# Patient Record
Sex: Male | Born: 1955 | Race: White | Hispanic: No | Marital: Married | Smoking: Former smoker
Health system: Southern US, Community
[De-identification: ages and names within clinical notes are randomized; demographics above are authoritative.]

## PROBLEM LIST (undated history)

## (undated) DIAGNOSIS — I7789 Other specified disorders of arteries and arterioles: Secondary | ICD-10-CM

## (undated) DIAGNOSIS — G4730 Sleep apnea, unspecified: Secondary | ICD-10-CM

## (undated) DIAGNOSIS — Z9889 Other specified postprocedural states: Secondary | ICD-10-CM

## (undated) DIAGNOSIS — G8929 Other chronic pain: Secondary | ICD-10-CM

## (undated) DIAGNOSIS — IMO0001 Reserved for inherently not codable concepts without codable children: Secondary | ICD-10-CM

## (undated) DIAGNOSIS — M1990 Unspecified osteoarthritis, unspecified site: Secondary | ICD-10-CM

## (undated) DIAGNOSIS — R000 Tachycardia, unspecified: Secondary | ICD-10-CM

## (undated) DIAGNOSIS — K219 Gastro-esophageal reflux disease without esophagitis: Secondary | ICD-10-CM

## (undated) DIAGNOSIS — I719 Aortic aneurysm of unspecified site, without rupture: Secondary | ICD-10-CM

## (undated) DIAGNOSIS — I10 Essential (primary) hypertension: Secondary | ICD-10-CM

## (undated) DIAGNOSIS — E785 Hyperlipidemia, unspecified: Secondary | ICD-10-CM

## (undated) DIAGNOSIS — E291 Testicular hypofunction: Secondary | ICD-10-CM

## (undated) DIAGNOSIS — M549 Dorsalgia, unspecified: Secondary | ICD-10-CM

## (undated) DIAGNOSIS — T7840XA Allergy, unspecified, initial encounter: Secondary | ICD-10-CM

## (undated) HISTORY — DX: Reserved for inherently not codable concepts without codable children: IMO0001

## (undated) HISTORY — DX: Sleep apnea, unspecified: G47.30

## (undated) HISTORY — PX: CARDIAC CATHETERIZATION: SHX172

## (undated) HISTORY — DX: Other specified postprocedural states: Z98.890

## (undated) HISTORY — DX: Other chronic pain: G89.29

## (undated) HISTORY — DX: Allergy, unspecified, initial encounter: T78.40XA

## (undated) HISTORY — DX: Testicular hypofunction: E29.1

## (undated) HISTORY — PX: POLYPECTOMY: SHX149

## (undated) HISTORY — PX: COLONOSCOPY: SHX174

## (undated) HISTORY — DX: Aortic aneurysm of unspecified site, without rupture: I71.9

## (undated) HISTORY — DX: Unspecified osteoarthritis, unspecified site: M19.90

## (undated) HISTORY — DX: Hyperlipidemia, unspecified: E78.5

## (undated) HISTORY — DX: Gastro-esophageal reflux disease without esophagitis: K21.9

## (undated) HISTORY — DX: Tachycardia, unspecified: R00.0

## (undated) HISTORY — DX: Dorsalgia, unspecified: M54.9

## (undated) HISTORY — DX: Other specified disorders of arteries and arterioles: I77.89

## (undated) HISTORY — DX: Essential (primary) hypertension: I10

## (undated) HISTORY — PX: CYSTECTOMY: SUR359

---

## 2003-03-04 ENCOUNTER — Encounter: Admission: RE | Admit: 2003-03-04 | Discharge: 2003-03-04 | Payer: Self-pay | Admitting: Internal Medicine

## 2003-11-03 ENCOUNTER — Encounter: Admission: RE | Admit: 2003-11-03 | Discharge: 2003-11-03 | Payer: Self-pay | Admitting: Internal Medicine

## 2003-11-04 ENCOUNTER — Encounter: Admission: RE | Admit: 2003-11-04 | Discharge: 2003-11-04 | Payer: Self-pay | Admitting: Internal Medicine

## 2004-06-02 ENCOUNTER — Ambulatory Visit: Payer: Self-pay | Admitting: Internal Medicine

## 2004-06-08 ENCOUNTER — Ambulatory Visit: Payer: Self-pay

## 2005-04-20 ENCOUNTER — Ambulatory Visit: Payer: Self-pay | Admitting: Internal Medicine

## 2005-05-02 ENCOUNTER — Ambulatory Visit: Payer: Self-pay | Admitting: Internal Medicine

## 2005-08-01 ENCOUNTER — Ambulatory Visit: Payer: Self-pay | Admitting: Internal Medicine

## 2006-04-24 ENCOUNTER — Ambulatory Visit: Payer: Self-pay | Admitting: Internal Medicine

## 2006-04-24 LAB — CONVERTED CEMR LAB
BUN: 19 mg/dL (ref 6–23)
Basophils Absolute: 0.1 10*3/uL (ref 0.0–0.1)
Basophils Relative: 1 % (ref 0.0–1.0)
CO2: 32 meq/L (ref 19–32)
Calcium: 8.8 mg/dL (ref 8.4–10.5)
Chloride: 103 meq/L (ref 96–112)
Cholesterol: 151 mg/dL (ref 0–200)
Creatinine, Ser: 0.9 mg/dL (ref 0.4–1.5)
Creatinine,U: 612.1 mg/dL
Eosinophils Absolute: 0.1 10*3/uL (ref 0.0–0.6)
Eosinophils Relative: 1.1 % (ref 0.0–5.0)
GFR calc Af Amer: 116 mL/min
GFR calc non Af Amer: 96 mL/min
Glucose, Bld: 127 mg/dL — ABNORMAL HIGH (ref 70–99)
HCT: 40.8 % (ref 39.0–52.0)
HDL: 41 mg/dL (ref >39.0)
Hemoglobin: 14.5 g/dL (ref 13.0–17.0)
Hgb A1c MFr Bld: 7.1 % — ABNORMAL HIGH (ref 4.6–6.0)
LDL Cholesterol: 90 mg/dL (ref 0–99)
Lymphocytes Relative: 12.9 % (ref 12.0–46.0)
MCHC: 35.5 g/dL (ref 30.0–36.0)
MCV: 91.8 fL (ref 78.0–100.0)
Microalb Creat Ratio: 3.8 mg/g (ref 0.0–30.0)
Microalb, Ur: 2.3 mg/dL — ABNORMAL HIGH (ref 0.0–1.9)
Monocytes Absolute: 1.4 10*3/uL — ABNORMAL HIGH (ref 0.2–0.7)
Monocytes Relative: 18 % — ABNORMAL HIGH (ref 3.0–11.0)
Neutro Abs: 4.9 10*3/uL (ref 1.4–7.7)
Neutrophils Relative %: 67 % (ref 43.0–77.0)
PSA: 0.5 ng/mL (ref 0.10–4.00)
Platelets: 242 10*3/uL (ref 150–400)
Potassium: 3.9 meq/L (ref 3.5–5.1)
RBC: 4.44 M/uL (ref 4.22–5.81)
RDW: 11.6 % (ref 11.5–14.6)
Sodium: 142 meq/L (ref 135–145)
TSH: 0.77 microintl units/mL (ref 0.35–5.50)
Total CHOL/HDL Ratio: 3.7
Triglycerides: 101 mg/dL (ref 0–149)
VLDL: 20 mg/dL (ref 0–40)
WBC: 7.5 10*3/uL (ref 4.5–10.5)

## 2006-04-30 ENCOUNTER — Ambulatory Visit: Payer: Self-pay | Admitting: Internal Medicine

## 2006-05-06 ENCOUNTER — Ambulatory Visit: Payer: Self-pay | Admitting: Internal Medicine

## 2006-05-06 LAB — CONVERTED CEMR LAB
Basophils Relative: 0.7 % (ref 0.0–1.0)
CO2: 30 meq/L (ref 19–32)
Calcium: 9 mg/dL (ref 8.4–10.5)
Eosinophils Absolute: 0.1 10*3/uL (ref 0.0–0.6)
Eosinophils Relative: 1.2 % (ref 0.0–5.0)
GFR calc Af Amer: 116 mL/min
GFR calc non Af Amer: 96 mL/min
Glucose, Bld: 165 mg/dL — ABNORMAL HIGH (ref 70–99)
HCT: 39.3 % (ref 39.0–52.0)
Hemoglobin: 13.6 g/dL (ref 13.0–17.0)
Lymphocytes Relative: 23.8 % (ref 12.0–46.0)
MCV: 90.9 fL (ref 78.0–100.0)
Neutro Abs: 3.8 10*3/uL (ref 1.4–7.7)
Neutrophils Relative %: 65.3 % (ref 43.0–77.0)
Prothrombin Time: 12.3 s (ref 10.0–14.0)
Sodium: 141 meq/L (ref 135–145)
WBC: 5.8 10*3/uL (ref 4.5–10.5)

## 2006-05-10 ENCOUNTER — Inpatient Hospital Stay (HOSPITAL_BASED_OUTPATIENT_CLINIC_OR_DEPARTMENT_OTHER): Admission: RE | Admit: 2006-05-10 | Discharge: 2006-05-10 | Payer: Self-pay | Admitting: Internal Medicine

## 2006-05-10 ENCOUNTER — Ambulatory Visit: Payer: Self-pay | Admitting: Internal Medicine

## 2006-05-15 ENCOUNTER — Ambulatory Visit: Payer: Self-pay

## 2006-05-15 ENCOUNTER — Encounter: Payer: Self-pay | Admitting: Cardiology

## 2006-05-22 ENCOUNTER — Ambulatory Visit: Payer: Self-pay | Admitting: Internal Medicine

## 2006-07-18 DIAGNOSIS — I10 Essential (primary) hypertension: Secondary | ICD-10-CM | POA: Insufficient documentation

## 2006-07-18 DIAGNOSIS — E785 Hyperlipidemia, unspecified: Secondary | ICD-10-CM | POA: Insufficient documentation

## 2006-07-22 ENCOUNTER — Encounter: Payer: Self-pay | Admitting: Internal Medicine

## 2006-07-22 ENCOUNTER — Ambulatory Visit: Payer: Self-pay | Admitting: Internal Medicine

## 2006-07-22 DIAGNOSIS — E119 Type 2 diabetes mellitus without complications: Secondary | ICD-10-CM | POA: Insufficient documentation

## 2006-07-22 DIAGNOSIS — K219 Gastro-esophageal reflux disease without esophagitis: Secondary | ICD-10-CM | POA: Insufficient documentation

## 2006-07-22 LAB — CONVERTED CEMR LAB
Microalb Creat Ratio: 4.5 mg/g (ref 0.0–30.0)
Microalb, Ur: 0.4 mg/dL (ref 0.0–1.9)

## 2006-11-18 ENCOUNTER — Ambulatory Visit: Payer: Self-pay | Admitting: Internal Medicine

## 2007-01-10 ENCOUNTER — Encounter (INDEPENDENT_AMBULATORY_CARE_PROVIDER_SITE_OTHER): Payer: Self-pay | Admitting: *Deleted

## 2007-02-17 ENCOUNTER — Ambulatory Visit: Payer: Self-pay | Admitting: Internal Medicine

## 2007-02-24 LAB — CONVERTED CEMR LAB
ALT: 24 units/L (ref 0–53)
AST: 17 units/L (ref 0–37)
BUN: 17 mg/dL (ref 6–23)
Calcium: 9.3 mg/dL (ref 8.4–10.5)
Chloride: 104 meq/L (ref 96–112)
GFR calc Af Amer: 115 mL/min
GFR calc non Af Amer: 95 mL/min
Glucose, Bld: 115 mg/dL — ABNORMAL HIGH (ref 70–99)

## 2007-06-16 ENCOUNTER — Telehealth (INDEPENDENT_AMBULATORY_CARE_PROVIDER_SITE_OTHER): Payer: Self-pay | Admitting: *Deleted

## 2007-06-23 ENCOUNTER — Ambulatory Visit: Payer: Self-pay | Admitting: Internal Medicine

## 2007-06-23 ENCOUNTER — Encounter (INDEPENDENT_AMBULATORY_CARE_PROVIDER_SITE_OTHER): Payer: Self-pay | Admitting: *Deleted

## 2007-09-08 ENCOUNTER — Encounter: Payer: Self-pay | Admitting: Internal Medicine

## 2007-10-24 ENCOUNTER — Encounter (INDEPENDENT_AMBULATORY_CARE_PROVIDER_SITE_OTHER): Payer: Self-pay | Admitting: Surgery

## 2007-10-24 ENCOUNTER — Ambulatory Visit (HOSPITAL_COMMUNITY): Admission: RE | Admit: 2007-10-24 | Discharge: 2007-10-24 | Payer: Self-pay | Admitting: Surgery

## 2007-10-28 ENCOUNTER — Encounter: Payer: Self-pay | Admitting: Internal Medicine

## 2007-12-24 ENCOUNTER — Ambulatory Visit: Payer: Self-pay | Admitting: Internal Medicine

## 2007-12-24 LAB — HM DIABETES FOOT EXAM

## 2007-12-25 ENCOUNTER — Encounter (INDEPENDENT_AMBULATORY_CARE_PROVIDER_SITE_OTHER): Payer: Self-pay | Admitting: *Deleted

## 2007-12-26 ENCOUNTER — Telehealth (INDEPENDENT_AMBULATORY_CARE_PROVIDER_SITE_OTHER): Payer: Self-pay | Admitting: *Deleted

## 2008-03-01 ENCOUNTER — Telehealth (INDEPENDENT_AMBULATORY_CARE_PROVIDER_SITE_OTHER): Payer: Self-pay | Admitting: *Deleted

## 2008-03-03 ENCOUNTER — Telehealth (INDEPENDENT_AMBULATORY_CARE_PROVIDER_SITE_OTHER): Payer: Self-pay | Admitting: *Deleted

## 2008-04-26 ENCOUNTER — Ambulatory Visit: Payer: Self-pay | Admitting: Internal Medicine

## 2008-04-27 ENCOUNTER — Ambulatory Visit: Payer: Self-pay | Admitting: Internal Medicine

## 2008-05-03 LAB — CONVERTED CEMR LAB
AST: 18 units/L (ref 0–37)
BUN: 24 mg/dL — ABNORMAL HIGH (ref 6–23)
Calcium: 9.2 mg/dL (ref 8.4–10.5)
GFR calc Af Amer: 115 mL/min
Glucose, Bld: 142 mg/dL — ABNORMAL HIGH (ref 70–99)
Sodium: 143 meq/L (ref 135–145)

## 2008-06-28 ENCOUNTER — Telehealth (INDEPENDENT_AMBULATORY_CARE_PROVIDER_SITE_OTHER): Payer: Self-pay | Admitting: *Deleted

## 2008-07-06 ENCOUNTER — Telehealth (INDEPENDENT_AMBULATORY_CARE_PROVIDER_SITE_OTHER): Payer: Self-pay | Admitting: *Deleted

## 2008-07-08 ENCOUNTER — Encounter (INDEPENDENT_AMBULATORY_CARE_PROVIDER_SITE_OTHER): Payer: Self-pay | Admitting: *Deleted

## 2008-07-27 ENCOUNTER — Telehealth: Payer: Self-pay | Admitting: Internal Medicine

## 2008-10-15 ENCOUNTER — Telehealth (INDEPENDENT_AMBULATORY_CARE_PROVIDER_SITE_OTHER): Payer: Self-pay | Admitting: *Deleted

## 2008-10-20 ENCOUNTER — Telehealth (INDEPENDENT_AMBULATORY_CARE_PROVIDER_SITE_OTHER): Payer: Self-pay | Admitting: *Deleted

## 2008-10-29 ENCOUNTER — Ambulatory Visit: Payer: Self-pay | Admitting: Internal Medicine

## 2008-11-03 ENCOUNTER — Encounter (INDEPENDENT_AMBULATORY_CARE_PROVIDER_SITE_OTHER): Payer: Self-pay | Admitting: *Deleted

## 2008-11-05 ENCOUNTER — Ambulatory Visit: Payer: Self-pay | Admitting: Internal Medicine

## 2008-11-10 LAB — CONVERTED CEMR LAB
Basophils Relative: 0.5 % (ref 0.0–3.0)
Eosinophils Relative: 4.1 % (ref 0.0–5.0)
HDL: 36.4 mg/dL — ABNORMAL LOW (ref >39.00)
Hemoglobin: 14.1 g/dL (ref 13.0–17.0)
Hgb A1c MFr Bld: 6.6 % — ABNORMAL HIGH (ref 4.6–6.5)
Lymphocytes Relative: 24.4 % (ref 12.0–46.0)
Monocytes Relative: 10.6 % (ref 3.0–12.0)
Neutro Abs: 3.7 10*3/uL (ref 1.4–7.7)
PSA: 0.65 ng/mL (ref 0.10–4.00)
RBC: 4.34 M/uL (ref 4.22–5.81)
TSH: 0.88 microintl units/mL (ref 0.35–5.50)
Total CHOL/HDL Ratio: 4
VLDL: 19.4 mg/dL (ref 0.0–40.0)

## 2008-12-03 ENCOUNTER — Ambulatory Visit: Payer: Self-pay | Admitting: Gastroenterology

## 2008-12-17 ENCOUNTER — Encounter: Payer: Self-pay | Admitting: Gastroenterology

## 2008-12-17 ENCOUNTER — Ambulatory Visit: Payer: Self-pay | Admitting: Gastroenterology

## 2008-12-20 ENCOUNTER — Encounter: Payer: Self-pay | Admitting: Gastroenterology

## 2008-12-20 ENCOUNTER — Telehealth (INDEPENDENT_AMBULATORY_CARE_PROVIDER_SITE_OTHER): Payer: Self-pay | Admitting: *Deleted

## 2008-12-27 ENCOUNTER — Telehealth: Payer: Self-pay | Admitting: Gastroenterology

## 2009-04-01 ENCOUNTER — Ambulatory Visit: Payer: Self-pay | Admitting: Internal Medicine

## 2009-04-07 LAB — CONVERTED CEMR LAB
CO2: 25 meq/L (ref 19–32)
Calcium: 8.9 mg/dL (ref 8.4–10.5)
Creatinine, Ser: 0.91 mg/dL (ref 0.40–1.50)
Glucose, Bld: 92 mg/dL (ref 70–99)
Sodium: 139 meq/L (ref 135–145)

## 2009-05-27 ENCOUNTER — Telehealth: Payer: Self-pay | Admitting: Internal Medicine

## 2009-06-22 ENCOUNTER — Ambulatory Visit: Payer: Self-pay | Admitting: Family Medicine

## 2009-06-22 ENCOUNTER — Encounter: Payer: Self-pay | Admitting: Internal Medicine

## 2009-06-22 DIAGNOSIS — J309 Allergic rhinitis, unspecified: Secondary | ICD-10-CM | POA: Insufficient documentation

## 2009-08-09 ENCOUNTER — Telehealth: Payer: Self-pay | Admitting: Internal Medicine

## 2009-11-10 ENCOUNTER — Encounter (INDEPENDENT_AMBULATORY_CARE_PROVIDER_SITE_OTHER): Payer: Self-pay | Admitting: *Deleted

## 2009-11-15 ENCOUNTER — Encounter (INDEPENDENT_AMBULATORY_CARE_PROVIDER_SITE_OTHER): Payer: Self-pay | Admitting: *Deleted

## 2009-12-29 ENCOUNTER — Encounter (INDEPENDENT_AMBULATORY_CARE_PROVIDER_SITE_OTHER): Payer: Self-pay | Admitting: *Deleted

## 2009-12-30 ENCOUNTER — Ambulatory Visit: Payer: Self-pay | Admitting: Gastroenterology

## 2010-01-13 ENCOUNTER — Ambulatory Visit: Payer: Self-pay | Admitting: Gastroenterology

## 2010-01-16 ENCOUNTER — Encounter: Payer: Self-pay | Admitting: Gastroenterology

## 2010-03-09 ENCOUNTER — Ambulatory Visit
Admission: RE | Admit: 2010-03-09 | Discharge: 2010-03-09 | Payer: Self-pay | Source: Home / Self Care | Attending: Internal Medicine | Admitting: Internal Medicine

## 2010-03-16 ENCOUNTER — Telehealth: Payer: Self-pay | Admitting: Internal Medicine

## 2010-03-24 ENCOUNTER — Ambulatory Visit
Admission: RE | Admit: 2010-03-24 | Discharge: 2010-03-24 | Payer: Self-pay | Source: Home / Self Care | Attending: Internal Medicine | Admitting: Internal Medicine

## 2010-03-24 ENCOUNTER — Other Ambulatory Visit: Payer: Self-pay | Admitting: Internal Medicine

## 2010-03-24 ENCOUNTER — Encounter: Payer: Self-pay | Admitting: Internal Medicine

## 2010-03-24 DIAGNOSIS — E291 Testicular hypofunction: Secondary | ICD-10-CM | POA: Insufficient documentation

## 2010-03-24 LAB — CBC WITH DIFFERENTIAL/PLATELET
Basophils Absolute: 0 10*3/uL (ref 0.0–0.1)
Basophils Relative: 0.5 % (ref 0.0–3.0)
Eosinophils Absolute: 0.1 10*3/uL (ref 0.0–0.7)
Eosinophils Relative: 1.1 % (ref 0.0–5.0)
HCT: 41.6 % (ref 39.0–52.0)
Hemoglobin: 14.3 g/dL (ref 13.0–17.0)
Lymphocytes Relative: 19.1 % (ref 12.0–46.0)
Lymphs Abs: 1.6 10*3/uL (ref 0.7–4.0)
MCHC: 34.5 g/dL (ref 30.0–36.0)
MCV: 92.4 fl (ref 78.0–100.0)
Monocytes Absolute: 0.7 10*3/uL (ref 0.1–1.0)
Monocytes Relative: 8.1 % (ref 3.0–12.0)
Neutro Abs: 6 10*3/uL (ref 1.4–7.7)
Neutrophils Relative %: 71.2 % (ref 43.0–77.0)
Platelets: 256 10*3/uL (ref 150.0–400.0)
RBC: 4.5 Mil/uL (ref 4.22–5.81)
RDW: 12.7 % (ref 11.5–14.6)
WBC: 8.5 10*3/uL (ref 4.5–10.5)

## 2010-03-24 LAB — LIPID PANEL
Cholesterol: 163 mg/dL (ref 0–200)
HDL: 41.7 mg/dL (ref >39.00)
LDL Cholesterol: 98 mg/dL (ref 0–99)
Total CHOL/HDL Ratio: 4
Triglycerides: 119 mg/dL (ref 0.0–149.0)
VLDL: 23.8 mg/dL (ref 0.0–40.0)

## 2010-03-24 LAB — PSA: PSA: 0.72 ng/mL (ref 0.10–4.00)

## 2010-03-24 LAB — HEPATIC FUNCTION PANEL
ALT: 26 U/L (ref 0–53)
AST: 22 U/L (ref 0–37)
Albumin: 4.3 g/dL (ref 3.5–5.2)
Alkaline Phosphatase: 60 U/L (ref 39–117)
Bilirubin, Direct: 0.1 mg/dL (ref 0.0–0.3)
Total Bilirubin: 0.8 mg/dL (ref 0.3–1.2)
Total Protein: 7.2 g/dL (ref 6.0–8.3)

## 2010-03-24 LAB — TSH: TSH: 0.61 u[IU]/mL (ref 0.35–5.50)

## 2010-03-29 ENCOUNTER — Encounter: Payer: Self-pay | Admitting: Internal Medicine

## 2010-03-29 LAB — CONVERTED CEMR LAB

## 2010-04-03 ENCOUNTER — Encounter: Payer: Self-pay | Admitting: Internal Medicine

## 2010-04-03 LAB — CONVERTED CEMR LAB
Sex Hormone Binding: 23 nmol/L (ref 13–71)
Testosterone Free: 51 pg/mL (ref 47.0–244.0)
Testosterone-% Free: 2.3 % (ref 1.6–2.9)
Testosterone: 219.42 ng/dL — ABNORMAL LOW (ref 250–890)

## 2010-04-09 LAB — CONVERTED CEMR LAB
ALT: 25 units/L (ref 0–53)
BUN: 21 mg/dL (ref 6–23)
Basophils Relative: 0.2 % (ref 0.0–1.0)
Creatinine, Ser: 0.9 mg/dL (ref 0.4–1.5)
Eosinophils Relative: 1.2 % (ref 0.0–5.0)
GFR calc Af Amer: 115 mL/min
Glucose, Bld: 139 mg/dL — ABNORMAL HIGH (ref 70–99)
Glucose, Urine, Semiquant: NEGATIVE
HCT: 42.9 % (ref 39.0–52.0)
Hemoglobin: 14 g/dL (ref 13.0–17.0)
MCV: 94.6 fL (ref 78.0–100.0)
Microalb Creat Ratio: 2.2 mg/g (ref 0.0–30.0)
Monocytes Absolute: 0.7 10*3/uL (ref 0.1–1.0)
Monocytes Relative: 8.3 % (ref 3.0–12.0)
Neutro Abs: 6.7 10*3/uL (ref 1.4–7.7)
Nitrite: NEGATIVE
PSA: 0.52 ng/mL (ref 0.10–4.00)
Platelets: 267 10*3/uL (ref 150–400)
Potassium: 4 meq/L (ref 3.5–5.1)
RBC: 4.53 M/uL (ref 4.22–5.81)
Specific Gravity, Urine: <1.005
TSH: 0.84 microintl units/mL (ref 0.35–5.50)
Total CHOL/HDL Ratio: 3.8
WBC Urine, dipstick: NEGATIVE
WBC: 9 10*3/uL (ref 4.5–10.5)
pH: 8.5

## 2010-04-11 NOTE — Letter (Signed)
Summary: Pre Visit Letter Revised  [facility]  [address] [place], [place] [address]   Phone: [phone]  Fax: [phone]        11/15/2009 MRN: [id number] [facility] [place] [address] [place], [place]  [address]             Procedure Date:  01/13/2010   Welcome to the Gastroenterology Division at [facility].    You are scheduled to see a nurse for your pre-procedure visit on 12/30/2009 at 9:00AM on the 3rd floor at [facility], [address]  We ask that you try to arrive at our office 15 minutes prior to your appointment time to allow for check-in.  Please take a minute to review the attached form.  If you answer "Yes" to one or more of the questions on the first page, we ask that you call the person listed at your earliest opportunity.  If you answer "No" to all of the questions, please complete the rest of the form and bring it to your appointment.    Your nurse visit will consist of discussing your medical and surgical history, your immediate family medical history, and your medications.   If you are unable to list all of your medications on the form, please bring the medication bottles to your appointment and we will list them.  We will need to be aware of both prescribed and over the counter drugs.  We will need to know exact dosage information as well.    Please be prepared to read and sign documents such as consent forms, a financial agreement, and acknowledgement forms.  If necessary, and with your consent, a friend or relative is welcome to sit-in on the nurse visit with you.  Please bring your insurance card so that we may make a copy of it.  If your insurance requires a referral to see a specialist, please bring your referral form from your primary care physician.  No co-pay is required for this nurse visit.     If you cannot keep your appointment, please call [phone] to cancel or reschedule prior to your appointment date.  This  allows [place] the opportunity to schedule an appointment for another patient in need of care.    Thank you for choosing [facility] for your medical needs.  We appreciate the opportunity to care for you.  Please visit [place] at our website  to learn more about our practice.  Sincerely, The Gastroenterology Division

## 2010-04-11 NOTE — Letter (Signed)
Summary: Colonoscopy Letter  [facility]  [address] [place], [place] [address]   Phone: [phone]  Fax: [phone]      November 10, 2009 MRN: [id number]   [facility] [address] [place], [place]  [address]   Dear Mr. Lenker,   According to your medical record, it is time for you to schedule a Colonoscopy. The American Cancer Society recommends this procedure as a method to detect early colon cancer. Patients with a family history of colon cancer, or a personal history of colon polyps or inflammatory bowel disease are at increased risk.  This letter has beeen generated based on the recommendations made at the time of your procedure. If you feel that in your particular situation this may no longer apply, please contact our office.  Please call our office at [phone] to schedule this appointment or to update your records at your earliest convenience.  Thank you for cooperating with [place] to provide you with the very best care possible.   Sincerely,  Judie Petit T. Russella Dar, M.D.  [facility] Gastroenterology Division [phone]

## 2010-04-11 NOTE — Letter (Signed)
Summary: Moviprep Instructions  [facility]  [address]   [place], [place] [address]   Phone: [phone]  Fax: [phone]       Blayke Bies    08/07/1968    MRN: [id number]        Procedure Day /Date: Friday, 01-13-10     Arrival Time: 8:00 a.m.     Procedure Time: 9:00 a.m.     Location of Procedure:                    x   [facility] (4th Floor)                        PREPARATION FOR COLONOSCOPY WITH MOVIPREP   Starting 5 days prior to your procedure 01-08-10 do not eat nuts, seeds, popcorn, corn, beans, peas,  salads, or any raw vegetables.  Do not take any fiber supplements (e.g. Metamucil, Citrucel, and Benefiber).  THE DAY BEFORE YOUR PROCEDURE         DATE: 01-12-10   DAY: Thursday  1.  Drink clear liquids the entire day-NO SOLID FOOD  2.  Do not drink anything colored red or purple.  Avoid juices with pulp.  No orange juice.  3.  Drink at least 64 oz. (8 glasses) of fluid/clear liquids during the day to prevent dehydration and help the prep work efficiently.  CLEAR LIQUIDS INCLUDE: Water Jello Ice Popsicles Tea (sugar ok, no milk/cream) Powdered fruit flavored drinks Coffee (sugar ok, no milk/cream) Gatorade Juice: apple, white grape, white cranberry  Lemonade Clear bullion, consomm, broth Carbonated beverages (any kind) Strained chicken noodle soup Hard Candy                             4.  In the morning, mix first dose of MoviPrep solution:    Empty 1 Pouch A and 1 Pouch B into the disposable container    Add lukewarm drinking water to the top line of the container. Mix to dissolve    Refrigerate (mixed solution should be used within 24 hrs)  5.  Begin drinking the prep at 5:00 p.m. The MoviPrep container is divided by 4 marks.   Every 15 minutes drink the solution down to the next mark (approximately 8 oz) until the full liter is complete.   6.  Follow completed prep with 16 oz of clear liquid of your choice  (Nothing red or purple).  Continue to drink clear liquids until bedtime.  7.  Before going to bed, mix second dose of MoviPrep solution:    Empty 1 Pouch A and 1 Pouch B into the disposable container    Add lukewarm drinking water to the top line of the container. Mix to dissolve    Refrigerate  THE DAY OF YOUR PROCEDURE      DATE: 01-13-10  DAY: Friday  Beginning at 4:00 a.m. (5 hours before procedure):         1. Every 15 minutes, drink the solution down to the next mark (approx 8 oz) until the full liter is complete.  2. Follow completed prep with 16 oz. of clear liquid of your choice.    3. You may drink clear liquids until 7:00  (2 HOURS BEFORE PROCEDURE).   MEDICATION INSTRUCTIONS  Unless otherwise instructed, you should take regular prescription medications with a small sip of water   as early as possible the  morning of your procedure.  Diabetic patients - see separate instructions.   Additional medication instructions:  Hold HCTZ pill morning of procedure         OTHER INSTRUCTIONS  You will need a responsible adult at least 53 years of age to accompany you and drive you home.   This person must remain in the waiting room during your procedure.  Wear loose fitting clothing that is easily removed.  Leave jewelry and other valuables at home.  However, you may wish to bring a book to read or  an iPod/MP3 player to listen to music as you wait for your procedure to start.  Remove all body piercing jewelry and leave at home.  Total time from sign-in until discharge is approximately 2-3 hours.  You should go home directly after your procedure and rest.  You can resume normal activities the  day after your procedure.  The day of your procedure you should not:   Drive   Make legal decisions   Operate machinery   Drink alcohol   Return to work  You will receive specific instructions about eating, activities and medications before you leave.    The  above instructions have been reviewed and explained to me by  Sherren Kerns RN  December 30, 2009 9:18 AM     I fully understand and can verbalize these instructions _____________________________ Date _________

## 2010-04-11 NOTE — Progress Notes (Signed)
Summary: Refill Request  Phone Note Refill Request Call back at [phone] Message from:  Pharmacy on Aug 09, 2009 8:53 AM  Refills Requested: Medication #1:  AMBIEN 10 MG TABS 1 by mouth at bedtime as needed   Dosage confirmed as above?Dosage Confirmed   Brand Name Necessary? No   [facility]ed[facility] [address]  Next Appointment Scheduled: 6.10.11 Initial call taken by: Harold Barban,  Aug 09, 2009 8:53 AM  Follow-up for Phone Call        #30 with 6 refills on 12/21/08.....Marland KitchenMarland KitchenShary Decamp  Aug 09, 2009 1:02 PM  ok 30 and 3 RF Jose E. Paz MD  Aug 09, 2009 2:17 PM     Prescriptions: AMBIEN 10 MG TABS (ZOLPIDEM TARTRATE) 1 by mouth at bedtime as needed  #30 x 3   Entered by:   Shary Decamp   Authorized by:   Nolon Rod. Paz MD   Signed by:   Shary Decamp on 08/09/2009   Method used:   Printed then faxed to ...       [facility] [phone]* (retail)       [address]       [place], [place]  [address]       Ph: [phone]       Fax: [phone]   RxID:   [id number]

## 2010-04-11 NOTE — Procedures (Signed)
Summary: Colonoscopy   Colonoscopy  Procedure date:  12/17/2008  Findings:      Location:  [facility].    Procedures Next Due Date:    Colonoscopy: 12/2009 COLONOSCOPY PROCEDURE REPORT  PATIENT:  Anthony, [name]  MR#:  [id number] BIRTHDATE:   06-13-1955, 53 yrs. old   GENDER:   male  ENDOSCOPIST:   Judie Petit T. Russella Dar, MD, [facility] Referred by: Willow Ora, M.D.  PROCEDURE DATE:  12/17/2008 PROCEDURE:  Colonoscopy with snare polypectomy, and with submucosal injection ASA CLASS:   Class II INDICATIONS: 1) Routine Risk Screening   MEDICATIONS:    Fentanyl 50 mcg IV, Versed 8 mg IV  DESCRIPTION OF PROCEDURE:   After the risks benefits and alternatives of the procedure were thoroughly explained, informed consent was obtained.  Digital rectal exam was performed and revealed no abnormalities.   The LB PCF-H180AL [id number] endoscope was introduced through the anus and advanced to the cecum, which was identified by both the appendix and ileocecal valve, without limitations.  The quality of the prep was good, using MoviPrep.  The instrument was then slowly withdrawn as the colon was fully examined. <<PROCEDUREIMAGES>>              <<OLD IMAGES>>  FINDINGS:  A sessile polyp was found at the hepatic flexure. It was 2 cm in size and oblong along a fold. IPolyp was snared, then cauterized with monopolar cautery. Piecemeal polypectomy. Not sure if all of the polyp was removed/cauterized. Retrieval was successful. Submucosal injection of 4 cc on [place] Ink in 2 locations adjancent to the polyp. A normal appearing cecum, ileocecal valve, and appendiceal orifice were identified. The ascending, transverse, splenic flexure, descending, sigmoid colon, and rectum appeared unremarkable.   Retroflexed views in the rectum revealed no abnormalities.   The time to cecum =  3  minutes. The scope was then withdrawn (time =  17.5  min) from the patient and the procedure completed.  COMPLICATIONS:    None  ENDOSCOPIC IMPRESSION:  1) 2 cm sessile polyp at the hepatic flexure  RECOMMENDATIONS:  1) No aspirin or NSAID's for 2 weeks  2) Await pathology results  3) Repeat Colonoscopy in 1 year to assess if polypectomy was complete.     Venita Lick. Russella Dar, MD, [facility]        REPORT OF SURGICAL PATHOLOGY   Case #: [id number] Patient Name: Anthony [name], Anthony [name]. Office Chart Number:  N/A [id number] MRN: [id number] Pathologist: Beulah Gandy. Luisa Hart, MD DOB/Age  19-Jul-1955 (Age: 55)    Gender: M Date Taken:  12/17/2008 Date Received: 12/17/2008   FINAL DIAGNOSIS   ***MICROSCOPIC EXAMINATION AND DIAGNOSIS***   COLON, HEPATIC FLEXURE POLYP:  FRAGMENTS OF TUBULAR ADENOMA.  NO HIGH GRADE DYSPLASIA OR MALIGNANCY IDENTIFIED.   gdt Date Reported:  12/20/2008     Beulah Gandy. Luisa Hart, MD *** Electronically Signed Out By JDP ***     December 20, 2008 MRN: [id number]    [facility] [address] [place], [place]  [address]    Dear Mr. Hazzard,  I am pleased to inform you that the colon polyp(s) removed during your recent colonoscopy was (were) found to be benign (no cancer detected) upon pathologic examination.  I recommend you have a repeat colonoscopy examination in 1 year to look for recurrent polyps and assess if polypectomy was complete, as having colon polyps increases your risk for having recurrent polyps or even colon cancer in the future.  Should you develop new or worsening symptoms  of abdominal pain, bowel habit changes or bleeding from the rectum or bowels, please schedule an evaluation with either your primary care physician or with me.  Continue treatment plan as outlined the day of your exam.  Please call us if you are having persistent problems or have questions about your condition that have not been fully answered at this time.  Sincerely,  Meryl Dare MD [facility]  This letter has been electronically signed by your physician.    This report was created from the  original endoscopy report, which was reviewed and signed by the above listed endoscopist.

## 2010-04-11 NOTE — Letter (Signed)
Summary: Patient Notice- Polyp Results  [facility]  [address] [place], [place] [address]   Phone: [phone]  Fax: [phone]        January 16, 2010 MRN: [id number]    [facility] [address] [place], [place]  [address]    Dear Mr. Walczyk,  I am pleased to inform you that the colon polyp(s) removed during your recent colonoscopy was (were) found to be benign (no cancer detected) upon pathologic examination.  I recommend you have a repeat colonoscopy examination in 3 years to look for recurrent polyps, as having colon polyps increases your risk for having recurrent polyps or even colon cancer in the future.  Should you develop new or worsening symptoms of abdominal pain, bowel habit changes or bleeding from the rectum or bowels, please schedule an evaluation with either your primary care physician or with me.  Continue treatment plan as outlined the day of your exam.  Please call us if you are having persistent problems or have questions about your condition that have not been fully answered at this time.  Sincerely,  Meryl Dare MD [facility]  This letter has been electronically signed by your physician.  Appended Document: Patient Notice- Polyp Results letter mailed

## 2010-04-11 NOTE — Miscellaneous (Signed)
Summary: previsit prep/rm  Clinical Lists Changes  Medications: Added new medication of MOVIPREP 100 GM  SOLR (PEG-KCL-NACL-NASULF-NA ASC-C) As per prep instructions. - Signed Rx of MOVIPREP 100 GM  SOLR (PEG-KCL-NACL-NASULF-NA ASC-C) As per prep instructions.;  #1 x 0;  Signed;  Entered by: Sherren Kerns RN;  Authorized by: Meryl Dare MD [facility];  Method used: [facility] [phone]*, [address] [place], [place], [place]  [address], Ph: [phone], Fax: [phone] Observations: Added new observation of ALLERGY REV: Done (12/30/2009 8:43)    Prescriptions: MOVIPREP 100 GM  SOLR (PEG-KCL-NACL-NASULF-NA ASC-C) As per prep instructions.  #1 x 0   Entered by:   Sherren Kerns RN   Authorized by:   Meryl Dare MD [facility]   Signed by:   Sherren Kerns RN on 12/30/2009   Method used:   Electronically to        [facility] [phone]* (retail)       [address]       [place], [place]  [address]       Ph: [phone]       Fax: [phone]   RxID:   [id number]

## 2010-04-11 NOTE — Procedures (Signed)
Summary: Colonoscopy  Patient: Quentyn Kolbeck Note: All result statuses are Final unless otherwise noted.  Tests: (1) Colonoscopy (COL)   COL Colonoscopy           DONE     [facility]     [address]     [place], [place]  [address]           COLONOSCOPY PROCEDURE REPORT     PATIENT:  [name], Anthony [name]  MR#:  [id number]     BIRTHDATE:  September 02, 1955, 54 yrs. old  GENDER:  male     ENDOSCOPIST:  Judie Petit T. Russella Dar, MD, [facility]           PROCEDURE DATE:  01/13/2010     PROCEDURE:  Colonoscopy with snare polypectomy     ASA CLASS:  Class II     INDICATIONS:  1) surveillance and high-risk screening  2) history     of adenomatous colon polyps     MEDICATIONS:   Fentanyl 75 mcg IV, Versed 6 mg IV     DESCRIPTION OF PROCEDURE:   After the risks benefits and     alternatives of the procedure were thoroughly explained, informed     consent was obtained.  Digital rectal exam was performed and     revealed no abnormalities.   The LB PCF-H180AL [id number] endoscope     was introduced through the anus and advanced to the cecum, which     was identified by both the appendix and ileocecal valve, without     limitations.  The quality of the prep was good, using MoviPrep.     The instrument was then slowly withdrawn as the colon was fully     examined.     <<PROCEDUREIMAGES>>     FINDINGS:  A sessile polyp was found at the hepatic flexure. It     was 6 mm in size. Tattoo was noted at the polyp site. Polyp was     snared without cautery. Retrieval was successful. A normal     appearing cecum, ileocecal valve, and appendiceal orifice were     identified. The ascending, transverse, splenic flexure,     descending, sigmoid colon, and rectum appeared unremarkable.     Retroflexed views in the rectum revealed internal hemorrhoids,     small.  The time to cecum =  2.75  minutes. The scope was then     withdrawn (time =  11  min) from the patient and the procedure     completed.     COMPLICATIONS:  None             ENDOSCOPIC IMPRESSION:     1) 6 mm sessile polyp with tattoo at the hepatic flexure     2) Internal hemorrhoids     RECOMMENDATIONS:     1) Await pathology results     2) Repeat Colonoscopy in 3 years.           Venita Lick. Russella Dar, MD, [facility]           CC: Willow Ora, MD           n.     Rosalie DoctorVenita Lick. Ketina Mars at 01/13/2010 09:45 AM           Havens, Amada Jupiter, [id number]  Note: An exclamation mark (!) indicates a result that was not dispersed into the flowsheet. Document Creation Date: 01/13/2010 9:45 AM _______________________________________________________________________  (1) Order result status: Final Collection or observation date-time: 01/13/2010  09:30 Requested date-time:  Receipt date-time:  Reported date-time:  Referring Physician:   Ordering Physician: Claudette Head [id number]) Specimen Source:  Source: Launa Grill Order Number: [id number] Lab site:   Appended Document: Colonoscopy     Procedures Next Due Date:    Colonoscopy: 01/2013

## 2010-04-11 NOTE — Assessment & Plan Note (Signed)
Summary: 4 MTH FU/NS/KDC   Vital Signs:  Patient profile:   55 year old male Height:      75 inches Weight:      271.4 pounds Pulse rate:   74 / minute BP sitting:   130 / 90  Vitals Entered By: Shary Decamp (April 01, 2009 2:57 PM) CC: rov - fasting Is Patient Diabetic? Yes Comments  - patient has not been checking blood sugars @ home [facility]  April 01, 2009 3:01 PM    History of Present Illness: AODM -- the patient remains very active, nor recent CBGs HYPERLIPIDEMIA  -- good medication compliance  HYPERTENSION  -- seldom check  ambulatory BPs but usually ok    Current Medications (verified): 1)  Hydrochlorothiazide 25 Mg Tabs (Hydrochlorothiazide) .... Take 1 Tab Qd 2)  Baby Aspirin 81 Mg Chew (Aspirin) .... Take 1 Tablet By Mouth Once A Day 3)  Actos 45 Mg Tabs (Pioglitazone Hcl) .Marland Kitchen.. 1 Tablet By Mouth Once A Day 4)  Vytorin 10-40 Mg  Tabs (Ezetimibe-Simvastatin) .... Take One Half Tab Daily 5)  Cozaar 100 Mg  Tabs (Losartan Potassium) .... 1/2 Tab Qd 6)  Januvia 100 Mg Tabs (Sitagliptin Phosphate) .Marland Kitchen.. 1 By Mouth Qd 7)  Onetouch Ultra Test  Strp (Glucose Blood) .... Use One Strip Twice Daily. 8)  Ambien 10 Mg Tabs (Zolpidem Tartrate) .Marland Kitchen.. 1 By Mouth At Bedtime As Needed  Allergies (verified): No Known Drug Allergies  Past History:  Past Medical History: AODM  HYPERLIPIDEMIA   HYPERTENSION   GERD  DEGENERATIVE JOINT DISEASE per MRI of LS spine 2005 (-) stress test 2003, 2006 Cardiac cath 04-2006 neg per pt--CP improved w/ PPIs BACK PAIN, CHRONIC--much better as of 2009  Past Surgical History: Reviewed history from 10/29/2008 and no changes required. s/p cyst removal from the back  (2009)  Social History: Reviewed history from 10/29/2008 and no changes required. 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 Resp:  Denies cough and shortness of breath.  Physical Exam  General:  alert and well-developed.   Lungs:  normal respiratory effort, no intercostal retractions, no accessory muscle use, and normal breath sounds.   Heart:  normal rate, regular rhythm, no murmur, and no gallop.     Impression & Recommendations:  Problem # 1:  AODM (ICD-250.00) due for labs,cont present care  His updated medication list for this problem includes:    Baby Aspirin 81 Mg Chew (Aspirin) .Marland Kitchen... Take 1 tablet by mouth once a day    Actos 45 Mg Tabs (Pioglitazone hcl) .Marland Kitchen... 1 tablet by mouth once a day    Cozaar 100 Mg Tabs (Losartan potassium) .Marland Kitchen... 1/2 tab qd    Januvia 100 Mg Tabs (Sitagliptin phosphate) .Marland Kitchen... 1 by mouth qd  Labs Reviewed: Creat: 0.9 (04/27/2008)     Last Eye Exam: normal (04/12/2008) Reviewed HgBA1c results: 6.6 (11/05/2008)  7.0 (04/27/2008)  Problem # 2:  HYPERTENSION (ICD-401.9) BP slightlyelevated today, but usually okay.  Seen instructions, labs His updated medication list for this problem includes:    Hydrochlorothiazide 25 Mg Tabs (Hydrochlorothiazide) .Marland Kitchen... Take 1 tab qd    Cozaar 100 Mg Tabs (Losartan potassium) .Marland Kitchen... 1/2 tab qd  Orders: Venipuncture ([address])  BP today: 130/90 Prior BP: 110/70 (10/29/2008)  Labs Reviewed: K+: 4.0 (04/27/2008) Creat: :  0.9 (04/27/2008)   Chol: 135 (11/05/2008)   HDL: 36.40 (11/05/2008)   LDL: 79 (11/05/2008)   TG: 97.0 (11/05/2008)  Complete Medication List: 1)  Hydrochlorothiazide 25 Mg Tabs (Hydrochlorothiazide) .... Take 1 tab qd 2)  Baby Aspirin 81 Mg Chew (Aspirin) .... Take 1 tablet by mouth once a day 3)  Actos 45 Mg Tabs (Pioglitazone hcl) .Marland Kitchen.. 1 tablet by mouth once a day 4)  Vytorin 10-40 Mg Tabs (Ezetimibe-simvastatin) .... Take one half tab daily 5)  Cozaar 100 Mg Tabs (Losartan potassium) .... 1/2 tab qd 6)  Januvia 100 Mg Tabs (Sitagliptin phosphate) .Marland Kitchen.. 1 by mouth  qd 7)  Onetouch Ultra Test Strp (Glucose blood) .... Use one strip twice daily. 8)  Ambien 10 Mg Tabs (Zolpidem tartrate) .Marland Kitchen.. 1 by mouth at bedtime as needed  Patient Instructions: 1)  Please schedule a follow-up appointment in 4 months .  2)  Check your blood pressure 2 or 3 times a week. If it is more than 140/85 consistently,please let us know    Immunization History:  Influenza Immunization History:    Influenza:  historical (12/10/2008)

## 2010-04-11 NOTE — Miscellaneous (Signed)
Summary: neg DM eye exam   Clinical Lists Changes  Observations: Added new observation of DMEYEEXAMNXT: 06/2010 (06/22/2009 15:01) Added new observation of DMEYEEXMRES: normal (06/22/2009 15:01) Added new observation of EYE EXAM BY: digby (06/22/2009 15:01) Added new observation of DIAB EYE EX: normal (06/22/2009 15:01)       Diabetes Management Exam:    Eye Exam:       Eye Exam done elsewhere          Date: 06/22/2009          Results: normal          Done by: digby

## 2010-04-11 NOTE — Letter (Signed)
Summary: Diabetic Instructions  [facility]  [address]   [place], [place] [address]   Phone: [phone]  Fax: [phone]    Liahm Monahan 1955-06-18 MRN: [id number]    x    ORAL DIABETIC MEDICATION INSTRUCTIONS  The day before your procedure:   Take your diabetic pill as you do normally  The day of your procedure:   Do not take your diabetic pill    We will check your blood sugar levels during the admission process and again in Recovery before discharging you home  ________________________________________________________________________

## 2010-04-11 NOTE — Letter (Signed)
Summary: [facility]--no  retinopathy  [facility]   Imported By: Lanelle Bal 06/28/2009 09:20:09  _____________________________________________________________________  External Attachment:    Type:   Image     Comment:   External Document

## 2010-04-11 NOTE — Assessment & Plan Note (Signed)
Summary: FOR EAR INFECTION--PH   Vital Signs:  Patient profile:   55 year old male Weight:      272 pounds Temp:     97.1 degrees F oral BP sitting:   112 / 76  (left arm)  Vitals Entered By: Doristine Devoid (June 22, 2009 2:10 PM) CC: pressure in both ears for few weeks Comments -went to urgent care sometime ago given meds says they helped but not sure what was given    History of Present Illness: 55 yo man here today for ear pressure.  sxs most noticeable over the last 3-4 days.  no drainage.  pain worst in the AM.  had ear infxn a few weeks ago and had unnamed abx and nasal spray.  no fevers, sore throat, nasal congestion.  Current Medications (verified): 1)  Hydrochlorothiazide 25 Mg Tabs (Hydrochlorothiazide) .... Take 1 Tab Qd 2)  Baby Aspirin 81 Mg Chew (Aspirin) .... Take 1 Tablet By Mouth Once A Day 3)  Actos 45 Mg Tabs (Pioglitazone Hcl) .Marland Kitchen.. 1 Tablet By Mouth Once A Day 4)  Vytorin 10-40 Mg  Tabs (Ezetimibe-Simvastatin) .... Take One Half Tab Daily 5)  Cozaar 100 Mg  Tabs (Losartan Potassium) .... 1/2 Tab Qd 6)  Januvia 100 Mg Tabs (Sitagliptin Phosphate) .Marland Kitchen.. 1 By Mouth Qd 7)  Onetouch Ultra Test  Strp (Glucose Blood) .... Use One Strip Twice Daily. 8)  Ambien 10 Mg Tabs (Zolpidem Tartrate) .Marland Kitchen.. 1 By Mouth At Bedtime As Needed 9)  Pantoprazole Sodium 40 Mg Tbec (Pantoprazole Sodium) .... Take 1 Tab Once Daily 10)  Fluticasone Propionate 50 Mcg/act  Susp (Fluticasone Propionate) .... 2 Sprays Each Nostril Once Daily  Allergies (verified): No Known Drug Allergies  Review of Systems      See HPI  Physical Exam  General:  alert and well-developed.   Head:  NCAT, no TTP over sinuses Eyes:  no injxn or inflammation Ears:  TMs retracted bilaterally Nose:  marked turbinate edema Mouth:  +PND Neck:  no LAD   Impression & Recommendations:  Problem # 1:  RHINITIS (ICD-477.9) Assessment New pt's ear sxs are likely eustachian tube dysfxn caused by seasonal allergies.   continue nasal spray, add OTC antihistamine and short term decongestant.  pt to report if sxs don't improve.  no evidence of infxn- no need for abx. His updated medication list for this problem includes:    Fluticasone Propionate 50 Mcg/act Susp (Fluticasone propionate) .Marland Kitchen... 2 sprays each nostril once daily  Complete Medication List: 1)  Hydrochlorothiazide 25 Mg Tabs (Hydrochlorothiazide) .... Take 1 tab qd 2)  Baby Aspirin 81 Mg Chew (Aspirin) .... Take 1 tablet by mouth once a day 3)  Actos 45 Mg Tabs (Pioglitazone hcl) .Marland Kitchen.. 1 tablet by mouth once a day 4)  Vytorin 10-40 Mg Tabs (Ezetimibe-simvastatin) .... Take one half tab daily 5)  Cozaar 100 Mg Tabs (Losartan potassium) .... 1/2 tab qd 6)  Januvia 100 Mg Tabs (Sitagliptin phosphate) .Marland Kitchen.. 1 by mouth qd 7)  Onetouch Ultra Test Strp (Glucose blood) .... Use one strip twice daily. 8)  Ambien 10 Mg Tabs (Zolpidem tartrate) .Marland Kitchen.. 1 by mouth at bedtime as needed 9)  Pantoprazole Sodium 40 Mg Tbec (Pantoprazole sodium) .... Take 1 tab once daily 10)  Fluticasone Propionate 50 Mcg/act Susp (Fluticasone propionate) .... 2 sprays each nostril once daily  Patient Instructions: 1)  There is no evidence of infection in your ears 2)  This is likely all related to seasonal allergies 3)  Continue the nasal spray daily 4)  Start short term Sudafed or Coricidin HBP- no more than 5-7 days 5)  Add a Claritin or Zyrtec daily during allergy season 6)  Hang in there! Prescriptions: FLUTICASONE PROPIONATE 50 MCG/ACT  SUSP (FLUTICASONE PROPIONATE) 2 sprays each nostril once daily  #1 x 3   Entered and Authorized by:   Neena Rhymes MD   Signed by:   Neena Rhymes MD on 06/22/2009   Method used:   [facility]        [phone][facility]retail[facility]       [address]       [place], [place]  [address]       Ph: [phone]       Fax: [phone]   RxID:   [id number]

## 2010-04-11 NOTE — Progress Notes (Signed)
Summary: med concerns  Phone Note From Pharmacy   Caller[facility] Summary of Call: [facility] left VM  requesting a refill on pantoprazole 40mg  1 tab qd  . call back at [phone] opt 2 reference #[phone]  Follow-up for Phone Call        spoke with pt rx originally rx by cardiologist dr. Gala Romney for heart burn. pt states that he contact them first but was told that his PCP needs to rx med.Pls advise ok with filling med for pt.............Marland KitchenFelecia Deloach CMA  May 27, 2009 4:49 PM   Additional Follow-up for Phone Call Additional follow up Details #1::        ok to RF x 6 months  Additional Follow-up by: Denisha Hoel E. Isidora Laham MD,  May 28, 2009 7:42 PM    New/Updated Medications: PANTOPRAZOLE SODIUM 40 MG TBEC (PANTOPRAZOLE SODIUM) Take 1 tab once daily Prescriptions: PANTOPRAZOLE SODIUM 40 MG TBEC (PANTOPRAZOLE SODIUM) Take 1 tab once daily  #90 x 1   Entered by:   Jeremy Johann CMA   Authorized by:   Nolon Rod. Ishmail Mcmanamon MD   Signed by:   Jeremy Johann CMA on 05/30/2009   Method used:   Telephoned to ...       [facility]* (mail-order)             ,          Ph: [phone]       Fax: [phone]   RxID:   [id number]

## 2010-04-13 NOTE — Assessment & Plan Note (Signed)
Summary: FASTING CPX/KB   Vital Signs:  Patient profile:   55 year old male Weight:      269 pounds Pulse rate:   78 / minute Pulse rhythm:   regular BP sitting:   126 / 86  (left arm) Cuff size:   large  Vitals Entered By: Army Fossa CMA (March 24, 2010 11:30 AM) CC: CPX, fasting  Comments feeling pressure in [facility] [address]   History of Present Illness: CPX  last year, he was feeling really tired, went to another practitioner, was diagnosed with hypogonadism. He took hormonal shots for a few months, discontinued shots 5 months ago. Feels slightly tired again. see  review of systems  Preventive Screening-Counseling & Management  Caffeine-Diet-Exercise     Does Patient Exercise: yes  Current Medications (verified): 1)  Hydrochlorothiazide 25 Mg Tabs (Hydrochlorothiazide) .... Take 1 Tab Qd 2)  Baby Aspirin 81 Mg Chew (Aspirin) .... Take 1 Tablet By Mouth Once A Day 3)  Actos 45 Mg Tabs (Pioglitazone Hcl) .Marland Kitchen.. 1 Tablet By Mouth Once A Day 4)  Vytorin 10-40 Mg  Tabs (Ezetimibe-Simvastatin) .... Take One Half Tab Daily 5)  Cozaar 100 Mg  Tabs (Losartan Potassium) .... 1/2 Tab Qd 6)  Januvia 100 Mg Tabs (Sitagliptin Phosphate) .Marland Kitchen.. 1 By Mouth Qd 7)  Onetouch Ultra Test  Strp (Glucose Blood) .... Use One Strip Twice Daily. 8)  Ambien 10 Mg Tabs (Zolpidem Tartrate) .Marland Kitchen.. 1 By Mouth At Bedtime As Needed 9)  Pantoprazole Sodium 40 Mg Tbec (Pantoprazole Sodium) .... Take 1 Tab Once Daily  Allergies (verified): No Known Drug Allergies  Past History:  Past Medical History: AODM  HYPERLIPIDEMIA   HYPERTENSION   GERD  DEGENERATIVE JOINT DISEASE per MRI of LS spine 2005 (-) stress test 2003, 2006 Cardiac cath 04-2006 neg per pt--CP improved w/ PPIs BACK PAIN, CHRONIC--much better as of 2009 dx w/ hypogonadism elsewhere  ~ 2011  Past Surgical History: Reviewed history from 10/29/2008 and no changes required. 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 CV:  Denies chest pain or discomfort and swelling of feet. Resp:  Denies cough and wheezing. GI:  Denies bloody stools, nausea, and vomiting. GU:  Denies urinary frequency and urinary hesitancy. Endo:  occasionally " hot flashes ", no assoc. HA-CP libido--ok no ED .  Physical Exam  General:  alert and well-developed.   Neck:  no masses, no thyromegaly, and normal carotid upstroke.   Lungs:  normal respiratory effort, no intercostal retractions, and no accessory muscle use.  few rhonchi with cough Heart:  normal rate, regular rhythm, no murmur, and no gallop.   Abdomen:  soft, non-tender, no distention, no masses, no guarding, and no rigidity.   Rectal:  No external abnormalities noted. Normal sphincter tone. No rectal masses or tenderness. Prostate:  Prostate gland firm and smooth, no enlargement, nodularity, tenderness, mass, asymmetry or induration. Pulses:  normal pedal pulses bilaterally  Extremities:  no pretibial edema bilaterally  Psych:  Oriented X3, memory intact for  recent and remote, normally interactive, good eye contact, not anxious appearing, and not depressed appearing.     Impression & Recommendations:  Problem # 1:  HEALTH SCREENING (ICD-V70.0) Td  10-2008 recommend a flu shot at his pharmacy  [facility] 11-11, next 01-2013   diet  and exercise discussed Labs  Orders: Venipuncture (54098) TLB-CBC Platelet - w/Differential (85025-CBCD) TLB-Hepatic/Liver Function Pnl  (80076-HEPATIC) TLB-PSA (Prostate Specific Antigen) (84153-PSA) TLB-TSH (Thyroid Stimulating Hormone) (84443-TSH) T- * Misc. Laboratory test [phone])  Problem # 2:  HYPOGONADISM (ICD-257.2) Assessment: New diagnosed with hypogonadism a year ago, took hormones for a few months. Off HRT for 5 months.  Request labs.  Complete Medication List: 1)  Hydrochlorothiazide 25 Mg Tabs (Hydrochlorothiazide) .... Take 1 tab qd 2)  Baby Aspirin 81 Mg Chew (Aspirin) .... Take 1 tablet by mouth once a day 3)  Actos 45 Mg Tabs (Pioglitazone hcl) .Marland Kitchen.. 1 tablet by mouth once a day 4)  Vytorin 10-40 Mg Tabs (Ezetimibe-simvastatin) .... Take one half tab daily 5)  Cozaar 100 Mg Tabs (Losartan potassium) .... 1/2 tab qd 6)  Januvia 100 Mg Tabs (Sitagliptin phosphate) .Marland Kitchen.. 1 by mouth qd 7)  Onetouch Ultra Test Strp (Glucose blood) .... Use one strip twice daily. 8)  Ambien 10 Mg Tabs (Zolpidem tartrate) .Marland Kitchen.. 1 by mouth at bedtime as needed 9)  Pantoprazole Sodium 40 Mg Tbec (Pantoprazole sodium) .... Take 1 tab once daily  Other Orders: TLB-Lipid Panel (80061-LIPID) Specimen Handling (78295)  Patient Instructions: 1)  Please schedule a follow-up appointment in 3 months .    Orders Added: 1)  Venipuncture [36415] 2)  TLB-CBC Platelet - w/Differential [85025-CBCD] 3)  TLB-Hepatic/Liver Function Pnl [80076-HEPATIC] 4)  TLB-Lipid Panel [80061-LIPID] 5)  TLB-PSA (Prostate Specific Antigen) [84153-PSA] 6)  TLB-TSH (Thyroid Stimulating Hormone) [84443-TSH] 7)  Specimen Handling [99000] 8)  T- * Misc. Laboratory test [99999] 9)  Est. Patient age 40-64 [96] 35)  Est. Patient Level II [99212]     Risk Factors:  Alcohol use:  yes Exercise:  yes

## 2010-04-13 NOTE — Progress Notes (Signed)
Summary: Check on pt  ---- Converted from flag ---- ---- 03/15/2010 4:12 PM, Army Fossa CMA wrote: left message for pt.  ---- 03/09/2010 2:48 PM, Jose E. Paz MD wrote: please check on the patient, better? ------------------------------  I spoke with the patient and he is much better, he is sure he will be completely fine by the time he finishes his ABX. [name] CMA  March 16, 2010 8:16 AM

## 2010-04-13 NOTE — Assessment & Plan Note (Signed)
Summary: sinus/bronchitis/dizziness//kn   Vital Signs:  Patient profile:   55 year old male Height:      75 inches Weight:      266.13 pounds BMI:     33.38 Temp:     97.9 degrees F oral Pulse rate:   78 / minute Pulse rhythm:   regular BP sitting:   138 / 88  (left arm) Cuff size:   large  Vitals Entered By: Army Fossa CMA (March 09, 2010 10:02 AM) CC: Pt here c/o HA, nausea, dizzy.  Comments started at 430 am [facility]  fasting    History of Present Illness: last week had cough, sinus - chest congestion, chills He felt better this week. This morning he woke up and he was lightheaded, had mild headache (frontal). + nausea and started vomiting  at the time of this visit overall dizzines  is decreasing and  is feeling slightly better after he vomited   Review of systems  + subjective fever No diplopia, no slurred speech no diarrhea still has chest congestion    Current Medications (verified): 1)  Hydrochlorothiazide 25 Mg Tabs (Hydrochlorothiazide) .... Take 1 Tab Qd 2)  Baby Aspirin 81 Mg Chew (Aspirin) .... Take 1 Tablet By Mouth Once A Day 3)  Actos 45 Mg Tabs (Pioglitazone Hcl) .Marland Kitchen.. 1 Tablet By Mouth Once A Day 4)  Vytorin 10-40 Mg  Tabs (Ezetimibe-Simvastatin) .... Take One Half Tab Daily 5)  Cozaar 100 Mg  Tabs (Losartan Potassium) .... 1/2 Tab Qd 6)  Januvia 100 Mg Tabs (Sitagliptin Phosphate) .Marland Kitchen.. 1 By Mouth Qd 7)  Onetouch Ultra Test  Strp (Glucose Blood) .... Use One Strip Twice Daily. 8)  Ambien 10 Mg Tabs (Zolpidem Tartrate) .Marland Kitchen.. 1 By Mouth At Bedtime As Needed 9)  Pantoprazole Sodium 40 Mg Tbec (Pantoprazole Sodium) .... Take 1 Tab Once Daily  Allergies (verified): No Known Drug Allergies  Past History:  Past Medical History: Reviewed history from 04/01/2009 and no changes required. AODM  HYPERLIPIDEMIA   HYPERTENSION   GERD  DEGENERATIVE JOINT DISEASE per MRI of LS spine 2005 (-) stress test 2003, 2006 Cardiac cath 04-2006 neg per  pt--CP improved w/ PPIs BACK PAIN, CHRONIC--much better as of 2009  Past Surgical History: Reviewed history from 10/29/2008 and no changes required. s/p cyst removal from the back  (2009)  Social History: Reviewed history from 10/29/2008 and no changes required. 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  nontoxic Head:  face symmetric, tender @  both maxillary sinuses Eyes:  EOMI. Pupils equal and reactive Ears:  tympanic membranes bulged R>L , slightly red. No discharge Nose:  congested Mouth:  no redness or discharge Lungs:  normal respiratory effort, no intercostal retractions, and no accessory muscle use.  few rhonchi with cough Heart:  normal rate, regular rhythm, no murmur, and no gallop.   Neurologic:  alert & oriented X3, cranial nerves II-XII intact, strength normal in all extremities, and gait normal.  Romberg absent   Impression & Recommendations:  Problem # 1:  DIZZINESS (ICD-780.4)  dizziness, nausea, vomiting in the setting of acute sinusitis. Neurological exam normal Will treat sinusitis with antibiotics plus symptomatic treatment ----> see instructions  If no better, he needs to call.  His updated medication list for this problem includes:    Promethazine Hcl 12.5 Mg Tabs (Promethazine  hcl) ..... One or 2 every 6 hours as needed for nausea  Problem # 2:  SINUSITIS - ACUTE-NOS (ICD-461.9) see #1     Complete Medication List: 1)  Hydrochlorothiazide 25 Mg Tabs (Hydrochlorothiazide) .... Take 1 tab qd 2)  Baby Aspirin 81 Mg Chew (Aspirin) .... Take 1 tablet by mouth once a day 3)  Actos 45 Mg Tabs (Pioglitazone hcl) .Marland Kitchen.. 1 tablet by mouth once a day 4)  Vytorin 10-40 Mg Tabs (Ezetimibe-simvastatin) .... Take one half tab daily 5)  Cozaar 100 Mg Tabs (Losartan potassium) .... 1/2 tab qd 6)   Januvia 100 Mg Tabs (Sitagliptin phosphate) .Marland Kitchen.. 1 by mouth qd 7)  Onetouch Ultra Test Strp (Glucose blood) .... Use one strip twice daily. 8)  Ambien 10 Mg Tabs (Zolpidem tartrate) .Marland Kitchen.. 1 by mouth at bedtime as needed 9)  Pantoprazole Sodium 40 Mg Tbec (Pantoprazole sodium) .... Take 1 tab once daily 10)  Amoxicillin 500 Mg Tabs (Amoxicillin) .... 2 by mouth twice a day for 10 days 11)  Promethazine Hcl 12.5 Mg Tabs (Promethazine hcl) .... One or 2 every 6 hours as needed for nausea  Patient Instructions: 1)  rest, fluids, Tylenol 2)  Mucinex DM as needed for  cough 3)  Start amoxicillin 4)  Phenergan as needed for nausea 5)  if you feel a lot worse, high fever, increased dizziness or headache---->  go to the ER 6)  call if not improving in a few days Prescriptions: PROMETHAZINE HCL 12.5 MG TABS (PROMETHAZINE HCL) One or 2 every 6 hours as needed for nausea  #20 x 0   Entered and Authorized by:   Nolon Rod. Paz MD   Signed by:   Nolon Rod. Paz MD on 03/09/2010   Method used:   [facility]        [facility] [phone][facility] (retail)       [address]       [place], [place]  [address]       Ph: [phone]       Fax: [phone]   RxID:   [id number] AMOXICILLIN 500 MG TABS (AMOXICILLIN) 2 by mouth twice a day for 10 days  #40 x 0   Entered and Authorized by:   Nolon Rod. Paz MD   Signed by:   Nolon Rod. Paz MD on 03/09/2010   Method used:   Electronically to        [facility] [phone]* (retail)       [address]       [place], [place]  [address]       Ph: [phone]       Fax: [phone]   RxID:   [id number]    Orders Added: 1)  Est. Patient Level III [30160]

## 2010-04-14 ENCOUNTER — Telehealth: Payer: Self-pay | Admitting: Internal Medicine

## 2010-04-19 NOTE — Progress Notes (Addendum)
Summary: Labs request  Phone Note Call from Patient   Summary of Call: Patient left message on triage requesting a copy of his labs.  I spoke with the patient and he will come by up front later and have the front staff print it for him. Initial call taken by: [name] CMA,  April 14, 2010 9:34 AM     Appended Document: Labs request pt came in requesting a copy of most recent labs. labs printed and given to patient .[name]  April 28, 2010 2:40 PM   Thanks. [name] CMA  April 28, 2010 2:41 PM

## 2010-05-23 LAB — GLUCOSE, CAPILLARY: Glucose-Capillary: 123 mg/dL — ABNORMAL HIGH (ref 70–99)

## 2010-05-26 ENCOUNTER — Encounter: Payer: Self-pay | Admitting: Internal Medicine

## 2010-05-26 ENCOUNTER — Other Ambulatory Visit: Payer: Self-pay | Admitting: Internal Medicine

## 2010-05-26 ENCOUNTER — Ambulatory Visit (INDEPENDENT_AMBULATORY_CARE_PROVIDER_SITE_OTHER): Payer: BC Managed Care – PPO | Admitting: Internal Medicine

## 2010-05-26 DIAGNOSIS — I10 Essential (primary) hypertension: Secondary | ICD-10-CM

## 2010-05-26 DIAGNOSIS — E119 Type 2 diabetes mellitus without complications: Secondary | ICD-10-CM

## 2010-05-26 DIAGNOSIS — E291 Testicular hypofunction: Secondary | ICD-10-CM

## 2010-05-26 LAB — BASIC METABOLIC PANEL
BUN: 21 mg/dL (ref 6–23)
Creatinine, Ser: 0.9 mg/dL (ref 0.4–1.5)
GFR: 93.93 mL/min (ref >60.00)
Potassium: 3.9 mEq/L (ref 3.5–5.1)

## 2010-05-26 LAB — HEMOGLOBIN A1C: Hgb A1c MFr Bld: 6.9 % — ABNORMAL HIGH (ref 4.6–6.5)

## 2010-05-30 NOTE — Assessment & Plan Note (Signed)
Summary: 3 month ov/ph   Vital Signs:  Patient profile:   55 year old male Weight:      268.38 pounds Pulse rate:   82 / minute Pulse rhythm:   regular BP sitting:   124 / 86  (left arm) Cuff size:   large  Vitals Entered By: Army Fossa CMA (May 26, 2010 11:04 AM) CC: 3 month f/u- fasting  [facility]    History of Present Illness: doing well all previous labs reviewed w/ patient  ROS ambulatory CBGs in the 130-140, posprandial sometimes 180 occasionally in the 90 range, rarely w/  symptoms , no near syncope  no dizzines no LE edema  diet-- good most of the time, packing his lunch   Current Medications (verified): 1)  Hydrochlorothiazide 25 Mg Tabs (Hydrochlorothiazide) .... Take 1 Tab Qd 2)  Baby Aspirin 81 Mg Chew (Aspirin) .... Take 1 Tablet By Mouth Once A Day 3)  Actos 45 Mg Tabs (Pioglitazone Hcl) .Marland Kitchen.. 1 Tablet By Mouth Once A Day 4)  Vytorin 10-40 Mg  Tabs (Ezetimibe-Simvastatin) .... Take One Half Tab Daily 5)  Cozaar 100 Mg  Tabs (Losartan Potassium) .... 1/2 Tab Qd 6)  Januvia 100 Mg Tabs (Sitagliptin Phosphate) .Marland Kitchen.. 1 By Mouth Qd 7)  Onetouch Ultra Test  Strp (Glucose Blood) .... Use One Strip Twice Daily. 8)  Ambien 10 Mg Tabs (Zolpidem Tartrate) .Marland Kitchen.. 1 By Mouth At Bedtime As Needed 9)  Pantoprazole Sodium 40 Mg Tbec (Pantoprazole Sodium) .... Take 1 Tab Once Daily  Allergies (verified): No Known Drug Allergies  Past History:  Past Medical History: Reviewed history from 03/24/2010 and no changes required. AODM  HYPERLIPIDEMIA   HYPERTENSION   GERD  DEGENERATIVE JOINT DISEASE per MRI of LS spine 2005 (-) stress test 2003, 2006 Cardiac cath 04-2006 neg per pt--CP improved w/ PPIs BACK PAIN, CHRONIC--much better as of 2009 dx w/ hypogonadism elsewhere  ~ 2011  Past Surgical History: Reviewed history from 10/29/2008 and no changes required. s/p cyst removal from the back  (2009)  Physical Exam  General:  alert and well-developed.    Lungs:  normal respiratory effort, no intercostal retractions, and no accessory muscle use.  few rhonchi with cough Heart:  normal rate, regular rhythm, no murmur, and no gallop.     Impression & Recommendations:  Problem # 1:  HYPOGONADISM (ICD-257.2) labs discussed, recheck yearly  Problem # 2:  AODM (ICD-250.00) labs, diet -exercise discussed low sugar symptoms discussed, see instructions   His updated medication list for this problem includes:    Baby Aspirin 81 Mg Chew (Aspirin) .Marland Kitchen... Take 1 tablet by mouth once a day    Actos 45 Mg Tabs (Pioglitazone hcl) .Marland Kitchen... 1 tablet by mouth once a day    Januvia 100 Mg Tabs (Sitagliptin phosphate) .Marland Kitchen... 1 by mouth qd    Cozaar 100 Mg Tabs (Losartan potassium) .Marland Kitchen... 1/2 tab qd  Orders: Venipuncture (95621) TLB-A1C / Hgb A1C (Glycohemoglobin) (83036-A1C) Specimen Handling (30865)  Labs Reviewed: Creat: 0.91 (04/01/2009)     Last Eye Exam: normal (06/22/2009) Reviewed HgBA1c results: 7.1 (04/01/2009)  6.6 (11/05/2008)  Problem # 3:  HYPERTENSION (ICD-401.9) good ambulatory BPs  His updated medication list for this problem includes:    Hydrochlorothiazide 25 Mg Tabs (Hydrochlorothiazide) .Marland Kitchen... Take 1 tab qd    Cozaar 100 Mg Tabs (Losartan potassium) .Marland Kitchen... 1/2 tab qd  Orders: TLB-BMP (Basic Metabolic Panel-BMET) (80048-METABOL) Specimen Handling (78469)  BP today: 124/86 Prior BP: 126/86 (03/24/2010)  Labs Reviewed: K+: 3.7 (04/01/2009) Creat: : 0.91 (04/01/2009)   Chol: 163 (03/24/2010)   HDL: 41.70 (03/24/2010)   LDL: 98 (03/24/2010)   TG: 119.0 (03/24/2010)  Complete Medication List: 1)  Hydrochlorothiazide 25 Mg Tabs (Hydrochlorothiazide) .... Take 1 tab qd 2)  Baby Aspirin 81 Mg Chew (Aspirin) .... Take 1 tablet by mouth once a day 3)  Actos 45 Mg Tabs (Pioglitazone hcl) .Marland Kitchen.. 1 tablet by mouth once a day 4)  Januvia 100 Mg Tabs (Sitagliptin phosphate) .Marland Kitchen.. 1 by mouth qd 5)  Vytorin 10-40 Mg Tabs  (Ezetimibe-simvastatin) .... Take one half tab daily 6)  Cozaar 100 Mg Tabs (Losartan potassium) .... 1/2 tab qd 7)  Onetouch Ultra Test Strp (Glucose blood) .... Use one strip twice daily. 8)  Ambien 10 Mg Tabs (Zolpidem tartrate) .Marland Kitchen.. 1 by mouth at bedtime as needed 9)  Pantoprazole Sodium 40 Mg Tbec (Pantoprazole sodium) .... Take 1 tab once daily  Patient Instructions: 1)  Please schedule a follow-up appointment in 4 months .  2)  if your sugar are frecuently < 90, let me know    Orders Added: 1)  Venipuncture [36415] 2)  TLB-A1C / Hgb A1C (Glycohemoglobin) [83036-A1C] 3)  TLB-BMP (Basic Metabolic Panel-BMET) [80048-METABOL] 4)  Specimen Handling [99000] 5)  Est. Patient Level III [16109]

## 2010-06-05 ENCOUNTER — Telehealth: Payer: Self-pay | Admitting: Internal Medicine

## 2010-06-05 MED ORDER — GLUCOSE BLOOD VI STRP: ORAL_STRIP | Status: AC

## 2010-06-05 NOTE — Telephone Encounter (Signed)
Patient wants new prescription for One Touch Ultra Test Strips called into [facility]

## 2010-06-15 LAB — GLUCOSE, CAPILLARY: Glucose-Capillary: 131 mg/dL — ABNORMAL HIGH (ref 70–99)

## 2010-07-24 ENCOUNTER — Other Ambulatory Visit: Payer: Self-pay | Admitting: Internal Medicine

## 2010-07-25 NOTE — Op Note (Signed)
NAME:  Anthony [name], Anthony [name]                   ACCOUNT NO.:  [id number]   MEDICAL RECORD NO.:  [id number]          PATIENT TYPE:  AMB   LOCATION:  DAY                          FACILITY:  [facility]   PHYSICIAN:  Wilmon Arms. Corliss Skains, M.D. DATE OF BIRTH:  08-20-1955   DATE OF PROCEDURE:  10/24/2007  DATE OF DISCHARGE:                               OPERATIVE REPORT   PREOPERATIVE DIAGNOSIS:  Lipoma (8 cm) mid-back.   POSTOPERATIVE DIAGNOSIS:  Lipoma (7 cm) mid-back.   PROCEDURE PERFORMED:  Excision back lipoma.   SURGEON:  Wilmon Arms. Tsuei, M.D.   ANESTHESIA:  General endotracheal.   INDICATIONS:  The patient is a 55 year old male who presents with a  slowly enlarging mass in the middle of his back.  This is causing some  discomfort.  He is unable lie flat comfortably on his back.  He presents  now for removal of this area.   DESCRIPTION OF PROCEDURE:  The patient brought to the operating room and  placed in supine position on the stretcher.  After an adequate level of  general anesthesia was obtained, the patient was flipped to a prone  position on the operating room table.  Appropriate padding was placed.  His back was prepped with Betadine and draped in sterile fashion.  Time-  out was taken to ensure the proper patient, proper procedure.  Area over  the lipoma was infiltrated with 10 mL of 0.25% Marcaine.  A transverse  incision was made over the mass.  Dissection was carried down through  the subcutaneous tissues to the surface of the lipoma.  We dissected  around the mass in all directions.  We then dissected the lipoma  directly off of the muscles.  The specimen was sent for pathologic  examination.  The wound was then thoroughly irrigated, inspected for  hemostasis.  We placed a 10-French flat Blake drain through a stab  incision into the cavity.  The wound was closed with staples.  Dry  dressing was applied.  The drain was placed to bulb suction.  The  patient was then extubated, brought to  recovery in stable condition.  All sponge, instrument and needle counts were correct.      Wilmon Arms. Tsuei, M.D.  Electronically Signed     MKT/MEDQ  D:  10/24/2007  T:  10/24/2007  Job:  [id number]

## 2010-07-28 NOTE — Cardiovascular Report (Signed)
NAME:  Chirino, Laydon                   ACCOUNT NO.:  [id number]   MEDICAL RECORD NO.:  [id number]          PATIENT TYPE:  OIB   LOCATION:  1961                         FACILITY:  [facility]   PHYSICIAN:  Bevelyn Buckles. Bensimhon, MDDATE OF BIRTH:  [birth year]   DATE OF PROCEDURE:  05/10/2006  DATE OF DISCHARGE:                            CARDIAC CATHETERIZATION   DATE OF PROCEDURE:  May 10, 2006.   PRIMARY CARE PHYSICIAN:  primary care physician is Dr. Willow Ora.   IDENTIFICATION:  Mr. Portell is a very pleasant 55 year old male with  multiple cardiac risk factors including hypertension, hyperlipidemia,  diabetes and a strong family history.  He has had a somewhat atypical  chest pain for several years.  He had a stress Myoview in March of 2006  that showed an EF of 64% .  Given his risk factors and continuing pain,  we decided to pursue cardiac catheterization.  This was performed the  outpatient catheterization lab.   PROCEDURES PERFORMED:  1. Selective coronary angiography.  2. Left heart catheterization.  3. Left ventriculogram.   DESCRIPTION OF PROCEDURE:  The risks and benefits of the catheterization  were explained.  Consent was signed and placed in the chart.  A 4-French  sheath was placed in the right femoral artery using a modified Seldinger  technique.  Standard catheters including JL-4, 3-DRC and angled pigtail  were used for the procedure.  All catheter exchanges were made over  wire.  No apparent complications.   FINDINGS:  Central aortic pressure were 105/62 with a mean of 81.  LV  pressure was 111/4 with an EDP of 13.  No aortic stenosis.   Left main was normal.   LAD was a large vessel coursing to the apex.  It gave off three small  diagonal's.  Initially the mid-to-distal LAD seemed somewhat small,  perhaps a component of spasm.  We gave some intracoronary nitroglycerin  and this improved somewhat.   Left circumflex made up primarily of a large OM-1.  It was ectatic  but  no significant coronary disease.   Right coronary artery is a very large ectatic vessel that gave off a  large PDA and two posterolateral's.  There is TIMI II flow throughout  the vessel, question due to the size and ectasia, versus the recent  administration of intracoronary nitroglycerin.   Left ventriculogram.  This was done the RAO position and showed an EF of  50%.  There appeared to be mild apical ballooning and hypokinesis.  No  significant mitral regurgitation.   ASSESSMENT:  1. No significant epicardial coronary artery disease.  2. Questionable mild vasospasm in the left anterior descending.  3. Low normal ejection fraction with mild apical ballooning, question      of Takotsubo's cardiomyopathy.   PLAN:  We will proceed with medical therapy.  Unfortunately his heart  rate is too low to add a beta blocker, so we will add Imdur 30 mg a day.  I will see him back in clinic with an echocardiogram.      Bevelyn Buckles. Bensimhon, MD  Electronically  Signed     DRB/MEDQ  D:  05/10/2006  T:  05/10/2006  Job:  [id number]

## 2010-07-28 NOTE — Assessment & Plan Note (Signed)
[facility]                            CARDIOLOGY OFFICE NOTE   NAME:LONGLeslee, Haueter                          MRN:          [id number]  DATE:04/30/2006                            DOB:          17-Apr-1955    REASON FOR CONSULTATION:  Chest pain.   HISTORY OF PRESENT ILLNESS:  Mr. Rack is a very pleasant 55 -year-old  male with multiple cardiac risk factors including diabetes,  hypertension, hyperlipidemia, obesity and a strong family history of  coronary artery disease. He has had chest pain for at least five years.  He had a stress test in [place] in 2002, which was negative. More  recently in March of 2006, he also had a stress test here, which showed  an EF of 64% with minimal soft-tissue attenuation and no convincing  ischemia or infarct. Since that time, he continues to have somewhat  atypical chest pain. He says it is mostly when he is using his upper  body. He feels numbness and an uncomfortable feeling going up his left  arm through his chest and sometimes down his right arm. This lasts a  little bit after he does work and then goes away. There is no associated  shortness of breath, nausea, vomiting or diaphoresis. He is able to walk  on the treadmill for 20 minutes at a time at 3.5 miles per hour without  any chest pain. He does get some shortness of breath with his more  vigorous activities. He denies any orthopnea or PND. He had one episode  of chest pain at night about 2 or 3 months ago. He said he had a sharp  pain in the middle of his chest that woke him from sleep. There was no  associated symptoms. He was not sure if it was reflux or other heart  pain. He does have occasional pain in his calf muscles, but this Is not  reproducible. He is also swelling at the end of the day.   REVIEW OF SYSTEMS:  As per HPI and problem list. Also notable for  constipation, heavy snoring and severe daytime fatigue. Also, has  gastroesophageal reflux  disease and allergies.   PAST MEDICAL HISTORY:  1. History of chest pain.      a.     Status post two previous nuclear stress tests in 2002 and       March of 2006, both of which have been negative. Normal ejection       fraction.  2. Obesity.  3. Hypertension.  4. Hyperlipidemia.  5. Diabetes.  6. Gastroesophageal reflux disease.   CURRENT MEDICATIONS:  1. Cozaar 50 mg a day.  2. Hydrochlorothiazide daily.  3. Actos 45 mg half tablet a day.  4. Vytorin 10/40 half tablet a day.  5. Tetrabenazine 250 mg a day.  6. Piroxicam 20 mg a day.  7. Aspirin 81 a day.  8. Ambien 10 mg a day.   ALLERGIES:  No known drug allergies.   SOCIAL HISTORY:  He is married with two kids. Lives in [place]. Works  as  a truck Hospital doctor. Denies tobacco or occasional alcoholic drinks.   FAMILY HISTORY:  Mother is alive at 67. Father died at age 30 due to a  staph infection related to a bypass surgery. Brother is 108 years old and  has a history of coronary disease and had bypass surgery at age 7.   PHYSICAL EXAMINATION:  He is in no acute distress. He ambulates around  the clinic without any respiratory difficulty. Blood pressure is 108/76.  His weight is 271. Heart rate is 70.  HEENT: Sclerae anicteric. EOMI. There is no xanthelasma. Mucus membranes  are moist. Oropharynx is clear.  NECK: is supple. No JVD. Carotids are 2+ bilaterally without bruits.  There is no lymphadenopathy, thyromegaly.  CARDIAC: Is a regular rate and a rhythm with an S4. No murmur.  LUNGS:  Are clear.  ABDOMEN: Obese, nontender, nondistended. No hepatosplenomegaly. No  bruits. No masses appreciated. Good bowel sounds.  EXTREMITIES: Are warm with no cyanosis, clubbing or edema. No rashes.  Good distal pulses. DP pulses are 2+ bilaterally.  NEURO: alert and oriented x3. Cranial nerves II-XII are intact. Moves  all 4 extremities without difficulty. Affect is pleasant.   EKG shows a sinus rhythm at a rate of 70. There is a  first-degree AV  block, 202 milliseconds, otherwise axis and intervals are normal.  No ST-  T-wave changes.   ASSESSMENT/PLAN:  1. Chest pain. This is fairly atypical. However, he does have multiple      cardiac risk factors including a very strong family history. We had      a [place] talk about possible workups including repeating his stress      test or proceeding to cardiac catheterization. Given his anxiety      over his family history, we have made the decision to pursue with      cardiac catheterization to clearly define his coronary anatomy. I      also discussed with him the possibility that this may be related to      cervical disc disease. He said he has had problems with his neck in      the past.  2. Snoring and daytime somnolence. He clearly has obstructive sleep      apnea by history. I have asked him to followup with the      pulmonologist for a possible sleep study.  3. Hypertension, is well-controlled.  4. Hyperlipidemia, is followed by Dr. Drue Novel.  5. Diabetes, this is also followed by Dr. Drue Novel.   DISPOSITION:  Pending the results of his cardiac catheterization.     Bevelyn Buckles. Bensimhon, MD  Electronically Signed    DRB/MedQ  DD: 04/30/2006  DT: 04/30/2006  Job #: [id number]   cc:   Willow Ora, MD

## 2010-07-28 NOTE — Assessment & Plan Note (Signed)
[facility]                            CARDIOLOGY OFFICE NOTE   NAME:LONGGarry, Nicolini                          MRN:          [id number]  DATE:05/22/2006                            DOB:          18-Jan-1956    REFERRING PHYSICIAN:  Willow Ora, MD   INTERVAL HISTORY:  Mr. Winsett is a very pleasant 55 year old male with a  history of diabetes, hypertension, hyperlipidemia, and chest pain.  He  recently underwent cardiac catheterization, which showed normal coronary  arteries, except for some, perhaps, mild LAD vasospasm.  Ventriculography showed an EF of 50% with some apical hypokineses.  He  underwent followup echocardiogram which confirmed an EF of 50%-55% with  mild hypokinesis of the periapical wall.  There was some mild-to-  moderate aortic root dilatation, about 4.5 mm.  After his  catheterization, we started him on some Imdur for a question of spasm as  well as some Protonix.  He was unable to tolerate the Imdur after a week  due to severe, constant headaches.  He says that the Protonix has  essentially relieved his chest pain.  He is otherwise doing well, no  palpitations, no heart failure symptoms.  He is pending his sleep study.   CURRENT MEDICATIONS:  1. Cozaar 50 mg a day.  2. Hydrochlorothiazide 25 a day.  3. Actos 45 mg half tablet a day.  4. Vytorin 10/40.  5. Terbinafine 250 mg a day.  6. Aspirin 81.  7. Ambien 10.  8. Protonix 40.   He has no known drug allergies.   PHYSICAL EXAMINATION:  He is well-appearing, no acute distress.  Ambulates around the clinic without any respiratory difficulty.  Blood pressure is 119/90, heart rate is 69, weight is 273.  HEENT:  Sclerae anicteric, EOMI, there are no xanthelasma.  Mucous  membranes are moist.  Oropharynx clear.  NECK:  Supple, no JVD.  Carotids are 2+ bilaterally without any bruits.  There is no lymphadenopathy or thyromegaly.  CARDIAC:  Regular rate and rhythm without any murmurs, rubs,  or gallops.  LUNGS:  Clear.  ABDOMEN:  Obese, nontender, nondistended.  No hepatosplenomegaly, no  bruits, no masses.  Good bowel sounds.  EXTREMITIES:  Warm with no cyanosis, clubbing, or edema.  Good distal  pulses.  NEURO:  He is alert and oriented x3.  Cranial nerves II-XII are intact.  Moves all 4 extremities without difficulty.  Affect is very pleasant.   ASSESSMENT AND PLAN:  1. A low-normal left ventricular function.  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  We discussed the      possibility of a cardiac MRI to further sort this out, but he is      not sure he wants to proceed with that.  We will just go ahead and      check an echocardiogram in 3 months to further evaluate.  We will      start Toprol XL  25 mg a day and continue him on his Cozaar.  2. Chest pain.  I suspect this was mostly related to gastroesophageal      reflux disease, and this is much improved on the Protonix.  3. Hypertension.  Blood pressure relatively well controlled.      Diastolic is a bit up today, he will follow with Dr. Drue Novel.  4. Diabetes and hyperlipidemia, as per Dr. Drue Novel.     Bevelyn Buckles. Bensimhon, MD  Electronically Signed    DRB/MedQ  DD: 05/22/2006  DT: 05/24/2006  Job #: [id number]   cc:   Willow Ora, MD

## 2010-09-29 ENCOUNTER — Ambulatory Visit: Payer: BC Managed Care – PPO | Admitting: Internal Medicine

## 2010-10-06 ENCOUNTER — Ambulatory Visit: Payer: BC Managed Care – PPO | Admitting: Internal Medicine

## 2010-10-25 ENCOUNTER — Other Ambulatory Visit: Payer: Self-pay | Admitting: Internal Medicine

## 2010-10-25 NOTE — Telephone Encounter (Signed)
Rx Done. 3-mth supply to [facility] w/notation: Need Office Visit

## 2010-11-23 ENCOUNTER — Other Ambulatory Visit: Payer: Self-pay | Admitting: Internal Medicine

## 2010-11-23 MED ORDER — PIOGLITAZONE HCL 45 MG PO TABS: 45.0000 mg | ORAL_TABLET | Freq: Every day | ORAL | Status: DC

## 2010-11-23 NOTE — Telephone Encounter (Signed)
Patient's wife called to check status of refill request from [facility] for Actos---we dont have any record of receiving a prescription---pt is out of meds--please call [facility] and also send a 90 day supply prescription to [facility]

## 2010-11-23 NOTE — Telephone Encounter (Signed)
[facility] but not [facility]. Pt was due for a 4 month follow up July. Advised to schedule appointment before further refills

## 2010-12-08 LAB — DIFFERENTIAL
Eosinophils Relative: 1
Lymphocytes Relative: 24
Lymphs Abs: 1.5
Monocytes Relative: 10

## 2010-12-08 LAB — BASIC METABOLIC PANEL
BUN: 17
GFR calc Af Amer: >60
GFR calc non Af Amer: >60
Potassium: 3.8
Sodium: 137

## 2010-12-08 LAB — GLUCOSE, CAPILLARY
Glucose-Capillary: 135 — ABNORMAL HIGH
Glucose-Capillary: 150 — ABNORMAL HIGH

## 2010-12-08 LAB — CBC
HCT: 40
Platelets: 265
RBC: 4.36
WBC: 6.1

## 2010-12-28 ENCOUNTER — Other Ambulatory Visit: Payer: Self-pay | Admitting: Internal Medicine

## 2010-12-28 MED ORDER — PIOGLITAZONE HCL 45 MG PO TABS: 45.0000 mg | ORAL_TABLET | Freq: Every day | ORAL | Status: DC

## 2010-12-28 NOTE — Telephone Encounter (Signed)
Done for 30-day. OV scheduled 01/24/11.

## 2010-12-28 NOTE — Telephone Encounter (Signed)
Pt requesting refill of pioglitazone 45mg  [facility]

## 2011-01-02 ENCOUNTER — Other Ambulatory Visit: Payer: Self-pay | Admitting: Internal Medicine

## 2011-01-03 ENCOUNTER — Other Ambulatory Visit: Payer: Self-pay

## 2011-01-03 NOTE — Telephone Encounter (Signed)
Okay to call #3o and one refill. Please  Schedule a office visit as well.

## 2011-01-22 ENCOUNTER — Encounter: Payer: Self-pay | Admitting: Internal Medicine

## 2011-01-23 ENCOUNTER — Encounter: Payer: Self-pay | Admitting: Internal Medicine

## 2011-01-23 ENCOUNTER — Other Ambulatory Visit: Payer: Self-pay | Admitting: Internal Medicine

## 2011-01-24 ENCOUNTER — Ambulatory Visit (INDEPENDENT_AMBULATORY_CARE_PROVIDER_SITE_OTHER): Payer: BC Managed Care – PPO | Admitting: Internal Medicine

## 2011-01-24 ENCOUNTER — Encounter: Payer: Self-pay | Admitting: Internal Medicine

## 2011-01-24 VITALS — BP 100/70 | HR 63 | Temp 97.9°F | Ht 75.0 in | Wt 281.0 lb

## 2011-01-24 DIAGNOSIS — E785 Hyperlipidemia, unspecified: Secondary | ICD-10-CM

## 2011-01-24 DIAGNOSIS — F32A Depression, unspecified: Secondary | ICD-10-CM | POA: Insufficient documentation

## 2011-01-24 DIAGNOSIS — E119 Type 2 diabetes mellitus without complications: Secondary | ICD-10-CM

## 2011-01-24 DIAGNOSIS — E291 Testicular hypofunction: Secondary | ICD-10-CM

## 2011-01-24 DIAGNOSIS — F329 Major depressive disorder, single episode, unspecified: Secondary | ICD-10-CM

## 2011-01-24 DIAGNOSIS — Z23 Encounter for immunization: Secondary | ICD-10-CM

## 2011-01-24 DIAGNOSIS — I10 Essential (primary) hypertension: Secondary | ICD-10-CM

## 2011-01-24 LAB — BASIC METABOLIC PANEL
BUN: 15 mg/dL (ref 6–23)
CO2: 30 mEq/L (ref 19–32)
Calcium: 8.9 mg/dL (ref 8.4–10.5)
Chloride: 104 mEq/L (ref 96–112)
Creatinine, Ser: 0.9 mg/dL (ref 0.4–1.5)

## 2011-01-24 NOTE — Assessment & Plan Note (Signed)
Due for labs, diet-exercise encouraged

## 2011-01-24 NOTE — Assessment & Plan Note (Signed)
No change, labs on RTC

## 2011-01-24 NOTE — Assessment & Plan Note (Signed)
New problem, related to job, marriage (trust issues) Counseled today rec to seek a counselor Meds? States is not ready

## 2011-01-24 NOTE — Assessment & Plan Note (Signed)
Over controlled? See instructions  Labs

## 2011-01-24 NOTE — Patient Instructions (Addendum)
Came back in 3 months for a physical exam, fasting  Get your eyes checked yearly  Check the  blood pressure 2 or 3 times a week, be sure it is between 110/60 and 140/85. If it is consistently different , let me know

## 2011-01-24 NOTE — Assessment & Plan Note (Addendum)
Last free testost normal, recheck on RTC Today he reports no  difficulties w/ ED

## 2011-01-24 NOTE — Progress Notes (Signed)
  Subjective:    Patient ID: Anthony [name], male    DOB: 1956/01/10, 55 y.o.   MRN: [id number]  HPI Routine office visit Hypertension--good medication compliance, ambulatory blood pressures usually within normal, BP today slightly low, he admits to feeling tired from time to time and also dizzy when he stands up but that's not a common occurrence. Diabetes--good medication compliance, blood sugars in the morning 135, postprandial in the 180s. Cholesterol-- good medication compliance.   Past Medical History  Diagnosis Date  . Diabetes mellitus   . Hypertension   . Hyperlipidemia   . GERD (gastroesophageal reflux disease)   . DJD (degenerative joint disease)     per MRI of LS spine 2005  . Chronic back pain   . Hypogonadism male     dx w/ hypogonadism elsewhere ~ 2011  . Normal cardiac stress test     (-) stress test 2003, 2006  . S/P cardiac cath      04-2006 neg per pt--CP improved w/ PPIs    Review of Systems No chest pain or shortness or breath No nausea, vomiting, diarrhea Has not gotten  his flu shot yet. As far as diet and exercise, he does "okay" on and off. On further questioning, he has been feeling mildly depressed, no suicidal, mostly related to to his relationship with his wife and job. He has 2 sons and 4 granddaughters, they live in [place], he visits them once a month. States is  not ready for medicines      Objective:   Physical Exam  Constitutional: He is oriented to person, place, and time. He appears well-developed and well-nourished.  HENT:  Head: Normocephalic and atraumatic.  Cardiovascular: Normal rate, regular rhythm and normal heart sounds.   No murmur heard. Pulmonary/Chest: Breath sounds normal. No respiratory distress. He has no wheezes. He has no rales.  Musculoskeletal: He exhibits no edema.  Neurological: He is alert and oriented to person, place, and time.  Skin: Skin is warm and dry.  Psychiatric:       In no distress, cooperative,  coherent ; no anxiety, slightly sad      Assessment & Plan:

## 2011-01-26 ENCOUNTER — Telehealth: Payer: Self-pay

## 2011-01-26 MED ORDER — METFORMIN HCL 850 MG PO TABS: ORAL_TABLET | ORAL | Status: DC

## 2011-01-26 NOTE — Telephone Encounter (Signed)
Pt aware of results and verbalized understanding. Rx sent to pharmacy and labs mailed to pt

## 2011-01-26 NOTE — Telephone Encounter (Signed)
Message copied by Beverely Low on Fri Jan 26, 2011 10:35 AM ------      Message from: Anthony [name]      Created: Thu Jan 25, 2011  5:47 PM       Advise patient:      Needs better diabetes control, add Glucophage 850 one tablet daily for one week, then one tablet twice a day. #60, 6 RF      Call if side effects, come back in 3 months as planned. Needs to work on diet and exercise

## 2011-02-07 ENCOUNTER — Telehealth: Payer: Self-pay | Admitting: Internal Medicine

## 2011-02-07 NOTE — Telephone Encounter (Signed)
Patient has been monitoring bp with digital bp cuff - he had a reading today 105/69 pulse 97 - wanted to if bp too low

## 2011-02-07 NOTE — Telephone Encounter (Signed)
On last OV 01-24-11  BP 100/70 pulse 63. Pt advise BP is a low normal but will refer to Dr Drue Novel for further evaluation. Pt denies any dizziness, headache, blurred vision, chest pain, numbness or tingling. Pt advise if any of these symptoms occur Pt will need to be evaluated in ED, Pt ok and verbalized understanding.

## 2011-02-08 NOTE — Telephone Encounter (Signed)
Advise patient: decrease HCTZ to 1/2 tab a day, continue monitoring BP, needs to be  between 110/60 and 140/85. If it is consistently different , let me know

## 2011-02-08 NOTE — Telephone Encounter (Signed)
Left message to call office

## 2011-02-12 NOTE — Telephone Encounter (Signed)
Left message to call office

## 2011-02-14 NOTE — Telephone Encounter (Signed)
Discuss with patient  

## 2011-02-14 NOTE — Telephone Encounter (Signed)
Left message to call office

## 2011-02-23 ENCOUNTER — Other Ambulatory Visit: Payer: Self-pay | Admitting: Internal Medicine

## 2011-04-23 ENCOUNTER — Telehealth: Payer: Self-pay | Admitting: Internal Medicine

## 2011-04-23 ENCOUNTER — Other Ambulatory Visit: Payer: Self-pay | Admitting: Internal Medicine

## 2011-04-23 MED ORDER — PIOGLITAZONE HCL 45 MG PO TABS: 45.0000 mg | ORAL_TABLET | Freq: Every day | ORAL | Status: DC

## 2011-04-23 MED ORDER — METFORMIN HCL 850 MG PO TABS: ORAL_TABLET | ORAL | Status: DC

## 2011-04-23 NOTE — Telephone Encounter (Signed)
Please send refill of metformin and pioglitazone to [facility].

## 2011-04-23 NOTE — Telephone Encounter (Signed)
Refill done.  

## 2011-04-23 NOTE — Telephone Encounter (Signed)
Rx sent 

## 2011-04-26 ENCOUNTER — Ambulatory Visit (INDEPENDENT_AMBULATORY_CARE_PROVIDER_SITE_OTHER): Payer: BC Managed Care – PPO | Admitting: Internal Medicine

## 2011-04-26 ENCOUNTER — Encounter: Payer: Self-pay | Admitting: Internal Medicine

## 2011-04-26 DIAGNOSIS — E291 Testicular hypofunction: Secondary | ICD-10-CM

## 2011-04-26 DIAGNOSIS — F329 Major depressive disorder, single episode, unspecified: Secondary | ICD-10-CM

## 2011-04-26 DIAGNOSIS — Z0000 Encounter for general adult medical examination without abnormal findings: Secondary | ICD-10-CM

## 2011-04-26 DIAGNOSIS — F32A Depression, unspecified: Secondary | ICD-10-CM

## 2011-04-26 DIAGNOSIS — I10 Essential (primary) hypertension: Secondary | ICD-10-CM

## 2011-04-26 DIAGNOSIS — E119 Type 2 diabetes mellitus without complications: Secondary | ICD-10-CM

## 2011-04-26 DIAGNOSIS — E785 Hyperlipidemia, unspecified: Secondary | ICD-10-CM

## 2011-04-26 LAB — PSA: PSA: 0.78 ng/mL (ref 0.10–4.00)

## 2011-04-26 LAB — COMPREHENSIVE METABOLIC PANEL
Alkaline Phosphatase: 65 U/L (ref 39–117)
BUN: 19 mg/dL (ref 6–23)
CO2: 28 mEq/L (ref 19–32)
Creatinine, Ser: 0.9 mg/dL (ref 0.4–1.5)
GFR: 99.98 mL/min (ref >60.00)
Glucose, Bld: 115 mg/dL — ABNORMAL HIGH (ref 70–99)
Total Bilirubin: 1 mg/dL (ref 0.3–1.2)
Total Protein: 6.8 g/dL (ref 6.0–8.3)

## 2011-04-26 LAB — LIPID PANEL
Cholesterol: 132 mg/dL (ref 0–200)
HDL: 39 mg/dL — ABNORMAL LOW (ref >39.00)
Triglycerides: 97 mg/dL (ref 0.0–149.0)
VLDL: 19.4 mg/dL (ref 0.0–40.0)

## 2011-04-26 LAB — CBC WITH DIFFERENTIAL/PLATELET
Basophils Relative: 0.5 % (ref 0.0–3.0)
Eosinophils Relative: 1.3 % (ref 0.0–5.0)
HCT: 41.2 % (ref 39.0–52.0)
Lymphs Abs: 1.4 10*3/uL (ref 0.7–4.0)
Monocytes Relative: 10.2 % (ref 3.0–12.0)
Platelets: 249 10*3/uL (ref 150.0–400.0)
RBC: 4.36 Mil/uL (ref 4.22–5.81)
WBC: 5.4 10*3/uL (ref 4.5–10.5)

## 2011-04-26 LAB — HEMOGLOBIN A1C: Hgb A1c MFr Bld: 7.2 % — ABNORMAL HIGH (ref 4.6–6.5)

## 2011-04-26 NOTE — Assessment & Plan Note (Signed)
On no HRT x a while, labs

## 2011-04-26 NOTE — Progress Notes (Signed)
  Subjective:    Patient ID: Anthony [name], male    DOB: 05-22-1955, 56 y.o.   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 Tolerating Glucophage without problems Hypertension, diuretics were reduced to half, ambulatory blood pressure 120/78 Depression, see previous visit note, all issues resolved, doing very well. No chest pain, shortness or breath No nausea, vomiting, diarrhea No difficulty urinating.    Objective:   Physical Exam  Constitutional: He is oriented to person, place, and time. He appears well-developed. No distress.  HENT:  Head: Normocephalic and atraumatic.  Cardiovascular: Normal rate, regular rhythm and normal heart sounds.   No murmur heard. Pulmonary/Chest: Effort normal and breath sounds normal. No respiratory distress. He has no wheezes. He has no rales.  Abdominal: Soft. Bowel sounds are normal. He exhibits no distension. There is no tenderness. There is no rebound and no guarding.  Genitourinary: Rectum normal and prostate normal.  Musculoskeletal:       DIABETIC FEET EXAM: No lower extremity edema Normal pedal pulses bilaterally Skin and nails are normal without calluses Pinprick examination of the feet normal.   Neurological: He is alert and oriented to  person, place, and time.  Skin: He is not diaphoretic.  Psychiatric: He has a normal mood and affect. His behavior is normal. Judgment and thought content normal.      Assessment & Plan:

## 2011-04-26 NOTE — Assessment & Plan Note (Signed)
Started metformin, tolerates well, labs

## 2011-04-26 NOTE — Assessment & Plan Note (Signed)
Due for labs

## 2011-04-26 NOTE — Assessment & Plan Note (Addendum)
Td  10-2008  Pt reports 2 Cscope , last  11-11, next 01-2013 diet  and exercise discussed Labs

## 2011-04-26 NOTE — Assessment & Plan Note (Signed)
Well controlled, labs  

## 2011-04-26 NOTE — Assessment & Plan Note (Signed)
Resolved, see HPI. 

## 2011-04-27 LAB — TESTOSTERONE, FREE, TOTAL, SHBG
Testosterone-% Free: 2.3 % (ref 1.6–2.9)
Testosterone: 203.04 ng/dL — ABNORMAL LOW (ref 250–890)

## 2011-05-02 ENCOUNTER — Encounter: Payer: Self-pay | Admitting: *Deleted

## 2011-05-18 ENCOUNTER — Other Ambulatory Visit: Payer: Self-pay | Admitting: Internal Medicine

## 2011-05-18 NOTE — Telephone Encounter (Signed)
Prescriptions sent to pharmacy

## 2011-05-30 ENCOUNTER — Ambulatory Visit (INDEPENDENT_AMBULATORY_CARE_PROVIDER_SITE_OTHER): Payer: BC Managed Care – PPO | Admitting: Internal Medicine

## 2011-05-30 VITALS — BP 134/80 | HR 71 | Temp 98.1°F | Wt 271.0 lb

## 2011-05-30 DIAGNOSIS — J069 Acute upper respiratory infection, unspecified: Secondary | ICD-10-CM

## 2011-05-30 MED ORDER — AMOXICILLIN 500 MG PO CAPS: 1000.0000 mg | ORAL_CAPSULE | Freq: Two times a day (BID) | ORAL | Status: AC

## 2011-05-30 MED ORDER — AZELASTINE HCL 0.1 % NA SOLN: 2.0000 | Freq: Two times a day (BID) | NASAL | Status: DC

## 2011-05-30 NOTE — Progress Notes (Signed)
  Subjective:    Patient ID: Anthony [name], male    DOB: 1955-08-23, 56 y.o.   MRN: [id number]  HPI Acute visit Symptoms started 2 days ago with ear ache bilaterally, sore throat, frontal headaches. He is also having cough productive of clear to green sputum.   Past Medical History:  AODM  HYPERLIPIDEMIA  HYPERTENSION  GERD  DJD, per MRI of LS spine 2005  (-) stress test 2003, 2006  Cardiac cath 04-2006 neg per pt--CP improved w/ PPIs  dx w/ hypogonadism elsewhere ~ 2011  Past Surgical History:  s/p cyst removal from the back (2009)   Social History:  Married, two children  truck Hospital doctor, delivers fuel  Never Smoked  Alcohol use-yes (3/wk)  Drug use-no    Review of Systems No fevers, chills yesterday. No nausea, vomiting, diarrhea. Mild nasal discharge, clear to green. No shortness of breath or wheezing    Objective:   Physical Exam  General -- alert, well-developed, and well-nourished. NAD  Neck --no LADs HEENT -- TMs slt bulge, no red , throat w/o redness, face symmetric and not tender to palpation, nose slt congested  Lungs -- normal respiratory effort, no intercostal retractions, no accessory muscle use, and normal breath sounds.   Heart-- normal rate, regular rhythm, no murmur, and no gallop.        Assessment & Plan:  URI See instructions

## 2011-05-30 NOTE — Patient Instructions (Addendum)
Rest, fluids , tylenol For cough, take Mucinex DM twice a day as needed  For congestion use astelin nasal spray until better  Take the antibiotic as prescribed only if no better in 2-3 days  (Amoxicillin) Call if no better in few days Call anytime if the symptoms are severe, you have high fever

## 2011-05-31 ENCOUNTER — Encounter: Payer: Self-pay | Admitting: Internal Medicine

## 2011-06-07 ENCOUNTER — Ambulatory Visit: Payer: BC Managed Care – PPO | Admitting: Internal Medicine

## 2011-07-20 LAB — HM DIABETES EYE EXAM

## 2011-07-23 ENCOUNTER — Other Ambulatory Visit: Payer: Self-pay | Admitting: Internal Medicine

## 2011-07-23 NOTE — Telephone Encounter (Signed)
Refill done.  

## 2011-07-26 ENCOUNTER — Encounter: Payer: Self-pay | Admitting: *Deleted

## 2011-07-31 ENCOUNTER — Encounter: Payer: Self-pay | Admitting: Internal Medicine

## 2011-07-31 ENCOUNTER — Ambulatory Visit (INDEPENDENT_AMBULATORY_CARE_PROVIDER_SITE_OTHER): Payer: BC Managed Care – PPO | Admitting: Internal Medicine

## 2011-07-31 VITALS — BP 128/82 | HR 72 | Temp 97.9°F | Wt 267.0 lb

## 2011-07-31 DIAGNOSIS — R5381 Other malaise: Secondary | ICD-10-CM

## 2011-07-31 DIAGNOSIS — I10 Essential (primary) hypertension: Secondary | ICD-10-CM

## 2011-07-31 DIAGNOSIS — R7989 Other specified abnormal findings of blood chemistry: Secondary | ICD-10-CM

## 2011-07-31 DIAGNOSIS — R5383 Other fatigue: Secondary | ICD-10-CM | POA: Insufficient documentation

## 2011-07-31 DIAGNOSIS — E119 Type 2 diabetes mellitus without complications: Secondary | ICD-10-CM

## 2011-07-31 DIAGNOSIS — E291 Testicular hypofunction: Secondary | ICD-10-CM

## 2011-07-31 LAB — HEMOGLOBIN A1C: Hgb A1c MFr Bld: 6.9 % — ABNORMAL HIGH (ref 4.6–6.5)

## 2011-07-31 LAB — AST: AST: 18 U/L (ref 0–37)

## 2011-07-31 LAB — FOLLICLE STIMULATING HORMONE: FSH: 5.5 m[IU]/mL (ref 1.4–18.1)

## 2011-07-31 LAB — LUTEINIZING HORMONE: LH: 2.61 m[IU]/mL (ref 1.50–9.30)

## 2011-07-31 NOTE — Assessment & Plan Note (Signed)
Mild decrease on testosterone, will recheck a testosterone along with the FSH, LH and prolactin level. This problem may explain fatigue. See below

## 2011-07-31 NOTE — Assessment & Plan Note (Signed)
Well controlled 

## 2011-07-31 NOTE — Assessment & Plan Note (Addendum)
Metformin dose was increased based on the last A1c, tolerates well. Will check a A1c, AST and ALT Feet exam normal today

## 2011-07-31 NOTE — Progress Notes (Signed)
  Subjective:    Patient ID: Anthony [name], male    DOB: [birth year], 56 y.o.   MRN: [id number]  HPI Routine office visit Diabetes, metformin dose was increased, good compliance, blood sugar on average 130. Hypertension, good medication compliance, amb  BPs are ~ 120/70. Sleep apnea? Patient wonders about his diagnosis, he does snore, half of the days he feels unrested in the morning, occasionally feels fatigued, frequently feels sleepy.  Past Medical History:   AODM   HYPERLIPIDEMIA   HYPERTENSION   GERD   DJD, per MRI of LS spine 2005   (-) stress test 2003, 2006   Cardiac cath 04-2006 neg per pt--CP improved w/ PPIs   dx w/ hypogonadism elsewhere ~ 2011    Past Surgical History:   s/p cyst removal from the back (2009)   Social History:   Married, two children   truck Hospital doctor, delivers fuel   Never Smoked   Alcohol use-yes (3/wk)   Drug use-no    Review of Systems With increase on metformin, he has not experienced nausea, vomiting, diarrhea. Does not need Ambien anymore. Testosterone was slightly low, very seldom he has decreased libido or problems with ED. Request vitamin B12 & D check.     Objective:   Physical Exam General -- alert, well-developed, and overweight appearing. No apparent distress.  Lungs -- normal respiratory effort, no intercostal retractions, no accessory muscle use, and normal breath sounds.   Heart-- normal rate, regular rhythm, no murmur, and no gallop.   DIABETIC FEET EXAM: No lower extremity edema Normal pedal pulses bilaterally Skin and nails are normal without calluses Pinprick examination of the feet normal. Psych-- Cognition and judgment appear intact. Alert and cooperative with normal attention span and concentration.  not anxious appearing and not depressed appearing.      Assessment & Plan:

## 2011-07-31 NOTE — Assessment & Plan Note (Signed)
Some malaise and fatigue for a while. TSH has been consistently normal in the last few years DDX includes hypogonadism, sleep apnea. Plan: We'll check a testosterone level, see above Sleep study Per patient request will get a B12 vitamin D levels

## 2011-08-01 LAB — TESTOSTERONE, FREE, TOTAL, SHBG: Testosterone: 195.86 ng/dL — ABNORMAL LOW (ref 300–890)

## 2011-08-01 LAB — VITAMIN D 25 HYDROXY (VIT D DEFICIENCY, FRACTURES): Vit D, 25-Hydroxy: 44 ng/mL (ref 30–89)

## 2011-08-07 ENCOUNTER — Encounter: Payer: Self-pay | Admitting: Internal Medicine

## 2011-08-08 NOTE — Progress Notes (Signed)
Addended by: Edwena Felty T on: 08/08/2011 09:52 AM   Modules accepted: Orders

## 2011-08-16 ENCOUNTER — Ambulatory Visit (INDEPENDENT_AMBULATORY_CARE_PROVIDER_SITE_OTHER): Payer: BC Managed Care – PPO | Admitting: Pulmonary Disease

## 2011-08-16 DIAGNOSIS — R5383 Other fatigue: Secondary | ICD-10-CM

## 2011-08-16 DIAGNOSIS — G4733 Obstructive sleep apnea (adult) (pediatric): Secondary | ICD-10-CM

## 2011-08-21 ENCOUNTER — Other Ambulatory Visit: Payer: Self-pay | Admitting: Internal Medicine

## 2011-08-21 ENCOUNTER — Telehealth: Payer: Self-pay

## 2011-08-21 DIAGNOSIS — G4733 Obstructive sleep apnea (adult) (pediatric): Secondary | ICD-10-CM

## 2011-08-21 NOTE — Telephone Encounter (Signed)
Issue discussed with the patient, referred to pulmonary

## 2011-08-21 NOTE — Telephone Encounter (Signed)
Call from [facility] and he stated he ws returning a call to Dr.Paz. He said the message was about his sleep study and getting treatment for sleep apnea. Please gve the patient a call back.      KP

## 2011-08-21 NOTE — Telephone Encounter (Signed)
Please advise 

## 2011-08-24 ENCOUNTER — Other Ambulatory Visit: Payer: Self-pay | Admitting: Internal Medicine

## 2011-08-24 NOTE — Telephone Encounter (Signed)
Refill done.  

## 2011-08-27 ENCOUNTER — Telehealth: Payer: Self-pay | Admitting: Internal Medicine

## 2011-08-27 NOTE — Telephone Encounter (Signed)
In reference to Endocrinology referral from 07/2011, patient was contacted by [facility] in [facility] to schedule his appointment, and patient would not schedule with them.  Patient informed them he wants the results of sleep study before deciding to schedule.  Are sleep and endo even related?  Shouldn't he proceed to schedule as you advised?

## 2011-08-27 NOTE — Telephone Encounter (Signed)
1. He has low testosterone, that is the reason why he needs to see endocrinology 2. He has another separate issue which is a sleep apnea and needs to see pulmonary These are 2 separate referrals, if he prefers to postpone the endocrinology referral, will arrange when he come back for his regular checkup

## 2011-09-05 ENCOUNTER — Encounter: Payer: Self-pay | Admitting: Pulmonary Disease

## 2011-09-05 ENCOUNTER — Ambulatory Visit (INDEPENDENT_AMBULATORY_CARE_PROVIDER_SITE_OTHER): Payer: BC Managed Care – PPO | Admitting: Pulmonary Disease

## 2011-09-05 VITALS — BP 140/78 | HR 97 | Temp 98.2°F | Ht 75.0 in | Wt 267.0 lb

## 2011-09-05 DIAGNOSIS — G4733 Obstructive sleep apnea (adult) (pediatric): Secondary | ICD-10-CM | POA: Insufficient documentation

## 2011-09-05 NOTE — Assessment & Plan Note (Signed)
The patient has at least moderate obstructive sleep apnea by his recent sleep study, and is definitely symptomatic on some days more than others.  Because of his high risk occupation, and its impact to his quality of life, I have recommended that he treat this aggressively.  The patient is agreeable to trying CPAP while he works aggressively on weight loss. I will set the patient up on cpap at a moderate pressure level to allow for desensitization, and will troubleshoot the device over the next 4-6weeks if needed.  The pt is to call me if having issues with tolerance.  Will then optimize the pressure once patient is able to wear cpap on a consistent basis.

## 2011-09-05 NOTE — Progress Notes (Signed)
  Subjective:    Patient ID: Anthony [name], male    DOB: May 26, 1955, 56 y.o.   MRN: [id number]  HPI The patient is a 56 year old male who I've been asked to see for management of a structured sleep apnea.  The patient recently underwent sleep testing, where he was found to have an AHI of 30 events per hour.  However, his sleep apnea is probably more severe than this, since he admits he did not sleep the full 7 hours.  The patient has been told that he has loud snoring by his wife, but she has never mentioned an abnormal breathing pattern during sleep.  The patient states that he has frequent awakenings at night, and his only rest at about 60% of the mornings.  He has definite daytime sleepiness with periods of inactivity, but does not feel that it is a major issue at this time.  He has sleepiness with driving on rare occasions, and will typically pull over and take a nap.  He also complains of daytime fatigue.  The patient states that his weight is decreased over the last 2 years, and his Epworth score today is 8.  Sleep Questionnaire: What time do you typically go to bed?( Between what hours) 9pm - 9:30 pm How Brockman does it take you to fall asleep? 10-15 minutes How many times during the night do you wake up? 3 What time do you get out of bed to start your day? 0400 Do you drive or operate heavy machinery in your occupation? Yes How much has your weight changed (up or down) over the past two years? (In pounds) 30 lb (13.608 kg) Have you ever had a sleep study before? No If yes, location of study? If yes, date of study? Do you currently use CPAP? No Do you wear oxygen at any time? No     Review of Systems  Constitutional: Negative for fever and unexpected weight change.  HENT: Negative for ear pain, nosebleeds, congestion, sore throat, rhinorrhea, sneezing, trouble swallowing, dental problem, postnasal drip and sinus pressure.   Eyes: Negative for redness and itching.  Respiratory: Positive for shortness of  breath. Negative for cough, chest tightness and wheezing.   Cardiovascular: Positive for leg swelling. Negative for palpitations.  Gastrointestinal: Negative for nausea and vomiting.  Genitourinary: Negative for dysuria.  Musculoskeletal: Negative for joint swelling.  Skin: Negative for rash.  Neurological: Negative for headaches.  Hematological: Does not bruise/bleed easily.  Psychiatric/Behavioral: Negative for dysphoric mood. The patient is not nervous/anxious.        Objective:   Physical Exam Constitutional:  Overweight male, no acute distress  HENT:  Nares patent without discharge, large turbs noted.   Oropharynx without exudate, palate and uvula are thick and elongated.   Eyes:  Perrla, eomi, no scleral icterus  Neck:  No JVD, no TMG  Cardiovascular:  Normal rate, regular rhythm, no rubs or gallops.  No murmurs        Intact distal pulses  Pulmonary :  Normal breath sounds, no stridor or respiratory distress   No rales, rhonchi, or wheezing  Abdominal:  Soft, nondistended, bowel sounds present.  No tenderness noted.   Musculoskeletal:  No lower extremity edema noted.  Lymph Nodes:  No cervical lymphadenopathy noted  Skin:  No cyanosis noted  Neurologic:  Alert, appropriate, moves all 4 extremities without obvious deficit.         Assessment & Plan:

## 2011-09-05 NOTE — Patient Instructions (Addendum)
Will start on cpap at moderate pressure level.  Please call if having tolerance issues. Work on weight loss followup with me in 5 weeks.  

## 2011-09-07 NOTE — Telephone Encounter (Signed)
I spoke with patient, he has completed his sleep study, followed up with Dr. Shelle Iron, but still wishes to decline the Endocrinology referral at this time.  I informed patient of Dr. Drue Novel comments, however, patient states he has placed himself on a strict diet, and will be starting a cpap machine soon, and just wants to hold off at this time.

## 2011-09-14 ENCOUNTER — Institutional Professional Consult (permissible substitution): Payer: BC Managed Care – PPO | Admitting: Pulmonary Disease

## 2011-09-28 ENCOUNTER — Encounter: Payer: BC Managed Care – PPO | Admitting: Internal Medicine

## 2011-10-16 ENCOUNTER — Encounter: Payer: Self-pay | Admitting: Pulmonary Disease

## 2011-10-16 ENCOUNTER — Ambulatory Visit (INDEPENDENT_AMBULATORY_CARE_PROVIDER_SITE_OTHER): Payer: BC Managed Care – PPO | Admitting: Pulmonary Disease

## 2011-10-16 VITALS — BP 110/76 | HR 69 | Temp 97.8°F | Ht 75.0 in | Wt 264.0 lb

## 2011-10-16 DIAGNOSIS — G4733 Obstructive sleep apnea (adult) (pediatric): Secondary | ICD-10-CM

## 2011-10-16 NOTE — Assessment & Plan Note (Signed)
The patient has had a positive response to CPAP, but we need to work on getting a better mask fit for him.  We also need to optimize his pressure on the automatic setting.  I've encouraged the patient to work aggressively on weight loss, and he will see me back in 6 months if doing well. Care Plan:  At this point, will arrange for the patient's machine to be changed over to auto mode for 2 weeks to optimize their pressure.  I will review the downloaded data once sent by dme, and also evaluate for compliance, leaks, and residual osa.  I will call the patient and dme to discuss the results, and have the patient's machine set appropriately.  This will serve as the pt's cpap pressure titration.

## 2011-10-16 NOTE — Progress Notes (Signed)
  Subjective:    Patient ID: Anthony [name], male    DOB: 02/21/1956, 56 y.o.   MRN: [id number]  HPI The patient comes in today for followup of his known obstructive sleep apnea.  He was started on CPAP at the last visit had a moderate pressure, and feels that he has done fairly well with the device overall.  He is having some mask leak issues, but no problems with the pressure.  He feels that he is sleeping better, and his daytime alertness has improved.   Review of Systems  Constitutional: Negative for fever and unexpected weight change.  HENT: Positive for congestion. Negative for ear pain, nosebleeds, sore throat, rhinorrhea, sneezing, trouble swallowing, dental problem, postnasal drip and sinus pressure.   Eyes: Negative for redness and itching.  Respiratory: Positive for cough. Negative for chest tightness, shortness of breath and wheezing.   Cardiovascular: Negative for palpitations and leg swelling.  Gastrointestinal: Negative for nausea and vomiting.  Genitourinary: Negative for dysuria.  Musculoskeletal: Negative for joint swelling.  Skin: Negative for rash.  Neurological: Negative for headaches.  Hematological: Does not bruise/bleed easily.  Psychiatric/Behavioral: Negative for dysphoric mood. The patient is not nervous/anxious.   All other systems reviewed and are negative.       Objective:   Physical Exam Overweight male in no acute distress Nose without purulence or discharge noted No skin breakdown or pressure necrosis from the CPAP mask Lower extremities without edema, no cyanosis Alert and oriented, moves all 4 extremities.       Assessment & Plan:

## 2011-10-16 NOTE — Patient Instructions (Addendum)
Will have your dme work with you on mask fit. Will set your machine on auto for the next 2 weeks, and will call you with your optimal pressure once the results are available.  Work on weight loss followup with me in 6mos, but call if having issues.

## 2011-11-04 ENCOUNTER — Other Ambulatory Visit: Payer: Self-pay | Admitting: Internal Medicine

## 2011-11-05 NOTE — Telephone Encounter (Signed)
Refill done.  

## 2011-11-10 ENCOUNTER — Other Ambulatory Visit: Payer: Self-pay | Admitting: Pulmonary Disease

## 2011-11-10 DIAGNOSIS — G4733 Obstructive sleep apnea (adult) (pediatric): Secondary | ICD-10-CM

## 2011-11-29 ENCOUNTER — Other Ambulatory Visit: Payer: Self-pay | Admitting: Internal Medicine

## 2011-11-30 ENCOUNTER — Ambulatory Visit: Payer: BC Managed Care – PPO | Admitting: Internal Medicine

## 2011-11-30 ENCOUNTER — Ambulatory Visit (INDEPENDENT_AMBULATORY_CARE_PROVIDER_SITE_OTHER): Payer: BC Managed Care – PPO | Admitting: Internal Medicine

## 2011-11-30 ENCOUNTER — Encounter: Payer: Self-pay | Admitting: Internal Medicine

## 2011-11-30 VITALS — BP 114/72 | HR 83 | Temp 98.7°F | Ht 73.5 in | Wt 266.0 lb

## 2011-11-30 DIAGNOSIS — E291 Testicular hypofunction: Secondary | ICD-10-CM

## 2011-11-30 DIAGNOSIS — E119 Type 2 diabetes mellitus without complications: Secondary | ICD-10-CM

## 2011-11-30 DIAGNOSIS — Z0000 Encounter for general adult medical examination without abnormal findings: Secondary | ICD-10-CM

## 2011-11-30 DIAGNOSIS — Z23 Encounter for immunization: Secondary | ICD-10-CM

## 2011-11-30 DIAGNOSIS — R5383 Other fatigue: Secondary | ICD-10-CM

## 2011-11-30 LAB — BASIC METABOLIC PANEL
Calcium: 9.3 mg/dL (ref 8.4–10.5)
Creatinine, Ser: 0.9 mg/dL (ref 0.4–1.5)

## 2011-11-30 LAB — AST: AST: 18 U/L (ref 0–37)

## 2011-11-30 LAB — LIPID PANEL
Cholesterol: 150 mg/dL (ref 0–200)
Triglycerides: 90 mg/dL (ref 0.0–149.0)

## 2011-11-30 LAB — ALT: ALT: 18 U/L (ref 0–53)

## 2011-11-30 NOTE — Progress Notes (Signed)
  Subjective:    Patient ID: Anthony [name], male    DOB: 1955-04-16, 56 y.o.   MRN: [id number]  HPI CPX  Past Medical History:   OSA dx 2013, on CPAP AODM   HYPERLIPIDEMIA   HYPERTENSION   GERD   DJD, per MRI of LS spine 2005   (-) stress test 2003, 2006   Cardiac cath 04-2006 neg per pt--CP improved w/ PPIs   dx w/ hypogonadism elsewhere ~ 2011    Past Surgical History:   s/p cyst removal from the back (2009)   Family History: prostate ca--no colon cancer--no colon polyps-- brother CABG-- father onset 57 and brother  DM--brother M - died 2011   F - deceased (staph infection)  Social History:   Married, two children   truck Hospital doctor, delivers fuel   Never Smoked   Alcohol use-yes (3/wk)   Drug use-no    Review of Systems Using a CPAP, much less fatigue than before. Diet and exercise has definitely improved, CBGs in the 120s fasting; 107, 120 during the daytime. No chest pain or shortness of breath No nausea, vomiting, diarrhea or blood in the stools No dysuria or gross hematuria Ambulatory BPs within normal when checked.     Objective:   Physical Exam General -- alert, well-developed. NAD.   Neck --no thyromegaly Lungs -- normal respiratory effort, no intercostal retractions, no accessory muscle use, and normal breath sounds.   Heart-- normal rate, regular rhythm, no murmur, and no gallop.   Abdomen--soft, non-tender, no distention, no masses, no HSM, no guarding, and no rigidity.   Extremities-- no pretibial edema bilaterally Rectal-- No external abnormalities noted. Normal sphincter tone. Brown stool Prostate:  Prostate gland firm and smooth, no enlargement, nodularity, tenderness, mass, asymmetry or induration. Neurologic-- alert & oriented X3 and strength normal in all extremities. Psych-- Cognition and judgment appear intact. Alert and cooperative with normal attention span and concentration.  not anxious appearing and not depressed appearing.         Assessment & Plan:

## 2011-11-30 NOTE — Telephone Encounter (Signed)
Refill done.  

## 2011-11-30 NOTE — Assessment & Plan Note (Signed)
Improving after CPAP

## 2011-11-30 NOTE — Assessment & Plan Note (Signed)
Last testosterone was slightly low with normal FSH, LH and prolactin. Declined to see the endocrinologist, he is feeling better now that he is on a CPAP and loosing some weight. Denies ED or decreased libido. Plan: Reassess in one year

## 2011-11-30 NOTE — Assessment & Plan Note (Addendum)
Td  10-2008 Flu shot today Pt reports 2 Cscope , last  11-11, next 01-2013 diet  and exercise --improving, praised  Labs

## 2011-11-30 NOTE — Patient Instructions (Addendum)
Continue with your healthier diet, try to exercise about 3 hours a week. Next visit in 5-6 months.

## 2011-11-30 NOTE — Assessment & Plan Note (Addendum)
Good compliance with meds, labs 

## 2012-02-12 ENCOUNTER — Telehealth: Payer: Self-pay | Admitting: Internal Medicine

## 2012-02-12 NOTE — Telephone Encounter (Signed)
lmovm for pt to return call.  

## 2012-02-12 NOTE — Telephone Encounter (Signed)
refills--Januvia--HCTZ & 1-touch ultra strips --- last ov wt/labs 9.20.13 V70  1-Januvia Tab 100MG  -- last wrt 6.14.13 #90 3-refills   2-HCTZ 25MG  tab last wrt 3.8.13 #90 wt/3-refills  3-one touch ultra test strips --last wrt #100 8.25.13 wt/11-refills  NOTE NEW PHARMACY ([facility] home delivery) IS REQUESTING

## 2012-02-13 NOTE — Telephone Encounter (Signed)
Please add 2-more refills to this request   Pioflitazone TAB 45MG  last wrt 5.13.13 #90 x 3-refills  &   Vytorin TAB 10-40MG  last wrt 9.20.13 #45 wt-6-refills

## 2012-02-14 MED ORDER — HYDROCHLOROTHIAZIDE 25 MG PO TABS: 25.0000 mg | ORAL_TABLET | Freq: Every day | ORAL | Status: DC

## 2012-02-14 MED ORDER — SITAGLIPTIN PHOSPHATE 100 MG PO TABS: 100.0000 mg | ORAL_TABLET | Freq: Every day | ORAL | Status: DC

## 2012-02-14 MED ORDER — GLUCOSE BLOOD VI STRP: ORAL_STRIP | Status: DC

## 2012-02-14 MED ORDER — LOSARTAN POTASSIUM 100 MG PO TABS: 50.0000 mg | ORAL_TABLET | Freq: Every day | ORAL | Status: DC

## 2012-02-14 MED ORDER — PIOGLITAZONE HCL 45 MG PO TABS: 45.0000 mg | ORAL_TABLET | Freq: Every day | ORAL | Status: DC

## 2012-02-14 MED ORDER — EZETIMIBE-SIMVASTATIN 10-40 MG PO TABS: ORAL_TABLET | ORAL | Status: DC

## 2012-02-14 NOTE — Telephone Encounter (Signed)
Refill: Losartan tab 100 mg.  Request from [facility]

## 2012-02-14 NOTE — Telephone Encounter (Signed)
Discussed with pt. He verified change in pharmacy. Refill sent in to [place].

## 2012-05-12 ENCOUNTER — Ambulatory Visit (INDEPENDENT_AMBULATORY_CARE_PROVIDER_SITE_OTHER): Payer: BC Managed Care – PPO | Admitting: Internal Medicine

## 2012-05-12 VITALS — BP 136/82 | HR 77 | Wt 279.0 lb

## 2012-05-12 DIAGNOSIS — G4733 Obstructive sleep apnea (adult) (pediatric): Secondary | ICD-10-CM

## 2012-05-12 DIAGNOSIS — E785 Hyperlipidemia, unspecified: Secondary | ICD-10-CM

## 2012-05-12 DIAGNOSIS — I10 Essential (primary) hypertension: Secondary | ICD-10-CM

## 2012-05-12 DIAGNOSIS — E119 Type 2 diabetes mellitus without complications: Secondary | ICD-10-CM

## 2012-05-12 LAB — CBC WITH DIFFERENTIAL/PLATELET
Basophils Absolute: 0 10*3/uL (ref 0.0–0.1)
Eosinophils Absolute: 0.1 10*3/uL (ref 0.0–0.7)
Hemoglobin: 13.2 g/dL (ref 13.0–17.0)
Lymphocytes Relative: 21.1 % (ref 12.0–46.0)
MCHC: 33.8 g/dL (ref 30.0–36.0)
MCV: 90.4 fl (ref 78.0–100.0)
Monocytes Absolute: 0.5 10*3/uL (ref 0.1–1.0)
Neutro Abs: 3.7 10*3/uL (ref 1.4–7.7)
Neutrophils Relative %: 68.3 % (ref 43.0–77.0)
RDW: 12.6 % (ref 11.5–14.6)

## 2012-05-12 LAB — BASIC METABOLIC PANEL
BUN: 22 mg/dL (ref 6–23)
CO2: 28 mEq/L (ref 19–32)
Calcium: 9 mg/dL (ref 8.4–10.5)
GFR: 90.9 mL/min (ref >60.00)
Glucose, Bld: 142 mg/dL — ABNORMAL HIGH (ref 70–99)

## 2012-05-12 MED ORDER — PANTOPRAZOLE SODIUM 40 MG PO TBEC: 40.0000 mg | DELAYED_RELEASE_TABLET | Freq: Every day | ORAL | Status: DC | PRN

## 2012-05-12 MED ORDER — EZETIMIBE-SIMVASTATIN 10-40 MG PO TABS: ORAL_TABLET | ORAL | Status: DC

## 2012-05-12 MED ORDER — LOSARTAN POTASSIUM 100 MG PO TABS: 50.0000 mg | ORAL_TABLET | Freq: Every day | ORAL | Status: DC

## 2012-05-12 MED ORDER — HYDROCHLOROTHIAZIDE 25 MG PO TABS: 25.0000 mg | ORAL_TABLET | Freq: Every day | ORAL | Status: DC

## 2012-05-12 MED ORDER — PIOGLITAZONE HCL 45 MG PO TABS: 45.0000 mg | ORAL_TABLET | Freq: Every day | ORAL | Status: DC

## 2012-05-12 MED ORDER — SITAGLIPTIN PHOSPHATE 100 MG PO TABS: 100.0000 mg | ORAL_TABLET | Freq: Every day | ORAL | Status: DC

## 2012-05-12 NOTE — Assessment & Plan Note (Addendum)
Good compliance with medication Counseled about feet care , diet and exercise. Labs, refills provided

## 2012-05-12 NOTE — Assessment & Plan Note (Signed)
Good compliance with Vytorin, no apparent side effects, refill provided

## 2012-05-12 NOTE — Assessment & Plan Note (Signed)
Good compliance with CPAP, still sees the benefit of using a CPAP

## 2012-05-12 NOTE — Patient Instructions (Addendum)
you are doing well  Focus on your diet and keep exercising with your wife !   Diabetes and Foot Care Diabetes may cause you to have a poor blood supply (circulation) to your legs and feet. Because of this, the skin may be thinner, break easier, and heal more slowly. You also may have nerve damage in your legs and feet causing decreased feeling. You may not notice minor injuries to your feet that could lead to serious problems or infections. Taking care of your feet is one of the most important things you can do for yourself.  HOME CARE INSTRUCTIONS  Do not go barefoot. Bare feet are easily injured.  Check your feet daily for blisters, cuts, and redness.  Wash your feet with warm water (not hot) and mild soap. Pat your feet and between your toes until completely dry.  Apply a moisturizing lotion that does not contain alcohol or petroleum jelly to the dry skin on your feet and to dry brittle toenails. Do not put it between your toes.  Trim your toenails straight across. Do not dig under them or around the cuticle.  Do not cut corns or calluses, or try to remove them with medicine.  Wear clean cotton socks or stockings every day. Make sure they are not too tight. Do not wear knee high stockings since they may decrease blood flow to your legs.  Wear leather shoes that fit properly and have enough cushioning. To break in new shoes, wear them just a few hours a day to avoid injuring your feet.  Wear shoes at all times, even in the house.  Do not cross your legs. This may decrease the blood flow to your feet.  If you find a minor scrape, cut, or break in the skin on your feet, keep it and the skin around it clean and dry. These areas may be cleansed with mild soap and water. Do not use peroxide, alcohol, iodine or Merthiolate.  When you remove an adhesive bandage, be sure not to harm the skin around it.  If you have a wound, look at it several times a day to make sure it is healing.  Do  not use heating pads or hot water bottles. Burns can occur. If you have lost feeling in your feet or legs, you may not know it is happening until it is too late.  Report any cuts, sores or bruises to your caregiver. Do not wait! SEEK MEDICAL CARE IF:   You have an injury that is not healing or you notice redness, numbness, burning, or tingling.  Your feet always feel cold.  You have pain or cramps in your legs and feet. SEEK IMMEDIATE MEDICAL CARE IF:   There is increasing redness, swelling, or increasing pain in the wound.  There is a red line that goes up your leg.  Pus is coming from a wound.  You develop an unexplained oral temperature above 102 F (38.9 C), or as your caregiver suggests.  You notice a bad smell coming from an ulcer or wound. MAKE SURE YOU:   Understand these instructions.  Will watch your condition.  Will get help right away if you are not doing well or get worse. Document Released: 02/24/2000 Document Revised: 05/21/2011 Document Reviewed: 09/01/2008 [facility] Patient Information 2013 [place], [place].

## 2012-05-12 NOTE — Progress Notes (Signed)
  Subjective:    Patient ID: Anthony [name], male    DOB: 12/22/1955, 57 y.o.   MRN: [id number]  HPI Routine office visit Sleep apnea, good medication with the CPAP, it definitely helps his energy level Diabetes, ambulatory to sugars range from 140 to 107 depending on his diet. Good compliance with medication High cholesterol, good compliance with Vytorin, needs a refill. Hypertension, good medication with compliance, ambulatory BP is usually 130/80   Past Medical History  Diagnosis Date  . Diabetes mellitus   . Hypertension   . Hyperlipidemia   . GERD (gastroesophageal reflux disease)   . DJD (degenerative joint disease)     per MRI of LS spine 2005  . Chronic back pain   . Hypogonadism male     dx w/ hypogonadism elsewhere ~ 2011  . Normal cardiac stress test     (-) stress test 2003, 2006  . S/P cardiac cath      04-2006 neg per pt--CP improved w/ PPIs   Past Surgical History  Procedure Laterality Date  . Cystectomy      from back    History   Social History  . Marital Status: Married    Spouse Name: N/A    Number of Children: 2  . Years of Education: N/A   Occupational History  . TRUCK DRIVER    Social History Main Topics  . Smoking status: Former Smoker    Types: Cigars    Quit date: 03/12/1981  . Smokeless tobacco: Never Used     Comment: smoked 1-2 cigers per week x 1 yr.    . Alcohol Use: Yes     Comment: occassinally  . Drug Use: No  . Sexually Active: Not on file   Other Topics Concern  . Not on file   Social History Narrative  . No narrative on file     Review of Systems He did very well with his diet, per his own scales, he went down to 259 at some point, now is back to 279. Denies chest pain, mild shortness of breath when he gained weight. He changed jobs, adapting to the new schedule, taking walks with his wife. No nausea, vomiting, diarrhea.    Objective:   Physical Exam BP 136/82  Pulse 77  Wt 279 lb (126.554 kg)  BMI 36.31 kg/m2   SpO2 96%  General -- alert, well-developed   Neck --no thyromegaly Lungs -- normal respiratory effort, no intercostal retractions, no accessory muscle use, and normal breath sounds.   Heart-- normal rate, regular rhythm, no murmur, and no gallop.   Extremities-- no pretibial edema bilaterally Neurologic-- alert & oriented X3 and strength normal in all extremities. Psych-- Cognition and judgment appear intact. Alert and cooperative with normal attention span and concentration.  not anxious appearing and not depressed appearing.      Assessment & Plan:

## 2012-05-12 NOTE — Assessment & Plan Note (Addendum)
Very good BP control, check a BMP, and refill medicines

## 2012-05-13 ENCOUNTER — Encounter: Payer: Self-pay | Admitting: Internal Medicine

## 2012-06-16 ENCOUNTER — Telehealth: Payer: Self-pay | Admitting: *Deleted

## 2012-06-16 MED ORDER — SIMVASTATIN 80 MG PO TABS: 80.0000 mg | ORAL_TABLET | Freq: Every day | ORAL | Status: DC

## 2012-06-16 NOTE — Telephone Encounter (Signed)
Pt left VM that he would like for Dr Drue Novel to review his med list and change all his med to generic. Pt is requesting this due to changes in insurance plan.Please advise

## 2012-06-16 NOTE — Telephone Encounter (Signed)
Discussed with pt. Pt is going to call insurance company to find out what meds are on his preferred med list that can replace Januvia. Samples are left at front desk for pt to come by & pick up.  Prescription for simvastatin 80mg  sent to pharmacy.

## 2012-06-16 NOTE — Telephone Encounter (Signed)
lmovm for pt to return call.  

## 2012-06-16 NOTE — Telephone Encounter (Signed)
Unfortunately no generics for Actos, Januvia; provide samples which could help temporarily. Hhe could stop Vytorin 10-40 and start simvastatin 80 mg daily. Call any a prescription on this patient interested; if he decides to change, needs office visit in 3 months to monitor his blood. Other medicines are generic (losartan, pantoprazole, etc.)

## 2012-06-19 ENCOUNTER — Telehealth: Payer: Self-pay | Admitting: Internal Medicine

## 2012-06-19 NOTE — Telephone Encounter (Signed)
Pt's insurance is requiring a prior auth. Med change to simvastatin 80mg . (see previous phone note)

## 2012-06-19 NOTE — Telephone Encounter (Signed)
VYTORIN 10-40 MG TABLET QTY: 45  TAKE 1/2 TABLET ONCE DAILY

## 2012-07-21 ENCOUNTER — Emergency Department (HOSPITAL_BASED_OUTPATIENT_CLINIC_OR_DEPARTMENT_OTHER)
Admission: EM | Admit: 2012-07-21 | Discharge: 2012-07-21 | Disposition: A | Discharge status: Home / Self Care | Payer: BC Managed Care – PPO | Attending: Emergency Medicine | Admitting: Emergency Medicine

## 2012-07-21 ENCOUNTER — Emergency Department (HOSPITAL_BASED_OUTPATIENT_CLINIC_OR_DEPARTMENT_OTHER): Payer: BC Managed Care – PPO

## 2012-07-21 ENCOUNTER — Encounter (HOSPITAL_BASED_OUTPATIENT_CLINIC_OR_DEPARTMENT_OTHER): Payer: Self-pay | Admitting: *Deleted

## 2012-07-21 DIAGNOSIS — I10 Essential (primary) hypertension: Secondary | ICD-10-CM | POA: Insufficient documentation

## 2012-07-21 DIAGNOSIS — Z7982 Long term (current) use of aspirin: Secondary | ICD-10-CM | POA: Insufficient documentation

## 2012-07-21 DIAGNOSIS — Z9861 Coronary angioplasty status: Secondary | ICD-10-CM | POA: Insufficient documentation

## 2012-07-21 DIAGNOSIS — Z8639 Personal history of other endocrine, nutritional and metabolic disease: Secondary | ICD-10-CM | POA: Insufficient documentation

## 2012-07-21 DIAGNOSIS — M1990 Unspecified osteoarthritis, unspecified site: Secondary | ICD-10-CM | POA: Insufficient documentation

## 2012-07-21 DIAGNOSIS — Z862 Personal history of diseases of the blood and blood-forming organs and certain disorders involving the immune mechanism: Secondary | ICD-10-CM | POA: Insufficient documentation

## 2012-07-21 DIAGNOSIS — G8929 Other chronic pain: Secondary | ICD-10-CM | POA: Insufficient documentation

## 2012-07-21 DIAGNOSIS — Z87891 Personal history of nicotine dependence: Secondary | ICD-10-CM | POA: Insufficient documentation

## 2012-07-21 DIAGNOSIS — K219 Gastro-esophageal reflux disease without esophagitis: Secondary | ICD-10-CM | POA: Insufficient documentation

## 2012-07-21 DIAGNOSIS — E119 Type 2 diabetes mellitus without complications: Secondary | ICD-10-CM | POA: Insufficient documentation

## 2012-07-21 DIAGNOSIS — R002 Palpitations: Secondary | ICD-10-CM

## 2012-07-21 DIAGNOSIS — Z79899 Other long term (current) drug therapy: Secondary | ICD-10-CM | POA: Insufficient documentation

## 2012-07-21 DIAGNOSIS — M549 Dorsalgia, unspecified: Secondary | ICD-10-CM | POA: Insufficient documentation

## 2012-07-21 DIAGNOSIS — E785 Hyperlipidemia, unspecified: Secondary | ICD-10-CM | POA: Insufficient documentation

## 2012-07-21 LAB — COMPREHENSIVE METABOLIC PANEL
ALT: 19 U/L (ref 0–53)
AST: 17 U/L (ref 0–37)
Albumin: 4.3 g/dL (ref 3.5–5.2)
CO2: 29 mEq/L (ref 19–32)
Calcium: 9.9 mg/dL (ref 8.4–10.5)
Chloride: 99 mEq/L (ref 96–112)
GFR calc non Af Amer: >90 mL/min (ref >90)
Sodium: 138 mEq/L (ref 135–145)

## 2012-07-21 LAB — CBC WITH DIFFERENTIAL/PLATELET
Basophils Absolute: 0 10*3/uL (ref 0.0–0.1)
Eosinophils Relative: 0 % (ref 0–5)
Lymphocytes Relative: 13 % (ref 12–46)
Neutro Abs: 11.2 10*3/uL — ABNORMAL HIGH (ref 1.7–7.7)
Platelets: 279 10*3/uL (ref 150–400)
RDW: 12.6 % (ref 11.5–15.5)
WBC: 14.9 10*3/uL — ABNORMAL HIGH (ref 4.0–10.5)

## 2012-07-21 LAB — TROPONIN I: Troponin I: <0.3 ng/mL (ref <0.30)

## 2012-07-21 NOTE — ED Notes (Signed)
Sob and tachycardia x 1 hour while taking a shower. Symptoms started after standing up and getting dizzy.

## 2012-07-21 NOTE — ED Provider Notes (Signed)
History  This chart was scribed for Glynn Octave, MD by Shari Heritage, ED Scribe. The patient was seen in room MH01/MH01. Patient's care was started at 1825.   CSN: [id number]  Arrival date & time 07/21/12  7829   First MD Initiated Contact with Patient 07/21/12 1825      Chief Complaint  Patient presents with  . Shortness of Breath     The history is provided by the patient. No language interpreter was used.    HPI Comments: Anthony [name] is a 57 y.o. male with history of hypertension, diabetes, hyperlipidemia, GERD who presents to the Emergency Department complaining of constant, moderate shortness of breath and persistent tachycardia onset less than 2 hours ago. He says that symptoms have since resolved and episode lasted 45 minutes to 1 hour. Patient states that symptoms began prior to a shower after standing up and becoming dizzy, and worsened while he was in the shower. He states that he has been having these episodes on and off for the past few months but all have resolved spontaneously. He states that he became concerned today because he has a family history of cardiac disease. He denies chest pain, nausea, vomiting, diarrhea or any other symptoms. He has been eating and drinking well today. He drives a truck locally for work. He hasn't taken any other [place] car or plane trips.  He states that his glucose levels are well controlled. He had cardiac catheterization and stress test about 4 years ago with Dr. Jones Broom. He denies any personal cardiac history. Patient is a former smoker (quit date: 66). He uses alcohol occasionally.    Past Medical History  Diagnosis Date  . Diabetes mellitus   . Hypertension   . Hyperlipidemia   . GERD (gastroesophageal reflux disease)   . DJD (degenerative joint disease)     per MRI of LS spine 2005  . Chronic back pain   . Hypogonadism male     dx w/ hypogonadism elsewhere ~ 2011  . Normal cardiac stress test     (-) stress test 2003, 2006  .  S/P cardiac cath      04-2006 neg per pt--CP improved w/ PPIs    Past Surgical History  Procedure Laterality Date  . Cystectomy      from back    Family History  Problem Relation Age of Onset  . Colon polyps Brother   . Heart disease Brother     CABG  . Heart disease Father     CABG    History  Substance Use Topics  . Smoking status: Former Smoker    Types: Cigars    Quit date: 03/12/1981  . Smokeless tobacco: Never Used     Comment: smoked 1-2 cigers per week x 1 yr.    . Alcohol Use: Yes     Comment: occassinally      Review of Systems A complete 10 system review of systems was obtained and all systems are negative except as noted in the HPI and PMH.   Allergies  Review of patient's allergies indicates no known allergies.  Home Medications   Current Outpatient Rx  Name  Route  Sig  Dispense  Refill  . aspirin 81 MG tablet   Oral   Take 81 mg by mouth daily.           Marland Kitchen ezetimibe-simvastatin (VYTORIN) 10-40 MG per tablet      Take one-half tablet daily.   45 tablet   3   .  glucose blood (ONE TOUCH ULTRA TEST) test strip      Use as instructed   100 each   11     Check once daily.   . hydrochlorothiazide (HYDRODIURIL) 25 MG tablet   Oral   Take 1 tablet (25 mg total) by mouth daily.   90 tablet   3   . losartan (COZAAR) 100 MG tablet   Oral   Take 0.5 tablets (50 mg total) by mouth daily.   45 tablet   3   . pantoprazole (PROTONIX) 40 MG tablet   Oral   Take 1 tablet (40 mg total) by mouth daily as needed.   90 tablet   3   . pioglitazone (ACTOS) 45 MG tablet   Oral   Take 1 tablet (45 mg total) by mouth daily.   90 tablet   3   . simvastatin (ZOCOR) 80 MG tablet   Oral   Take 1 tablet (80 mg total) by mouth at bedtime.   90 tablet   0   . sitaGLIPtin (JANUVIA) 100 MG tablet   Oral   Take 1 tablet (100 mg total) by mouth daily.   90 tablet   3     Triage Vitals: BP 113/80  Pulse 116  Temp(Src) 98.8 F (37.1 C)  (Oral)  Resp 20  Wt 275 lb (124.739 kg)  BMI 35.79 kg/m2  SpO2 95%  Physical Exam  Constitutional: He is oriented to person, place, and time. He appears well-developed and well-nourished. No distress.  HENT:  Head: Normocephalic and atraumatic.  Mouth/Throat: Oropharynx is clear and moist.  Moist mucous membranes.  Eyes: Conjunctivae and EOM are normal. Pupils are equal, round, and reactive to light.  Neck: Normal range of motion. Neck supple.  Cardiovascular: Regular rhythm and normal heart sounds.  Tachycardia present.   No murmur heard. Equal peripheral pulses. Few PACs on monitor. Irregular rhythm  Pulmonary/Chest: Effort normal and breath sounds normal. No respiratory distress. He has no wheezes. He has no rales.  Abdominal: Soft. There is no tenderness.  Musculoskeletal: Normal range of motion.  Neurological: He is alert and oriented to person, place, and time.  Skin: Skin is warm and dry. He is not diaphoretic.  Psychiatric: He has a normal mood and affect. His behavior is normal.    ED Course  Procedures (including critical care time) DIAGNOSTIC STUDIES: Oxygen Saturation is 95% on room air, adequate by my interpretation.    COORDINATION OF CARE: 6:35 PM- Patient informed of current plan for treatment and evaluation and agrees with plan at this time.   9:34 PM- Updated patient on labs and imaging scans which do not indicate MI and that patient is stable for discharge. Explained that I have discussed case with [place] who have advised patient to follow up in the office.    Labs Reviewed  CBC WITH DIFFERENTIAL - Abnormal; Notable for the following:    WBC 14.9 (*)    Neutro Abs 11.2 (*)    Monocytes Absolute 1.7 (*)    All other components within normal limits  COMPREHENSIVE METABOLIC PANEL - Abnormal; Notable for the following:    Glucose, Bld 147 (*)    All other components within normal limits  TROPONIN I  D-DIMER, QUANTITATIVE  TROPONIN I    Dg Chest 2  View  07/21/2012  *RADIOLOGY REPORT*  Clinical Data: Shortness of breath, tachycardia, chest pain  CHEST - 2 VIEW  Comparison: None.  Findings: No active infiltrate or  effusion is seen.  Mediastinal contours appear normal.  The heart is within normal limits in size. No bony abnormality is noted.  IMPRESSION: No active lung disease.   Original Report Authenticated By: Dwyane Dee, M.D.      1. Palpitations       MDM  Episode of palpitations, shortness of breath that started during his shower. Similar episodes in the past that have resolved spontaneously. Denies cardiac history. Negative catheterization in 2010 by his report.  Troponin negative. D-dimer negative. Sinus arrhythmia on the monitor with PACs. Variable heart rate in the 80s to 110s.  Repeat EKG confirms sinus arrhythmia with first-degree A-V block.  Patient's symptoms have resolved. No dizziness, chest pain or shortness of breath. Denies palpitations. Discussed with Dr. Charm Barges of cardiology who will arrange followup for patient this week.    Date: 07/21/2012  Rate: 98  Rhythm: normal sinus rhythm  QRS Axis: normal  Intervals: PR prolonged  ST/T Wave abnormalities: normal  Conduction Disutrbances:none  Narrative Interpretation:   Old EKG Reviewed: unchanged   Date: 07/21/2012 2016  Rate: 95  Rhythm: sinus arrhythmia and premature atrial contractions (PAC)  QRS Axis: normal  Intervals: PR prolonged  ST/T Wave abnormalities: normal  Conduction Disutrbances:first-degree A-V block   Narrative Interpretation:   Old EKG Reviewed: changes noted     I personally performed the services described in this documentation, which was scribed in my presence. The recorded information has been reviewed and is accurate.         Glynn Octave, MD 07/21/12 2251

## 2012-07-23 ENCOUNTER — Encounter: Payer: Self-pay | Admitting: Cardiovascular Disease

## 2012-07-23 ENCOUNTER — Ambulatory Visit (INDEPENDENT_AMBULATORY_CARE_PROVIDER_SITE_OTHER): Payer: BC Managed Care – PPO | Admitting: Cardiovascular Disease

## 2012-07-23 VITALS — BP 127/80 | HR 81 | Ht 75.0 in | Wt 279.0 lb

## 2012-07-23 DIAGNOSIS — I471 Supraventricular tachycardia: Secondary | ICD-10-CM | POA: Insufficient documentation

## 2012-07-23 DIAGNOSIS — I498 Other specified cardiac arrhythmias: Secondary | ICD-10-CM

## 2012-07-23 DIAGNOSIS — I10 Essential (primary) hypertension: Secondary | ICD-10-CM

## 2012-07-23 DIAGNOSIS — E785 Hyperlipidemia, unspecified: Secondary | ICD-10-CM

## 2012-07-23 MED ORDER — ATENOLOL 50 MG PO TABS: 50.0000 mg | ORAL_TABLET | Freq: Every day | ORAL | Status: DC

## 2012-07-23 NOTE — Assessment & Plan Note (Signed)
Well controlled.  Continue current medications and low sodium Dash type diet.   Stop diuretic as low K and Mg may contribute to his arrhythmia

## 2012-07-23 NOTE — Assessment & Plan Note (Signed)
Given ECG I suspect he is having bursts of SVT  F/U echo and event monitor Start beta blocker and f/u with EPS

## 2012-07-23 NOTE — Patient Instructions (Addendum)
You have been referred   NEEDS TO SEE EP  ASAP FOR SVT Your physician has recommended you make the following change in your medication:  STOP HYRDROCHLOROTHIAZIDE  START TOPROL  50 MG  1 EVERY DAY  Your physician has requested that you have an echocardiogram. Echocardiography is a painless test that uses sound waves to create images of your heart. It provides your doctor with information about the size and shape of your heart and how well your heart's chambers and valves are working. This procedure takes approximately one hour. There are no restrictions for this procedure. Your physician has recommended that you wear an event monitor. Event monitors are medical devices that record the heart's electrical activity. Doctors most often [place] these monitors to diagnose arrhythmias. Arrhythmias are problems with the speed or rhythm of the heartbeat. The monitor is a small, portable device. You can wear one while you do your normal daily activities. This is usually used to diagnose what is causing palpitations/syncope (passing out).

## 2012-07-23 NOTE — Progress Notes (Signed)
Patient ID: Anthony [name], male   DOB: 1955/09/16, 57 y.o.   MRN: [id number] Anthony [name] is a 57 y.o. male with history of hypertension, diabetes, hyperlipidemia, GERD who presents to the Emergency Department 5/12  complaining of constant, moderate shortness of breath and persistent tachycardia . He says that symptoms have since resolved and episode lasted 45 minutes to 1 hour. Patient states that symptoms began prior to a shower after standing up and becoming dizzy, and worsened while he was in the shower. He states that he has been having these episodes on and off for the past few months but all have resolved spontaneously. He states that he became concerned  because he has a family history of cardiac disease. He denies chest pain, nausea, vomiting, diarrhea or any other symptoms. He has been eating and drinking well today. He drives a truck locally for work. He hasn't taken any other [place] car or plane trips. He states that his glucose levels are well controlled. He had cardiac catheterization and stress test about 4 years ago with Dr. Jones Broom. He denies any personal cardiac history. Patient is a former smoker (quit date: 3). He uses alcohol occasionally.   Cath 2008 Dr Teressa Lower:  catheterization, which showed normal coronary  arteries, except for some, perhaps, mild LAD vasospasm.  Ventriculography showed an EF of 50% with some apical hypokineses. He  underwent followup echocardiogram which confirmed an EF of 50%-55% with  mild hypokinesis of the periapical wall. There was some mild-to-  moderate aortic root dilatation, about 4.5 mm. After his  catheterization, we started him on some Imdur for a question of spasm as  well as some Protonix   ROS: Denies fever, malais, weight loss, blurry vision, decreased visual acuity, cough, sputum, SOB, hemoptysis, pleuritic pain, palpitaitons, heartburn, abdominal pain, melena, lower extremity edema, claudication, or rash.  All other systems reviewed and  negative   General: Affect appropriate Healthy:  appears stated age HEENT: normal Neck supple with no adenopathy JVP normal no bruits no thyromegaly Lungs clear with no wheezing and good diaphragmatic motion Heart:  S1/S2 no murmur,rub, gallop or click PMI normal Abdomen: benighn, BS positve, no tenderness, no AAA no bruit.  No HSM or HJR Distal pulses intact with no bruits No edema Neuro non-focal Skin warm and dry No muscular weakness  Medications Current Outpatient Prescriptions  Medication Sig Dispense Refill  . aspirin 81 MG tablet Take 81 mg by mouth daily.        Marland Kitchen ezetimibe-simvastatin (VYTORIN) 10-40 MG per tablet Take one-half tablet daily.  45 tablet  3  . glucose blood (ONE TOUCH ULTRA TEST) test strip Use as instructed  100 each  11  . hydrochlorothiazide (HYDRODIURIL) 25 MG tablet Take 1 tablet (25 mg total) by mouth daily.  90 tablet  3  . losartan (COZAAR) 100 MG tablet Take 0.5 tablets (50 mg total) by mouth daily.  45 tablet  3  . pioglitazone (ACTOS) 45 MG tablet Take 1 tablet (45 mg total) by mouth daily.  90 tablet  3  . simvastatin (ZOCOR) 80 MG tablet Take 1 tablet (80 mg total) by mouth at bedtime.  90 tablet  0   No current facility-administered medications for this visit.    Allergies Review of patient's allergies indicates no known allergies.  Family History: Family History  Problem Relation Age of Onset  . Colon polyps Brother   . Heart disease Brother     CABG  . Heart disease Father  CABG    Social History: History   Social History  . Marital Status: Married    Spouse Name: N/A    Number of Children: 2  . Years of Education: N/A   Occupational History  . TRUCK DRIVER    Social History Main Topics  . Smoking status: Former Smoker    Types: Cigars    Quit date: 03/12/1981  . Smokeless tobacco: Never Used     Comment: smoked 1-2 cigers per week x 1 yr.    . Alcohol Use: Yes     Comment: occassinally  . Drug Use: No  .  Sexually Active: Not on file   Other Topics Concern  . Not on file   Social History Narrative  . No narrative on file    Electrocardiogram:  SR frequent PAC;s and short bursts ? SVT type rhythm no flutter/fib or ischemia  Assessment and Plan

## 2012-07-23 NOTE — Assessment & Plan Note (Signed)
Cholesterol is at goal.  Continue current dose of statin and diet Rx.  No myalgias or side effects.  F/U  LFT's in 6 months. Lab Results  Component Value Date   LDLCALC 89 11/30/2011

## 2012-08-05 ENCOUNTER — Encounter (INDEPENDENT_AMBULATORY_CARE_PROVIDER_SITE_OTHER): Payer: BC Managed Care – PPO

## 2012-08-05 ENCOUNTER — Ambulatory Visit (HOSPITAL_COMMUNITY): Payer: BC Managed Care – PPO | Attending: Cardiology | Admitting: Radiology

## 2012-08-05 ENCOUNTER — Encounter: Payer: Self-pay | Admitting: *Deleted

## 2012-08-05 DIAGNOSIS — I059 Rheumatic mitral valve disease, unspecified: Secondary | ICD-10-CM | POA: Insufficient documentation

## 2012-08-05 DIAGNOSIS — E785 Hyperlipidemia, unspecified: Secondary | ICD-10-CM | POA: Insufficient documentation

## 2012-08-05 DIAGNOSIS — I079 Rheumatic tricuspid valve disease, unspecified: Secondary | ICD-10-CM | POA: Insufficient documentation

## 2012-08-05 DIAGNOSIS — I471 Supraventricular tachycardia, unspecified: Secondary | ICD-10-CM | POA: Insufficient documentation

## 2012-08-05 DIAGNOSIS — I10 Essential (primary) hypertension: Secondary | ICD-10-CM | POA: Insufficient documentation

## 2012-08-05 DIAGNOSIS — I498 Other specified cardiac arrhythmias: Secondary | ICD-10-CM

## 2012-08-05 DIAGNOSIS — G4733 Obstructive sleep apnea (adult) (pediatric): Secondary | ICD-10-CM | POA: Insufficient documentation

## 2012-08-05 DIAGNOSIS — Z87891 Personal history of nicotine dependence: Secondary | ICD-10-CM | POA: Insufficient documentation

## 2012-08-05 NOTE — Progress Notes (Signed)
Echocardiogram performed.  

## 2012-08-05 NOTE — Progress Notes (Signed)
Patient ID: Anthony [name], male   DOB: December 17, 1955, 56 y.o.   MRN: [id number] 21 Day e-cardio monitor placed on patient.

## 2012-08-13 ENCOUNTER — Ambulatory Visit (INDEPENDENT_AMBULATORY_CARE_PROVIDER_SITE_OTHER): Payer: BC Managed Care – PPO | Admitting: Internal Medicine

## 2012-08-13 ENCOUNTER — Encounter: Payer: Self-pay | Admitting: Internal Medicine

## 2012-08-13 VITALS — BP 128/75 | HR 64 | Ht 75.0 in | Wt 282.0 lb

## 2012-08-13 DIAGNOSIS — I7789 Other specified disorders of arteries and arterioles: Secondary | ICD-10-CM

## 2012-08-13 DIAGNOSIS — I471 Supraventricular tachycardia: Secondary | ICD-10-CM

## 2012-08-13 DIAGNOSIS — I498 Other specified cardiac arrhythmias: Secondary | ICD-10-CM

## 2012-08-13 MED ORDER — VERAPAMIL HCL ER 120 MG PO CP24: 120.0000 mg | ORAL_CAPSULE | Freq: Every day | ORAL | Status: DC

## 2012-08-13 MED ORDER — METOPROLOL TARTRATE 50 MG PO TABS: 50.0000 mg | ORAL_TABLET | Freq: Two times a day (BID) | ORAL | Status: DC

## 2012-08-13 MED ORDER — METOPROLOL SUCCINATE ER 50 MG PO TB24: 50.0000 mg | ORAL_TABLET | Freq: Every day | ORAL | Status: DC

## 2012-08-13 NOTE — Progress Notes (Signed)
ELECTROPHYSIOLOGY CONSULT NOTE  Patient ID: Anthony [name], MRN: [id number], DOB/AGE: 10/02/1955 57 y.o. Admit date: (Not on file) Date of Consult: 08/13/2012  Primary Physician: Willow Ora, MD Primary Cardiologist: PN  Chief Complaint:  tachycardia   HPI Anthony [name] is a 57 y.o. male  Seen for arrhythmia. He was noted  In may of visit today with [facility] to have nonsustained atrial tachycardia with couplets and triplets. He had presented with significant tachycardia palpitations associated with chest pain lightheadedness and shortness of breath.  There is concern for this represents atrial fibrillation and hence he is referred. An event recorder his ongoing currently.  He has a history of chest pain with typical sounding features with radiation to the neck and shoulders. It is not necessarily related to exertion but prompted prior cardiac evaluation includes a catheterization 2008 demonstrating normal coronary arteries with ventriculogram demonstrated an EF of 50% with some apical hypokinesis. Echocardiogram confirmed the above; there is mild to moderate aortic root dilatation.  Echocardiogram 514 demonstrated normal left ventricular function. Aortic root was again noted as dilated; left atrial dimension was 51  Is a family history notable for aortic root dilatation    Past medical history notable for hypertension diabetes dyslipidemia.   Past Medical History  Diagnosis Date  . Diabetes mellitus   . Hypertension   . Hyperlipidemia   . GERD (gastroesophageal reflux disease)   . DJD (degenerative joint disease)     per MRI of LS spine 2005  . Chronic back pain   . Hypogonadism male     dx w/ hypogonadism elsewhere ~ 2011  . Normal cardiac stress test     (-) stress test 2003, 2006  . S/P cardiac cath      04-2006 neg per pt--CP improved w/ PPIs      Surgical History:  Past Surgical History  Procedure Laterality Date  . Cystectomy      from back     Home Meds: Prior to  Admission medications   Medication Sig Start Date End Date Taking? Authorizing Provider  aspirin 81 MG tablet Take 81 mg by mouth daily.     Yes Historical Provider, MD  atenolol (TENORMIN) 50 MG tablet Take 1 tablet (50 mg total) by mouth daily. 07/23/12  Yes Wendall Stade, MD  ezetimibe-simvastatin (VYTORIN) 10-40 MG per tablet Take one-half tablet daily. 05/12/12  Yes Wanda Plump, MD  losartan (COZAAR) 100 MG tablet Take 0.5 tablets (50 mg total) by mouth daily. 05/12/12  Yes Wanda Plump, MD  pioglitazone (ACTOS) 45 MG tablet Take 1 tablet (45 mg total) by mouth daily. 05/12/12  Yes Wanda Plump, MD  glucose blood (ONE TOUCH ULTRA TEST) test strip Use as instructed 02/14/12   Wanda Plump, MD  simvastatin (ZOCOR) 80 MG tablet Take 1 tablet (80 mg total) by mouth at bedtime. 06/16/12   Wanda Plump, MD      Allergies: No Known Allergies  History   Social History  . Marital Status: Married    Spouse Name: N/A    Number of Children: 2  . Years of Education: N/A   Occupational History  . TRUCK DRIVER    Social History Main Topics  . Smoking status: Former Smoker    Types: Cigars    Quit date: 03/12/1981  . Smokeless tobacco: Never Used     Comment: smoked 1-2 cigers per week x 1 yr.    . Alcohol Use: Yes  Comment: occassinally  . Drug Use: No  . Sexually Active: Not on file   Other Topics Concern  . Not on file   Social History Narrative  . No narrative on file     Family History  Problem Relation Age of Onset  . Colon polyps Brother   . Heart disease Brother     CABG  . Heart disease Father     CABG     ROS:  Please see the history of present illness.     All other systems reviewed and negative.    Physical Exam:   Blood pressure 128/75, pulse 64, height 6\' 3"  (1.905 m), weight 282 lb (127.914 kg). General: Well developed, well nourished morbidly obese male in no acute distress. Head: Normocephalic, atraumatic, sclera non-icteric, no xanthomas, nares are without  discharge. EENT: normal Lymph Nodes:  none Back: without scoliosis/kyphosis  no CVA tendersness Neck: Negative for carotid bruits. JVD6-7 cm Lungs: Clear bilaterally to auscultation without wheezes, rales, or rhonchi. Breathing is unlabored. Heart: RRR with S1 S2. No murmur , rubs, or gallops appreciated. Abdomen: Soft, non-tender, non-distended with normoactive bowel sounds. No hepatomegaly. No rebound/guarding. No obvious abdominal masses. Msk:  Strength and tone appear normal for age. Extremities: No clubbing or cyanosis. 2+ edema.  Distal pedal pulses are 2+ and equal bilaterally. Skin: Warm and Dry Neuro: Alert and oriented X 3. CN III-XII intact Grossly normal sensory and motor function . Psych:  Responds to questions appropriately with a normal affect.      Labs: Cardiac Enzymes No results found for this basename: CKTOTAL, CKMB, TROPONINI,  in the last 72 hours CBC Lab Results  Component Value Date   WBC 14.9* 07/21/2012   HGB 13.9 07/21/2012   HCT 39.5 07/21/2012   MCV 91.2 07/21/2012   PLT 279 07/21/2012   PROTIME: No results found for this basename: LABPROT, INR,  in the last 72 hours Chemistry No results found for this basename: NA, K, CL, CO2, BUN, CREATININE, CALCIUM, LABALBU, PROT, BILITOT, ALKPHOS, ALT, AST, GLUCOSE,  in the last 168 hours Lipids Lab Results  Component Value Date   CHOL 150 11/30/2011   HDL 43.10 11/30/2011   LDLCALC 89 11/30/2011   TRIG 90.0 11/30/2011   BNP No results found for this basename: probnp   Miscellaneous Lab Results  Component Value Date   DDIMER <0.27 07/21/2012    Radiology/Studies:  Dg Chest 2 View  07/21/2012   *RADIOLOGY REPORT*  Clinical Data: Shortness of breath, tachycardia, chest pain  CHEST - 2 VIEW  Comparison: None.  Findings: No active infiltrate or effusion is seen.  Mediastinal contours appear normal.  The heart is within normal limits in size. No bony abnormality is noted.  IMPRESSION: No active lung disease.    Original Report Authenticated By: Dwyane Dee, M.D.    EKG:  Sinus rhtym  With atrial couplets    Assessment and Plan: *   Sherryl Manges

## 2012-08-13 NOTE — Assessment & Plan Note (Signed)
The patient has documented aortic root enlargement and 2008. Numbers this time although the smaller suggesting there may have been some measurements that were not exactly orthogonal. He has a family history of aortic root disease suggesting the possibility of inherited aortopathy. This essentially is the need for clarification as to his aortic root and we'll undertake CT scanning. I reviewed this with Dr. Jamse Mead.

## 2012-08-13 NOTE — Assessment & Plan Note (Signed)
The patient had prolonged tachycardia palpitations or irregular and fast. Upon arrival to the [facility] ED he was noted to have atrial couplets and triplets and a presumption of atrial fibrillation was raised. He was seen the next day by Dr. Eden Emms who put him on a beta blocker which has been complicated by fatigue there is also been associated with a marked diminution in his palpitations.  We will continue utilizing event recorder. With his high blood pressure and diabetes he would be appropriate to consider for anticoagulation is atrial fibrillation were identified; aspirin this regard would be deemed inadequate.  Given his fatigue with the atenolol, we will discontinue it. I've given a prescription for metoprolol 50 in both a succinate and tartrate forms as well as verapamil 120. He will take been in random order and will followup with Dr. Jamse Mead. In the event that sustained arrhythmias documented we would be glad to see him to consider further treatment options.

## 2012-08-13 NOTE — Patient Instructions (Addendum)
Your physician recommends that you schedule a follow-up appointment in: 6 weeks with Dr. Eden Emms  START in random order:  Metoprolol Tartrate 50mg  twice a day                                           Metoprolol Succinate 50mg  daily                                           Verapamil 120mg  ER daily   CT Angio of the Aorta

## 2012-08-19 ENCOUNTER — Ambulatory Visit (INDEPENDENT_AMBULATORY_CARE_PROVIDER_SITE_OTHER)
Admission: RE | Admit: 2012-08-19 | Discharge: 2012-08-19 | Disposition: A | Discharge status: Home / Self Care | Payer: BC Managed Care – PPO | Source: Ambulatory Visit | Attending: Internal Medicine | Admitting: Internal Medicine

## 2012-08-19 DIAGNOSIS — I7789 Other specified disorders of arteries and arterioles: Secondary | ICD-10-CM

## 2012-08-19 MED ORDER — IOHEXOL 350 MG/ML SOLN
100.0000 mL | Freq: Once | INTRAVENOUS | Status: AC | PRN
  Administered 2012-08-19: 100 mL via INTRAVENOUS

## 2012-08-24 ENCOUNTER — Encounter: Payer: Self-pay | Admitting: Internal Medicine

## 2012-08-28 ENCOUNTER — Telehealth: Payer: Self-pay | Admitting: Internal Medicine

## 2012-08-28 NOTE — Telephone Encounter (Signed)
Follow Up     Calling in following up on test results. Please call.

## 2012-08-28 NOTE — Telephone Encounter (Signed)
New problem    Pt calling for ct scan results

## 2012-08-29 NOTE — Telephone Encounter (Signed)
See result note on ct scan. Message left for pt to call back

## 2012-09-01 ENCOUNTER — Ambulatory Visit: Payer: BC Managed Care – PPO | Admitting: Cardiovascular Disease

## 2012-09-01 ENCOUNTER — Telehealth: Payer: Self-pay | Admitting: *Deleted

## 2012-09-01 NOTE — Telephone Encounter (Signed)
F/u    Pt returning your calling regarding test results

## 2012-09-01 NOTE — Telephone Encounter (Signed)
PT  AWARE OF MONITOR  RESULTS  PER  DR NISHAN  NSR NO SIG  ARRHYTHMIAS./CY 

## 2012-09-01 NOTE — Telephone Encounter (Signed)
[facility] lpn has given the pt his results. Follow up scheduled

## 2012-10-31 ENCOUNTER — Encounter: Payer: Self-pay | Admitting: Gastroenterology

## 2012-11-03 ENCOUNTER — Ambulatory Visit (INDEPENDENT_AMBULATORY_CARE_PROVIDER_SITE_OTHER): Payer: BC Managed Care – PPO | Admitting: Cardiovascular Disease

## 2012-11-03 ENCOUNTER — Encounter: Payer: Self-pay | Admitting: Cardiovascular Disease

## 2012-11-03 VITALS — BP 130/83 | HR 52 | Ht 75.0 in | Wt 287.0 lb

## 2012-11-03 DIAGNOSIS — I471 Supraventricular tachycardia: Secondary | ICD-10-CM

## 2012-11-03 DIAGNOSIS — E785 Hyperlipidemia, unspecified: Secondary | ICD-10-CM

## 2012-11-03 DIAGNOSIS — E119 Type 2 diabetes mellitus without complications: Secondary | ICD-10-CM

## 2012-11-03 DIAGNOSIS — I7789 Other specified disorders of arteries and arterioles: Secondary | ICD-10-CM

## 2012-11-03 DIAGNOSIS — I498 Other specified cardiac arrhythmias: Secondary | ICD-10-CM

## 2012-11-03 MED ORDER — LOSARTAN POTASSIUM 25 MG PO TABS: 25.0000 mg | ORAL_TABLET | Freq: Every day | ORAL | Status: DC

## 2012-11-03 NOTE — Progress Notes (Signed)
Patient ID: Anthony [name], male   DOB: [birth year], 57 y.o.   MRN: [id number] 57 yo patient of Dr Teressa Lower and Graciela Husbands Seen in 2008 with normal cath EF 50%  Having atrial arrhythmias with ? SVT and atrial couplets  Seen by SK 08/13/12 And atenolol changed to option of different beta blocker or verapamil  He settled on atenolol 50 mg dialy anyway and has no more fatigue    His BP has been low and he "experimented with stopping his Cozaar.  I let him know that the ARB helps his kidneys with diabetes as well  History of aortic root dilatation  CT scan done this month Reviewed and Sinus measurement only 4.1 cm and root ok    Echo 5/14 normal EF  Study Conclusions  - Left ventricle: The cavity size was normal. There was mild focal basal hypertrophy of the septum. Systolic function was normal. The estimated ejection fraction was in the range of 55% to 60%. Wall motion was normal; there were no regional wall motion abnormalities. Left ventricular diastolic function parameters were normal. - Aortic root: The aortic root was mildly dilated. - Mitral valve: There was mild systolic anterior motion of the chordal structures. - Left atrium: The atrium was mildly dilated.  ROS: Denies fever, malais, weight loss, blurry vision, decreased visual acuity, cough, sputum, SOB, hemoptysis, pleuritic pain, palpitaitons, heartburn, abdominal pain, melena, lower extremity edema, claudication, or rash.  All other systems reviewed and negative  General: Affect appropriate Healthy:  appears stated age HEENT: normal Neck supple with no adenopathy JVP normal no bruits no thyromegaly Lungs clear with no wheezing and good diaphragmatic motion Heart:  S1/S2 no murmur, no rub, gallop or click PMI normal Abdomen: benighn, BS positve, no tenderness, no AAA no bruit.  No HSM or HJR Distal pulses intact with no bruits No edema Neuro non-focal Skin warm and dry No muscular weakness   Current Outpatient Prescriptions   Medication Sig Dispense Refill  . aspirin 81 MG tablet Take 81 mg by mouth daily.        Marland Kitchen atenolol (TENORMIN) 50 MG tablet Take 1 tablet (50 mg total) by mouth daily.  30 tablet  11  . ezetimibe-simvastatin (VYTORIN) 10-40 MG per tablet Take one-half tablet every other day      . glucose blood (ONE TOUCH ULTRA TEST) test strip Use as instructed  100 each  11  . pioglitazone (ACTOS) 45 MG tablet Take 1 tablet (45 mg total) by mouth daily.  90 tablet  3   No current facility-administered medications for this visit.    Allergies  Review of patient's allergies indicates no known allergies.  Electrocardiogram:  SR rate 64 normal ECG   Assessment and Plan

## 2012-11-03 NOTE — Assessment & Plan Note (Signed)
In light of DM I told him he should be on ARB  Called in cozaar at lower dose 25mg 

## 2012-11-03 NOTE — Assessment & Plan Note (Addendum)
Vyotorin not covered by insurance.  Dr Drue Novel changed to zocor 40mg   I think generic lipitor 40 mg might be a better choice  LDL a year ago was 89 and I think half the lipitor dose would be better tolerated and as effective

## 2012-11-03 NOTE — Assessment & Plan Note (Signed)
Mild 4.1 SV measurement F/U CT/MRA in 2 years

## 2012-11-03 NOTE — Patient Instructions (Addendum)
Your physician wants you to follow-up in:    6 MONTHS WITH DR Haywood Filler will receive a reminder letter in the mail two months in advance. If you don't receive a letter, please call our office to schedule the follow-up appointment. Your physician has recommended you make the following change in your medication:  DECREASE  LOSARTAN TO 25 MG

## 2012-11-03 NOTE — Assessment & Plan Note (Signed)
Stable on atenolol Normal EF

## 2012-11-03 NOTE — Assessment & Plan Note (Signed)
Dont like his actos  It adds weight and edema.  F/U PAZ to change to alternative oral hypoglycemic

## 2012-11-05 ENCOUNTER — Telehealth: Payer: Self-pay | Admitting: General Practice

## 2012-11-05 MED ORDER — METFORMIN HCL 850 MG PO TABS: 850.0000 mg | ORAL_TABLET | Freq: Two times a day (BID) | ORAL | Status: DC

## 2012-11-05 MED ORDER — ATORVASTATIN CALCIUM 40 MG PO TABS: 40.0000 mg | ORAL_TABLET | Freq: Every day | ORAL | Status: DC

## 2012-11-05 NOTE — Telephone Encounter (Signed)
Pt called stating that Dr. Fabio Bering office would like to change some of the pt's medications. Pt stated that the meds that need changing are:   actos - due to it causing build-up of fluid and weight gain in some people Losartan - this medication was lowered to 25 mg Lipitor 40mg  - Dr. Eden Emms would like pt to start this med.   Please advise if these changes are ok?

## 2012-11-05 NOTE — Telephone Encounter (Signed)
Spoke with patient: I agree to discontinue Actos and re-star metformin, see prescription Will stop simvastatin, start Lipitor 40 mg, new prescription sent. Losartan dose was lowered to 25 mg by cardiology. Next visit in approximately one month. Patient in agreement

## 2012-11-12 ENCOUNTER — Encounter: Payer: BC Managed Care – PPO | Admitting: Internal Medicine

## 2012-12-01 ENCOUNTER — Encounter: Payer: Self-pay | Admitting: Internal Medicine

## 2012-12-01 ENCOUNTER — Ambulatory Visit (INDEPENDENT_AMBULATORY_CARE_PROVIDER_SITE_OTHER): Payer: BC Managed Care – PPO | Admitting: Internal Medicine

## 2012-12-01 VITALS — BP 124/77 | HR 70 | Temp 98.2°F | Ht 74.1 in | Wt 279.4 lb

## 2012-12-01 DIAGNOSIS — E291 Testicular hypofunction: Secondary | ICD-10-CM

## 2012-12-01 DIAGNOSIS — Z2911 Encounter for prophylactic immunotherapy for respiratory syncytial virus (RSV): Secondary | ICD-10-CM

## 2012-12-01 DIAGNOSIS — Z0000 Encounter for general adult medical examination without abnormal findings: Secondary | ICD-10-CM

## 2012-12-01 DIAGNOSIS — J309 Allergic rhinitis, unspecified: Secondary | ICD-10-CM

## 2012-12-01 DIAGNOSIS — I4719 Other supraventricular tachycardia: Secondary | ICD-10-CM

## 2012-12-01 DIAGNOSIS — I471 Supraventricular tachycardia: Secondary | ICD-10-CM

## 2012-12-01 DIAGNOSIS — Z23 Encounter for immunization: Secondary | ICD-10-CM

## 2012-12-01 DIAGNOSIS — E119 Type 2 diabetes mellitus without complications: Secondary | ICD-10-CM

## 2012-12-01 LAB — COMPREHENSIVE METABOLIC PANEL
ALT: 24 U/L (ref 0–53)
Albumin: 4.3 g/dL (ref 3.5–5.2)
CO2: 29 mEq/L (ref 19–32)
Calcium: 9.1 mg/dL (ref 8.4–10.5)
Chloride: 102 mEq/L (ref 96–112)
Creatinine, Ser: 0.8 mg/dL (ref 0.4–1.5)
GFR: 102.15 mL/min (ref >60.00)
Potassium: 4.3 mEq/L (ref 3.5–5.1)
Total Protein: 7.1 g/dL (ref 6.0–8.3)

## 2012-12-01 LAB — CBC WITH DIFFERENTIAL/PLATELET
Basophils Absolute: 0 10*3/uL (ref 0.0–0.1)
Eosinophils Absolute: 0.1 10*3/uL (ref 0.0–0.7)
Hemoglobin: 13.6 g/dL (ref 13.0–17.0)
Lymphocytes Relative: 18.6 % (ref 12.0–46.0)
MCHC: 33.9 g/dL (ref 30.0–36.0)
Monocytes Relative: 7.8 % (ref 3.0–12.0)
Neutrophils Relative %: 71.9 % (ref 43.0–77.0)
Platelets: 251 10*3/uL (ref 150.0–400.0)
RDW: 12.9 % (ref 11.5–14.6)

## 2012-12-01 LAB — LIPID PANEL
Cholesterol: 146 mg/dL (ref 0–200)
HDL: 41.3 mg/dL (ref >39.00)
Triglycerides: 139 mg/dL (ref 0.0–149.0)

## 2012-12-01 LAB — TSH: TSH: 0.85 u[IU]/mL (ref 0.35–5.50)

## 2012-12-01 MED ORDER — METFORMIN HCL ER 750 MG PO TB24: 1500.0000 mg | ORAL_TABLET | Freq: Every day | ORAL | Status: DC

## 2012-12-01 MED ORDER — FLUTICASONE PROPIONATE 50 MCG/ACT NA SUSP: 2.0000 | Freq: Every day | NASAL | Status: DC

## 2012-12-01 NOTE — Assessment & Plan Note (Addendum)
Td  10-2008 zostavax Pt reports 2 Cscope , last  11-11, next is due  01-2013, pt already got a letter from GI diet  and exercise -- request a "diet pill" however I recommend another trial with diet, encourage him to think about the Atkins diet for short-term followup by the [employer]. Labs

## 2012-12-01 NOTE — Assessment & Plan Note (Signed)
A couple of weeks ago, Actos was changed to metformin due to fluid retention. On metformin he is having diarrhea. Plan: Change metformin to the ER formulation. If not better will switch to onglyza or similar meds

## 2012-12-01 NOTE — Assessment & Plan Note (Signed)
Symptoms increased lately, will prescribe Flonase

## 2012-12-01 NOTE — Patient Instructions (Addendum)
Get your blood work before you leave  Next visit in  3 months   for a check up Please make an appointment before you leave the office today (or call few weeks in advance)  ---- Think about the Atkins diet short-term and possibly the [employer] --- Flonase 2 sprays in each side of the nose every day. Call if not better. --- If the new metformin formulation is not helping with diarrhea, let me know in 2 or 3 weeks.

## 2012-12-01 NOTE — Assessment & Plan Note (Signed)
Having issues with ED only on and off, we agreed to simply recheck a testosterone level.

## 2012-12-01 NOTE — Progress Notes (Signed)
  Subjective:    Patient ID: Anthony [name], male    DOB: 1955/12/15, 57 y.o.   MRN: [id number]  HPI CPX Past Medical History  Diagnosis Date  . Diabetes mellitus   . Hypertension   . Hyperlipidemia   . GERD (gastroesophageal reflux disease)   . DJD (degenerative joint disease)     per MRI of LS spine 2005  . Chronic back pain   . Hypogonadism male     dx w/ hypogonadism elsewhere ~ 2011  . Normal cardiac stress test     (-) stress test 2003, 2006  . S/P cardiac cath      04-2006 neg per pt--CP improved w/ PPIs  . Aortic root enlargement     CT 42 mm 2014  . Tachycardia     ER 07-2012, ?SCR, on atenolol   Past Surgical History  Procedure Laterality Date  . Cystectomy      from back   History   Social History  . Marital Status: Married    Spouse Name: N/A    Number of Children: 2  . Years of Education: N/A   Occupational History  . TRUCK DRIVER    Social History Main Topics  . Smoking status: Former Smoker    Types: Cigars    Quit date: 03/12/1981  . Smokeless tobacco: Never Used     Comment: smoked 1-2 cigers per week x 1 yr.    . Alcohol Use: Yes     Comment: occassinally  . Drug Use: No  . Sexual Activity: Not on file   Other Topics Concern  . Not on file   Social History Narrative  . No narrative on file   Family History  Problem Relation Age of Onset  . Colon polyps Brother   . Heart disease Brother     CABG  . Heart disease Father     CABG  . Diabetes Other     cousins  . Prostate cancer Neg Hx     Review of Systems Diet-- trying to eat healthy Exercise-- gym/treadmil x2-3/week No  CP, SOB, lower extremity edema, DOE No nausea, vomiting + diarrhea since metformin, loose stools 3-4/day No blood in the stools No cough, sputum production. + RN today No wheezing, chest congestion No dysuria, gross hematuria, some difficulty urinating on-off  No anxiety, depression      Objective:   Physical Exam BP 124/77  Pulse 70  Temp(Src) 98.2 F  (36.8 C)  Ht 6' 2.1" (1.882 m)  Wt 279 lb 6.4 oz (126.735 kg)  BMI 35.78 kg/m2  SpO2 99%  General -- alert, well-developed, NAD.  HEENT-- Not pale. TMs normal, throat symmetric, no redness or discharge. Face symmetric, sinuses not tender to palpation. Nose quite congested. Lungs -- normal respiratory effort, no intercostal retractions, no accessory muscle use, and normal breath sounds.  Heart-- normal rate, regular rhythm, no murmur.  Abdomen-- Not distended, good bowel sounds,soft, non-tender. No rebound or rigidity. Rectal-- declined d/t having issues w/ hemorrhoids Extremities-- no pretibial edema bilaterally  Neurologic--  alert & oriented X3.  Speech normal, gait normal, strength normal in all extremities.  Psych-- Cognition and judgment appear intact. Cooperative with normal attention span and concentration. No anxious appearing , no depressed appearing.      Assessment & Plan:

## 2012-12-01 NOTE — Assessment & Plan Note (Addendum)
started with problems with tachycardia around 07/2012. Followup by cardiology, currently seems to be doing well

## 2012-12-02 ENCOUNTER — Telehealth: Payer: Self-pay | Admitting: Internal Medicine

## 2012-12-02 NOTE — Telephone Encounter (Signed)
Advise patient, his diabetes needs better control, call onglyza 5 mg 1 po daily , call #[phone], 3 RF. Also tell him I released his results to mychart with my comments.

## 2012-12-04 ENCOUNTER — Other Ambulatory Visit: Payer: Self-pay | Admitting: *Deleted

## 2012-12-04 MED ORDER — SAXAGLIPTIN HCL 5 MG PO TABS: 5.0000 mg | ORAL_TABLET | Freq: Every day | ORAL | Status: DC

## 2012-12-04 NOTE — Telephone Encounter (Signed)
Prescription sent. DJR  

## 2012-12-29 ENCOUNTER — Other Ambulatory Visit: Payer: Self-pay | Admitting: *Deleted

## 2012-12-29 MED ORDER — GLUCOSE BLOOD VI STRP: ORAL_STRIP | Status: DC

## 2012-12-29 NOTE — Telephone Encounter (Signed)
One Touch Ultra glucose strips sent to pharmacy for refill

## 2013-01-15 ENCOUNTER — Other Ambulatory Visit: Payer: Self-pay

## 2013-03-02 ENCOUNTER — Ambulatory Visit (INDEPENDENT_AMBULATORY_CARE_PROVIDER_SITE_OTHER): Payer: BC Managed Care – PPO | Admitting: Internal Medicine

## 2013-03-02 ENCOUNTER — Encounter: Payer: Self-pay | Admitting: Internal Medicine

## 2013-03-02 VITALS — BP 106/70 | HR 65 | Temp 98.5°F | Wt 268.0 lb

## 2013-03-02 DIAGNOSIS — Z23 Encounter for immunization: Secondary | ICD-10-CM

## 2013-03-02 DIAGNOSIS — E119 Type 2 diabetes mellitus without complications: Secondary | ICD-10-CM

## 2013-03-02 DIAGNOSIS — I10 Essential (primary) hypertension: Secondary | ICD-10-CM

## 2013-03-02 LAB — AST: AST: 16 U/L (ref 0–37)

## 2013-03-02 LAB — HEMOGLOBIN A1C: Hgb A1c MFr Bld: 7.2 % — ABNORMAL HIGH (ref 4.6–6.5)

## 2013-03-02 LAB — ALT: ALT: 20 U/L (ref 0–53)

## 2013-03-02 NOTE — Progress Notes (Signed)
Pre visit review using our clinic review tool, if applicable. No additional management support is needed unless otherwise documented below in the visit note. 

## 2013-03-02 NOTE — Patient Instructions (Signed)
Get your blood work before you leave  Next visit for a  follow up regards diabetes -cholesterol ,   fasting, in 4 months Please make an appointment    Check the  blood pressure 2 or 3 times a  week be sure it is between 110/60 and 140/85. Ideal blood pressure is 120/80. If it is consistently higher or lower, let me know, may need to cut down on your losartan

## 2013-03-02 NOTE — Assessment & Plan Note (Signed)
Based on the last A1c, he was recommend onglyza ($$$) but went back on Actos and and  continue with a regular metformin. Currently tolerating medication well, he has made major changes in his lifestyle and has lost weight---> praised  He is doing great, continue with same medications, labs.

## 2013-03-02 NOTE — Progress Notes (Signed)
   Subjective:    Patient ID: Anthony [name], male    DOB: 10/10/1955, 57 y.o.   MRN: [id number]  HPI ROV Since the last time he was here, he was unable to take onglyza  ($$), currently on metformin and Actos, doing great with diet and exercise, has lost weight, ambulatory blood sugars in the low 100s.  Past Medical History  Diagnosis Date  . Diabetes mellitus   . Hypertension   . Hyperlipidemia   . GERD (gastroesophageal reflux disease)   . DJD (degenerative joint disease)     per MRI of LS spine 2005  . Chronic back pain   . Hypogonadism male     dx w/ hypogonadism elsewhere ~ 2011  . Normal cardiac stress test     (-) stress test 2003, 2006  . S/P cardiac cath      04-2006 neg per pt--CP improved w/ PPIs  . Aortic root enlargement     CT 42 mm 2014  . Tachycardia     ER 07-2012, ?SCR, on atenolol   Past Surgical History  Procedure Laterality Date  . Cystectomy      from back     Review of Systems Ambulatory BPs always < 110-80. Denies any dizziness, weakness or fatigue. No nausea, vomiting, diarrhea    Objective:   Physical Exam  BP 106/70  Pulse 65  Temp(Src) 98.5 F (36.9 C)  Wt 268 lb (121.564 kg)  SpO2 100% General -- alert, well-developed, NAD.  Lungs -- normal respiratory effort, no intercostal retractions, no accessory muscle use, and normal breath sounds.  Heart-- normal rate, regular rhythm, no murmur.  Extremities-- no pretibial edema bilaterally  Neurologic--  alert & oriented X3.  Psych-- Cognition and judgment appear intact. Cooperative with normal attention span and concentration. No anxious appearing , no depressed appearing.      Assessment & Plan:

## 2013-03-02 NOTE — Assessment & Plan Note (Signed)
Well controlled with current meds, BP is actually slightly low but he is asymptomatic.  Likely BP better due to better lifestyle. See instructions

## 2013-06-04 ENCOUNTER — Ambulatory Visit (INDEPENDENT_AMBULATORY_CARE_PROVIDER_SITE_OTHER): Payer: BC Managed Care – PPO | Admitting: Cardiovascular Disease

## 2013-06-04 ENCOUNTER — Encounter: Payer: Self-pay | Admitting: Cardiovascular Disease

## 2013-06-04 VITALS — BP 130/94 | HR 70 | Ht 75.0 in | Wt 277.0 lb

## 2013-06-04 DIAGNOSIS — I10 Essential (primary) hypertension: Secondary | ICD-10-CM

## 2013-06-04 DIAGNOSIS — E785 Hyperlipidemia, unspecified: Secondary | ICD-10-CM

## 2013-06-04 DIAGNOSIS — I7789 Other specified disorders of arteries and arterioles: Secondary | ICD-10-CM

## 2013-06-04 DIAGNOSIS — E119 Type 2 diabetes mellitus without complications: Secondary | ICD-10-CM

## 2013-06-04 DIAGNOSIS — I471 Supraventricular tachycardia: Secondary | ICD-10-CM

## 2013-06-04 DIAGNOSIS — I498 Other specified cardiac arrhythmias: Secondary | ICD-10-CM

## 2013-06-04 NOTE — Patient Instructions (Signed)
Your physician wants you to follow-up in: YEAR WITH DR NISHAN  You will receive a reminder letter in the mail two months in advance. If you don't receive a letter, please call our office to schedule the follow-up appointment.  Your physician recommends that you continue on your current medications as directed. Please refer to the Current Medication list given to you today. 

## 2013-06-04 NOTE — Assessment & Plan Note (Signed)
Maint NSR with no palpitations continue atenolol

## 2013-06-04 NOTE — Assessment & Plan Note (Signed)
Well controlled.  Continue current medications and low sodium Dash type diet.   In Weitzman run may benefit from low dose diuretic

## 2013-06-04 NOTE — Assessment & Plan Note (Signed)
Cholesterol is at goal.  Continue current dose of statin and diet Rx.  No myalgias or side effects.  F/U  LFT's in 6 months. Lab Results  Component Value Date   LDLCALC 77 12/01/2012

## 2013-06-04 NOTE — Assessment & Plan Note (Signed)
Discussed low carb diet.  Target hemoglobin A1c is 6.5 or less.  Continue current medications.  

## 2013-06-04 NOTE — Progress Notes (Signed)
Patient ID: MASON DIBIASIO, male   DOB: 1955-03-15, 58 y.o.   MRN: [id number] 58 yo patient of Dr Jeffie Pollock and Caryl Comes Seen in 2008 with normal cath EF 50% Having atrial arrhythmias with ? SVT and atrial couplets Seen by SK 08/13/12  And atenolol changed to option of different beta blocker or verapamil He settled on atenolol 50 mg dialy anyway and has no more fatigue His BP has been low and he "experimented with stopping his Cozaar. I let him know that the ARB helps his kidneys with diabetes as well  History of aortic root dilatation CT scan done this month  Reviewed and Sinus measurement only 4.1 cm and root ok  Echo 5/14 normal EF  Study Conclusions  - Left ventricle: The cavity size was normal. There was mild focal basal hypertrophy of the septum. Systolic function was normal. The estimated ejection fraction was in the range of 55% to 60%. Wall motion was normal; there were no regional wall motion abnormalities. Left ventricular diastolic function parameters were normal. - Aortic root: The aortic root was mildly dilated. - Mitral valve: There was mild systolic anterior motion of the chordal structures. - Left atrium: The atrium was mildly dilated.      ROS: Denies fever, malais, weight loss, blurry vision, decreased visual acuity, cough, sputum, SOB, hemoptysis, pleuritic pain, palpitaitons, heartburn, abdominal pain, melena, lower extremity edema, claudication, or rash.  All other systems reviewed and negative  General: Affect appropriate Healthy:  appears stated age 58: normal Neck supple with no adenopathy JVP normal no bruits no thyromegaly Lungs clear with no wheezing and good diaphragmatic motion Heart:  S1/S2 no murmur, no rub, gallop or click PMI normal Abdomen: benighn, BS positve, no tenderness, no AAA no bruit.  No HSM or HJR Distal pulses intact with no bruits No edema Neuro non-focal Skin warm and dry No muscular weakness   Current Outpatient Prescriptions   Medication Sig Dispense Refill  . aspirin 81 MG tablet Take 81 mg by mouth daily.        Marland Kitchen atenolol (TENORMIN) 50 MG tablet Take 1 tablet (50 mg total) by mouth daily.  30 tablet  11  . atorvastatin (LIPITOR) 40 MG tablet Take 1 tablet (40 mg total) by mouth daily.  30 tablet  3  . glucose blood (ONE TOUCH ULTRA TEST) test strip Use as instructed  100 each  11  . losartan (COZAAR) 25 MG tablet Take 1 tablet (25 mg total) by mouth daily.  30 tablet  11  . metFORMIN (GLUCOPHAGE) 850 MG tablet Take 850 mg by mouth 2 (two) times daily with a meal.      . pioglitazone (ACTOS) 45 MG tablet Take 45 mg by mouth daily.       No current facility-administered medications for this visit.    Allergies  Review of patient's allergies indicates no known allergies.  Electrocardiogram:  6.4.14  SR rate 64 normal   Assessment and Plan

## 2013-06-04 NOTE — Assessment & Plan Note (Signed)
F/U echo in a year No AR murmur  BP under good control

## 2013-06-16 ENCOUNTER — Other Ambulatory Visit: Payer: Self-pay | Admitting: Internal Medicine

## 2013-06-18 ENCOUNTER — Other Ambulatory Visit: Payer: Self-pay

## 2013-07-01 ENCOUNTER — Ambulatory Visit: Payer: BC Managed Care – PPO | Admitting: Internal Medicine

## 2013-07-24 ENCOUNTER — Ambulatory Visit: Payer: BC Managed Care – PPO | Admitting: Internal Medicine

## 2013-07-29 ENCOUNTER — Other Ambulatory Visit: Payer: Self-pay | Admitting: *Deleted

## 2013-07-29 MED ORDER — ATENOLOL 50 MG PO TABS: 50.0000 mg | ORAL_TABLET | Freq: Every day | ORAL | Status: DC

## 2013-08-08 ENCOUNTER — Encounter: Payer: Self-pay | Admitting: Gastroenterology

## 2013-08-17 ENCOUNTER — Other Ambulatory Visit: Payer: Self-pay | Admitting: Internal Medicine

## 2013-08-21 ENCOUNTER — Encounter: Payer: Self-pay | Admitting: Internal Medicine

## 2013-08-21 ENCOUNTER — Encounter: Payer: Self-pay | Admitting: Gastroenterology

## 2013-08-22 ENCOUNTER — Other Ambulatory Visit: Payer: Self-pay | Admitting: Internal Medicine

## 2013-09-07 ENCOUNTER — Encounter: Payer: Self-pay | Admitting: Internal Medicine

## 2013-09-07 ENCOUNTER — Ambulatory Visit (INDEPENDENT_AMBULATORY_CARE_PROVIDER_SITE_OTHER): Payer: BC Managed Care – PPO | Admitting: Internal Medicine

## 2013-09-07 VITALS — BP 114/73 | HR 69 | Temp 98.2°F | Wt 281.0 lb

## 2013-09-07 DIAGNOSIS — E785 Hyperlipidemia, unspecified: Secondary | ICD-10-CM

## 2013-09-07 DIAGNOSIS — E119 Type 2 diabetes mellitus without complications: Secondary | ICD-10-CM

## 2013-09-07 DIAGNOSIS — I10 Essential (primary) hypertension: Secondary | ICD-10-CM

## 2013-09-07 LAB — BASIC METABOLIC PANEL
BUN: 19 mg/dL (ref 6–23)
CALCIUM: 9 mg/dL (ref 8.4–10.5)
CO2: 27 mEq/L (ref 19–32)
CREATININE: 0.9 mg/dL (ref 0.4–1.5)
Chloride: 104 mEq/L (ref 96–112)
GFR: 91.6 mL/min (ref >60.00)
Glucose, Bld: 151 mg/dL — ABNORMAL HIGH (ref 70–99)
Potassium: 3.7 mEq/L (ref 3.5–5.1)
Sodium: 138 mEq/L (ref 135–145)

## 2013-09-07 LAB — LIPID PANEL
CHOLESTEROL: 144 mg/dL (ref 0–200)
HDL: 47.3 mg/dL (ref >39.00)
LDL Cholesterol: 76 mg/dL (ref 0–99)
NONHDL: 96.7
Total CHOL/HDL Ratio: 3
Triglycerides: 102 mg/dL (ref 0.0–149.0)
VLDL: 20.4 mg/dL (ref 0.0–40.0)

## 2013-09-07 LAB — ALT: ALT: 23 U/L (ref 0–53)

## 2013-09-07 LAB — AST: AST: 26 U/L (ref 0–37)

## 2013-09-07 LAB — HEMOGLOBIN A1C: HEMOGLOBIN A1C: 7.6 % — AB (ref 4.6–6.5)

## 2013-09-07 NOTE — Assessment & Plan Note (Signed)
Good tolerance to Lipitor 40 mg, due for labs

## 2013-09-07 NOTE — Patient Instructions (Signed)
Get your blood work before you leave     If you need more information about a healthy diet,  diabetes, hypertension visit  the American Heart Association, it  is a Financial planner at:  http://www.richard-flynn.net/  All about diabetes, great resource!  InsuranceTransaction.co.za.html  Next visit is for a physical exam by 11-2013  No need to come back fasting Please make an appointment     At some point this year the clinic will relocate to  [place],  corner of [address] (10 minutes form here)  [facility] [address]  [place], [place] [address] [phone]

## 2013-09-07 NOTE — Progress Notes (Signed)
Pre visit review using our clinic review tool, if applicable. No additional management support is needed unless otherwise documented below in the visit note. 

## 2013-09-07 NOTE — Assessment & Plan Note (Addendum)
Well-controlled,Continue losartan, BB, HCTZ

## 2013-09-07 NOTE — Assessment & Plan Note (Addendum)
Compliance with medications is very good. Feet exam today is negative Will check the A1c, goals discussed. We have again a conversation about diet and exercise. If not at goal, will consider again ongliza or similar  medications and will provide a coupon to offset cost

## 2013-09-07 NOTE — Progress Notes (Signed)
Subjective:    Patient ID: Anthony [name], male    DOB: Jun 13, 1955, 58 y.o.   MRN: [id number]  DOS:  09/07/2013 Type of  Visit: ROV History: DM, ambulatory but CBGs fasting 130, 140. Later during the day CBGs better at 118. High cholesterol, good medication compliance. Hypertension, good medication compliance, no apparent side effects. Recent note from cardiology reviewed, felt to be stable   ROS Denies chest pain or difficulty breathing No  nausea, vomiting, diarrhea. Denies any tingling or paresthesias in the lower extremities  Past Medical History  Diagnosis Date  . Diabetes mellitus   . Hypertension   . Hyperlipidemia   . GERD (gastroesophageal reflux disease)   . DJD (degenerative joint disease)     per MRI of LS spine 2005  . Chronic back pain   . Hypogonadism male     dx w/ hypogonadism elsewhere ~ 2011  . Normal cardiac stress test     (-) stress test 2003, 2006  . S/P cardiac cath      04-2006 neg per pt--CP improved w/ PPIs  . Aortic root enlargement     CT 42 mm 2014  . Tachycardia     ER 07-2012, ?SCR, on atenolol    Past Surgical History  Procedure Laterality Date  . Cystectomy      from back    History   Social History  . Marital Status: Married    Spouse Name: N/A    Number of Children: 2  . Years of Education: N/A   Occupational History  . TRUCK DRIVER    Social History Main Topics  . Smoking status: Former Smoker    Types: Cigars    Quit date: 03/12/1981  . Smokeless tobacco: Never Used     Comment: smoked 1-2 cigers per week x 1 yr.    . Alcohol Use: Yes     Comment: occassinally  . Drug Use: No  . Sexual Activity: Not on file   Other Topics Concern  . Not on file   Social History Narrative  . No narrative on file        Medication List       This list is accurate as of: 09/07/13 12:43 PM.  Always use your most recent med list.               aspirin 81 MG tablet  Take 81 mg by mouth daily.     atenolol 50 MG tablet    Commonly known as:  TENORMIN  Take 1 tablet (50 mg total) by mouth daily.     atorvastatin 40 MG tablet  Commonly known as:  LIPITOR  Take 1 tablet (40 mg total) by mouth daily.     glucose blood test strip  Commonly known as:  ONE TOUCH ULTRA TEST  Use as instructed     hydrochlorothiazide 25 MG tablet  Commonly known as:  HYDRODIURIL  TAKE 1 TABLET EVERY DAY     losartan 25 MG tablet  Commonly known as:  COZAAR  Take 1 tablet (25 mg total) by mouth daily.     metFORMIN 850 MG tablet  Commonly known as:  GLUCOPHAGE  TAKE 1 TABLET IN THE MORNING AND 2 TABLET WITH DINNER AS NEEDED     pioglitazone 45 MG tablet  Commonly known as:  ACTOS  TAKE 1 TABLET EVERY DAY           Objective:   Physical Exam BP 114/73  Pulse  69  Temp(Src) 98.2 F (36.8 C)  Wt 281 lb (127.461 kg)  SpO2 93%  General -- alert, well-developed, NAD.    Lungs -- normal respiratory effort, no intercostal retractions, no accessory muscle use, and normal breath sounds.  Heart-- normal rate, regular rhythm, no murmur.  DIABETIC FEET EXAM: No lower extremity edema Normal pedal pulses bilaterally Skin normal, nails normal, no calluses Pinprick examination of the feet normal.  Psych-- Cognition and judgment appear intact. Cooperative with normal attention span and concentration. No anxious or depressed appearing.       Assessment & Plan:

## 2013-09-08 ENCOUNTER — Telehealth: Payer: Self-pay | Admitting: Internal Medicine

## 2013-09-08 NOTE — Telephone Encounter (Signed)
Relevant patient education assigned to patient using Emmi. ° °

## 2013-09-10 ENCOUNTER — Encounter: Payer: Self-pay | Admitting: *Deleted

## 2013-09-10 MED ORDER — SAXAGLIPTIN HCL 5 MG PO TABS: 5.0000 mg | ORAL_TABLET | Freq: Every morning | ORAL | Status: DC

## 2013-09-10 NOTE — Addendum Note (Signed)
Addended by: Peggyann Shoals on: 09/10/2013 10:56 AM   Modules accepted: Orders

## 2013-09-17 ENCOUNTER — Telehealth: Payer: Self-pay | Admitting: Internal Medicine

## 2013-09-17 ENCOUNTER — Encounter: Payer: Self-pay | Admitting: Family Medicine

## 2013-09-17 ENCOUNTER — Ambulatory Visit (INDEPENDENT_AMBULATORY_CARE_PROVIDER_SITE_OTHER): Payer: BC Managed Care – PPO | Admitting: Family Medicine

## 2013-09-17 VITALS — BP 112/80 | HR 66 | Temp 98.0°F | Ht 75.0 in | Wt 283.5 lb

## 2013-09-17 DIAGNOSIS — T671XXD Heat syncope, subsequent encounter: Secondary | ICD-10-CM

## 2013-09-17 DIAGNOSIS — T671XXA Heat syncope, initial encounter: Secondary | ICD-10-CM

## 2013-09-17 DIAGNOSIS — H6980 Other specified disorders of Eustachian tube, unspecified ear: Secondary | ICD-10-CM

## 2013-09-17 DIAGNOSIS — J0110 Acute frontal sinusitis, unspecified: Secondary | ICD-10-CM

## 2013-09-17 DIAGNOSIS — Z5189 Encounter for other specified aftercare: Secondary | ICD-10-CM

## 2013-09-17 DIAGNOSIS — H6981 Other specified disorders of Eustachian tube, right ear: Secondary | ICD-10-CM

## 2013-09-17 DIAGNOSIS — H6990 Unspecified Eustachian tube disorder, unspecified ear: Secondary | ICD-10-CM

## 2013-09-17 MED ORDER — AMOXICILLIN-POT CLAVULANATE 875-125 MG PO TABS: 1.0000 | ORAL_TABLET | Freq: Two times a day (BID) | ORAL | Status: DC

## 2013-09-17 NOTE — Progress Notes (Signed)
No chief complaint on file.   HPI:  Acute visit for:  1)Sinusitis: -PCP not available -started 2-3 weeks ago -sinus congestion and sinus pain on and off for a few weeks, cough, thick mucus - yellow and green, has had 3-4 episodes of mild vertigo with quick movements that lasted a few seconds, ears full -denies:cp, palpitations, vomiting, HA, vision changes, diarrhea, SOB    2)Syncope: -occurred 5 days ago -first time exercising in a while and thinks he overdid it - went for hard bike ride in the heat and didn't take water -right after ride felt hot and overheated and had brief witnessed LOC without injury -no syncope since but wife wanted him to see doctor about this -denies: CP, SOB, palpitations (has chronic intermittent heart racing and sees Dr. [name] for this)  ROS: See pertinent positives and negatives per HPI.  Past Medical History  Diagnosis Date  . Diabetes mellitus   . Hypertension   . Hyperlipidemia   . GERD (gastroesophageal reflux disease)   . DJD (degenerative joint disease)     per MRI of LS spine 2005  . Chronic back pain   . Hypogonadism male     dx w/ hypogonadism elsewhere ~ 2011  . Normal cardiac stress test     (-) stress test 2003, 2006  . S/P cardiac cath      04-2006 neg per pt--CP improved w/ PPIs  . Aortic root enlargement     CT 42 mm 2014  . Tachycardia     ER 07-2012, ?SCR, on atenolol    Past Surgical History  Procedure Laterality Date  . Cystectomy      from back    Family History  Problem Relation Age of Onset  . Colon polyps Brother   . Heart disease Brother     CABG  . Heart disease Father     CABG  . Diabetes Other     cousins  . Prostate cancer Neg Hx     History   Social History  . Marital Status: Married    Spouse Name: N/A    Number of Children: 2  . Years of Education: N/A   Occupational History  . TRUCK DRIVER    Social History Main Topics  . Smoking status: Former Smoker    Types: Cigars    Quit date:  03/12/1981  . Smokeless tobacco: Never Used     Comment: smoked 1-2 cigers per week x 1 yr.    . Alcohol Use: Yes     Comment: occassinally  . Drug Use: No  . Sexual Activity: None   Other Topics Concern  . None   Social History Narrative  . None    Current outpatient prescriptions:aspirin 81 MG tablet, Take 81 mg by mouth daily.  , Disp: , Rfl: ;  atenolol (TENORMIN) 50 MG tablet, Take 1 tablet (50 mg total) by mouth daily., Disp: 30 tablet, Rfl: 11;  atorvastatin (LIPITOR) 40 MG tablet, Take 1 tablet (40 mg total) by mouth daily., Disp: 30 tablet, Rfl: 3;  glucose blood (ONE TOUCH ULTRA TEST) test strip, Use as instructed, Disp: 100 each, Rfl: 11 hydrochlorothiazide (HYDRODIURIL) 25 MG tablet, TAKE 1 TABLET EVERY DAY, Disp: 90 tablet, Rfl: 0;  losartan (COZAAR) 25 MG tablet, Take 1 tablet (25 mg total) by mouth daily., Disp: 30 tablet, Rfl: 11;  metFORMIN (GLUCOPHAGE) 850 MG tablet, TAKE 1 TABLET IN THE MORNING AND 2 TABLET WITH DINNER AS NEEDED, Disp: 60 tablet, Rfl: 3;  pioglitazone (ACTOS) 45 MG tablet, TAKE 1 TABLET EVERY DAY, Disp: 90 tablet, Rfl: 1 amoxicillin-clavulanate (AUGMENTIN) 875-125 MG per tablet, Take 1 tablet by mouth 2 (two) times daily., Disp: 20 tablet, Rfl: 0  EXAM:  Filed Vitals:   09/17/13 1256  BP: 112/80  Pulse: 66  Temp: 98 F (36.7 C)    Body mass index is 35.44 kg/(m^2).  GENERAL: vitals reviewed and listed above, alert, oriented, appears well hydrated and in no acute distress  HEENT: atraumatic, conjunttiva clear, no obvious abnormalities on inspection of external nose and ears, normal appearance of ear canals and TMs with clear effusion R, R>L thick nasal congestion, mild post oropharyngeal erythema with PND, no tonsillar edema or exudate, no sinus TTP  NECK: no obvious masses on inspection, no bruit  LUNGS: clear to auscultation bilaterally, no wheezes, rales or rhonchi, good air movement  CV: HRRR, no peripheral edema  MS: moves all  extremities without noticeable abnormality  PSYCH: pleasant and cooperative, no obvious depression or anxiety  NEURO: CN II-XI grossly intact, finger to nose normal, dix hallpike normal, gait normal  ASSESSMENT AND PLAN:  Discussed the following assessment and plan:  Acute frontal sinusitis, recurrence not specified - Plan: amoxicillin-clavulanate (AUGMENTIN) 875-125 MG per tablet  Heat syncope, subsequent encounter -discussed other causes of syncope, workup, return and emergency precuations and advised eval with neuro or cardiology - he has cardiologist and agrees to set up appointment  Eustachian tube dysfunction, right -2ndary to sinusitis  -Patient advised to return or notify a doctor immediately if symptoms worsen or persist or new concerns arise.  Patient Instructions  -call your cardiologist for an appointment for evaluation prior to further strenuous exercise - in the meantime avoid strenuous activity in the heat and stay hydrated, seek emergency care if CP, SOB, passing out or other concerns  -As we discussed, we have prescribed a new medication for you at this appointment. We discussed the common and serious potential adverse effects of this medication and you can review these and more with the pharmacist when you pick up your medication.  Please follow the instructions for use carefully and notify us immediately if you have any problems taking this medication.  -follow up with Dr. Larose Kells in 2 weeks or sooner as needed      Anthony Benton R.

## 2013-09-17 NOTE — Patient Instructions (Signed)
-  call your cardiologist for an appointment for evaluation prior to further strenuous exercise - in the meantime avoid strenuous activity in the heat and stay hydrated, seek emergency care if CP, SOB, passing out or other concerns  -As we discussed, we have prescribed a new medication for you at this appointment. We discussed the common and serious potential adverse effects of this medication and you can review these and more with the pharmacist when you pick up your medication.  Please follow the instructions for use carefully and notify us immediately if you have any problems taking this medication.  -follow up with Dr. Larose Kells in 2 weeks or sooner as needed
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
## 2013-09-17 NOTE — Progress Notes (Signed)
Pre visit review using our clinic review tool, if applicable. No additional management support is needed unless otherwise documented below in the visit note. 

## 2013-09-17 NOTE — Telephone Encounter (Signed)
Appt scheduled with Dr. Maudie Mercury today at 1pm
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
## 2013-09-25 ENCOUNTER — Encounter: Payer: BC Managed Care – PPO | Admitting: Gastroenterology

## 2013-10-15 ENCOUNTER — Encounter: Payer: BC Managed Care – PPO | Admitting: Gastroenterology

## 2013-10-18 ENCOUNTER — Other Ambulatory Visit: Payer: Self-pay | Admitting: Internal Medicine

## 2013-11-10 ENCOUNTER — Ambulatory Visit (AMBULATORY_SURGERY_CENTER): Payer: BC Managed Care – PPO | Admitting: *Deleted

## 2013-11-10 VITALS — Ht 75.0 in | Wt 289.4 lb

## 2013-11-10 DIAGNOSIS — Z86010 Personal history of colonic polyps: Secondary | ICD-10-CM

## 2013-11-10 MED ORDER — MOVIPREP 100 G PO SOLR: 1.0000 | Freq: Once | ORAL | Status: DC

## 2013-11-10 NOTE — Progress Notes (Signed)
No home 02 use. Pt does use cpap at night without 02. Pt denies problems with past sedation. ewm

## 2013-11-12 ENCOUNTER — Encounter: Payer: Self-pay | Admitting: Gastroenterology

## 2013-11-13 ENCOUNTER — Encounter: Payer: Self-pay | Admitting: Gastroenterology

## 2013-11-22 ENCOUNTER — Other Ambulatory Visit: Payer: Self-pay | Admitting: Internal Medicine

## 2013-11-25 ENCOUNTER — Encounter: Payer: BC Managed Care – PPO | Admitting: Internal Medicine

## 2013-11-26 ENCOUNTER — Other Ambulatory Visit: Payer: Self-pay | Admitting: *Deleted

## 2013-11-26 MED ORDER — LOSARTAN POTASSIUM 25 MG PO TABS: 25.0000 mg | ORAL_TABLET | Freq: Every day | ORAL | Status: DC

## 2013-12-03 ENCOUNTER — Ambulatory Visit (AMBULATORY_SURGERY_CENTER): Payer: BC Managed Care – PPO | Admitting: Gastroenterology

## 2013-12-03 ENCOUNTER — Encounter: Payer: Self-pay | Admitting: Gastroenterology

## 2013-12-03 VITALS — BP 112/76 | HR 47 | Temp 96.8°F | Resp 9 | Ht 75.0 in | Wt 289.0 lb

## 2013-12-03 DIAGNOSIS — D123 Benign neoplasm of transverse colon: Secondary | ICD-10-CM

## 2013-12-03 DIAGNOSIS — Z860100 Personal history of colon polyps, unspecified: Secondary | ICD-10-CM

## 2013-12-03 DIAGNOSIS — D126 Benign neoplasm of colon, unspecified: Secondary | ICD-10-CM

## 2013-12-03 MED ORDER — SODIUM CHLORIDE 0.9 % IV SOLN: 500.0000 mL | INTRAVENOUS | Status: DC

## 2013-12-03 NOTE — Progress Notes (Signed)
Called to room to assist during endoscopic procedure.  Patient ID and intended procedure confirmed with present staff. Received instructions for my participation in the procedure from the performing physician.  

## 2013-12-03 NOTE — Patient Instructions (Signed)
YOU HAD AN ENDOSCOPIC PROCEDURE TODAY AT THE [facility]: Refer to the procedure report that was given to you for any specific questions about what was found during the examination.  If the procedure report does not answer your questions, please call your gastroenterologist to clarify.  If you requested that your care partner not be given the details of your procedure findings, then the procedure report has been included in a sealed envelope for you to review at your convenience later.  YOU SHOULD EXPECT: Some feelings of bloating in the abdomen. Passage of more gas than usual.  Walking can help get rid of the air that was put into your GI tract during the procedure and reduce the bloating. If you had a lower endoscopy (such as a colonoscopy or flexible sigmoidoscopy) you may notice spotting of blood in your stool or on the toilet paper. If you underwent a bowel prep for your procedure, then you may not have a normal bowel movement for a few days.  DIET: Your first meal following the procedure should be a light meal and then it is ok to progress to your normal diet.  A half-sandwich or bowl of soup is an example of a good first meal.  Heavy or fried foods are harder to digest and may make you feel nauseous or bloated.  Likewise meals heavy in dairy and vegetables can cause extra gas to form and this can also increase the bloating.  Drink plenty of fluids but you should avoid alcoholic beverages for 24 hours.  ACTIVITY: Your care partner should take you home directly after the procedure.  You should plan to take it easy, moving slowly for the rest of the day.  You can resume normal activity the day after the procedure however you should NOT DRIVE or use heavy machinery for 24 hours (because of the sedation medicines used during the test).    SYMPTOMS TO REPORT IMMEDIATELY: A gastroenterologist can be reached at any hour.  During normal business hours, 8:30 AM to 5:00 PM Monday through Friday,  call [phone].  After hours and on weekends, please call the GI answering service at [phone] who will take a message and have the physician on call contact you.   Following lower endoscopy (colonoscopy or flexible sigmoidoscopy):  Excessive amounts of blood in the stool  Significant tenderness or worsening of abdominal pains  Swelling of the abdomen that is new, acute  Fever of 100F or higher  FOLLOW UP: If any biopsies were taken you will be contacted by phone or by letter within the next 1-3 weeks.  Call your gastroenterologist if you have not heard about the biopsies in 3 weeks.  Our staff will call the home number listed on your records the next business day following your procedure to check on you and address any questions or concerns that you may have at that time regarding the information given to you following your procedure. This is a courtesy call and so if there is no answer at the home number and we have not heard from you through the emergency physician on call, we will assume that you have returned to your regular daily activities without incident.  SIGNATURES/CONFIDENTIALITY: You and/or your care partner have signed paperwork which will be entered into your electronic medical record.  These signatures attest to the fact that that the information above on your After Visit Summary has been reviewed and is understood.  Full responsibility of the confidentiality of this   discharge information lies with you and/or your care-partner.  Polyp, hemorrhoid informtion given.  Pt. Wife mentioned that pt. May have syptoms of IBS- Dr. Fuller Plan advised him to try high fiber diet -copy given,  No aspirin, aspirin products or anti-inflammatory medication for two weeks.

## 2013-12-03 NOTE — Progress Notes (Signed)
Report to PACU, RN, vss, BBS= Clear.  

## 2013-12-03 NOTE — Op Note (Signed)
[place]  [address] [place], [id number]   COLONOSCOPY PROCEDURE REPORT  PATIENT: Anthony [name], Anthony [name]  MR#: [id number] BIRTHDATE: 11-Jul-1955 , 44  yrs. old GENDER: male ENDOSCOPIST: Ladene Artist, MD, [facility] PROCEDURE DATE:  12/03/2013 PROCEDURE:   Colonoscopy with snare polypectomy First Screening Colonoscopy - Avg.  risk and is 50 yrs.  old or older - No.  Prior Negative Screening - Now for repeat screening. N/A  History of Adenoma - Now for follow-up colonoscopy & has been > or = to 3 yrs.  Yes hx of adenoma.  Has been 3 or more years since last colonoscopy.  Polyps Removed Today? Yes. ASA CLASS:   Class II INDICATIONS:surveillance colonoscopy based on a history of adenomatous colonic polyp(s). MEDICATIONS: Monitored anesthesia care and Propofol 320 mg IV DESCRIPTION OF PROCEDURE:   After the risks benefits and alternatives of the procedure were thoroughly explained, informed consent was obtained.  The digital rectal exam revealed no abnormalities of the rectum.   The [id number]  endoscope was introduced through the anus and advanced to the cecum, which was identified by both the appendix and ileocecal valve. No adverse events experienced.   The quality of the prep was good, using MoviPrep  The instrument was then slowly withdrawn as the colon was fully examined.  COLON FINDINGS: A sessile polyp measuring 10 mm in size was found at the hepatic flexure.  A polypectomy was performed using snare cautery. Prior tattoo at hepatic flexure.  Polyp adjacent to the tattoo.  The colon mucosa was otherwise normal.  Retroflexed views revealed internal Grade I hemorrhoids. The time to cecum=3 minutes 38 seconds.  Withdrawal time=11 minutes 34 seconds.  The scope was withdrawn and the procedure completed.  COMPLICATIONS: There were no complications.   ENDOSCOPIC IMPRESSION: 1.   Sessile polyp was found at the hepatic flexure; polypectomy was performed using  snare cautery 2.   Prior tattoo at hepatic flexure. 3.   Small internal hemorrhoids  RECOMMENDATIONS: 1.  Hold aspirin, aspirin products, and anti-inflammatory medication for 2 weeks. 2.  Await pathology results 3.  Repeat Colonoscopy in 3 years.  eSigned:  Ladene Artist, MD, [facility] 12/03/2013 8:29 AM      PATIENT NAME:  Anthony [name], Anthony [name] MR#: [id number]

## 2013-12-04 ENCOUNTER — Telehealth: Payer: Self-pay | Admitting: *Deleted

## 2013-12-04 NOTE — Telephone Encounter (Signed)
  Follow up Call-  Call back number 12/03/2013  Post procedure Call Back phone  # [phone]  Permission to leave phone message Yes     Patient questions:  Do you have a fever, pain , or abdominal swelling? No. Pain Score  0 *  Have you tolerated food without any problems? yes  Have you been able to return to your normal activities? Yes.    Do you have any questions about your discharge instructions: Diet   No. Medications  No. Follow up visit  No.  Do you have questions or concerns about your Care? No.  Actions: * If pain score is 4 or above: No action needed, pain <4.

## 2013-12-09 ENCOUNTER — Encounter: Payer: Self-pay | Admitting: Gastroenterology

## 2013-12-24 ENCOUNTER — Other Ambulatory Visit: Payer: Self-pay

## 2013-12-24 MED ORDER — METFORMIN HCL 850 MG PO TABS: ORAL_TABLET | ORAL | Status: DC

## 2014-03-01 ENCOUNTER — Encounter: Payer: Self-pay | Admitting: Internal Medicine

## 2014-03-01 ENCOUNTER — Ambulatory Visit (INDEPENDENT_AMBULATORY_CARE_PROVIDER_SITE_OTHER): Payer: BC Managed Care – PPO

## 2014-03-01 ENCOUNTER — Ambulatory Visit (INDEPENDENT_AMBULATORY_CARE_PROVIDER_SITE_OTHER): Payer: BC Managed Care – PPO | Admitting: Internal Medicine

## 2014-03-01 VITALS — BP 119/76 | HR 57 | Temp 98.0°F | Ht 75.0 in | Wt 285.1 lb

## 2014-03-01 DIAGNOSIS — E119 Type 2 diabetes mellitus without complications: Secondary | ICD-10-CM

## 2014-03-01 DIAGNOSIS — I471 Supraventricular tachycardia: Secondary | ICD-10-CM

## 2014-03-01 DIAGNOSIS — Z0000 Encounter for general adult medical examination without abnormal findings: Secondary | ICD-10-CM

## 2014-03-01 DIAGNOSIS — Z23 Encounter for immunization: Secondary | ICD-10-CM

## 2014-03-01 LAB — HEMOGLOBIN A1C: Hgb A1c MFr Bld: 8 % — ABNORMAL HIGH (ref 4.6–6.5)

## 2014-03-01 LAB — BASIC METABOLIC PANEL
BUN: 17 mg/dL (ref 6–23)
CHLORIDE: 99 meq/L (ref 96–112)
CO2: 28 mEq/L (ref 19–32)
Calcium: 8.9 mg/dL (ref 8.4–10.5)
Creatinine, Ser: 0.8 mg/dL (ref 0.4–1.5)
GFR: 100.3 mL/min (ref >60.00)
Glucose, Bld: 122 mg/dL — ABNORMAL HIGH (ref 70–99)
POTASSIUM: 3.7 meq/L (ref 3.5–5.1)
Sodium: 136 mEq/L (ref 135–145)

## 2014-03-01 LAB — CBC WITH DIFFERENTIAL/PLATELET
BASOS PCT: 0.5 % (ref 0.0–3.0)
Basophils Absolute: 0 10*3/uL (ref 0.0–0.1)
EOS PCT: 1.9 % (ref 0.0–5.0)
Eosinophils Absolute: 0.1 10*3/uL (ref 0.0–0.7)
HEMATOCRIT: 40.5 % (ref 39.0–52.0)
Hemoglobin: 13.4 g/dL (ref 13.0–17.0)
LYMPHS ABS: 1.6 10*3/uL (ref 0.7–4.0)
Lymphocytes Relative: 22.7 % (ref 12.0–46.0)
MCHC: 33.1 g/dL (ref 30.0–36.0)
MCV: 92.5 fl (ref 78.0–100.0)
Monocytes Absolute: 0.7 10*3/uL (ref 0.1–1.0)
Monocytes Relative: 10.1 % (ref 3.0–12.0)
Neutro Abs: 4.5 10*3/uL (ref 1.4–7.7)
Neutrophils Relative %: 64.8 % (ref 43.0–77.0)
PLATELETS: 271 10*3/uL (ref 150.0–400.0)
RBC: 4.37 Mil/uL (ref 4.22–5.81)
RDW: 12.3 % (ref 11.5–15.5)
WBC: 6.9 10*3/uL (ref 4.0–10.5)

## 2014-03-01 MED ORDER — HYDROCHLOROTHIAZIDE 25 MG PO TABS: 25.0000 mg | ORAL_TABLET | Freq: Every day | ORAL | Status: DC

## 2014-03-01 MED ORDER — ATENOLOL 50 MG PO TABS: 50.0000 mg | ORAL_TABLET | Freq: Every day | ORAL | Status: DC

## 2014-03-01 MED ORDER — LOSARTAN POTASSIUM 25 MG PO TABS: 25.0000 mg | ORAL_TABLET | Freq: Every day | ORAL | Status: DC

## 2014-03-01 MED ORDER — METFORMIN HCL 850 MG PO TABS: 850.0000 mg | ORAL_TABLET | Freq: Two times a day (BID) | ORAL | Status: DC

## 2014-03-01 MED ORDER — PIOGLITAZONE HCL 45 MG PO TABS: 45.0000 mg | ORAL_TABLET | Freq: Every day | ORAL | Status: DC

## 2014-03-01 MED ORDER — ATORVASTATIN CALCIUM 40 MG PO TABS: ORAL_TABLET | ORAL | Status: DC

## 2014-03-01 NOTE — Progress Notes (Signed)
Subjective:    Patient ID: Anthony [name], male    DOB: September 08, 1955, 58 y.o.   MRN: [id number]  DOS:  03/01/2014 Type of visit - description : cpx Interval history: In general feels well. Occasional has palpitations, about 3 episodes a month, they may last few hours or all day. Occasionally it is associated with shortness of breath, no syncope or chest pain. Sometimes when he stands up quickly he gets dizzy.   ROS Denies chest pain or difficulty breathing. No lower extremity edema. No nausea, vomiting, blood in the stools. No history of on and off diarrhea. No anxiety depression No dysuria, gross hematuria difficulty urinating  Past Medical History  Diagnosis Date  . Diabetes mellitus     type II  . Hypertension   . Hyperlipidemia   . GERD (gastroesophageal reflux disease)   . DJD (degenerative joint disease)     per MRI of LS spine 2005  . Chronic back pain   . Hypogonadism male     dx w/ hypogonadism elsewhere ~ 2011  . Normal cardiac stress test     (-) stress test 2003, 2006  . S/P cardiac cath      04-2006 neg per pt--CP improved w/ PPIs  . Aortic root enlargement     CT 42 mm 2014  . Tachycardia     ER 07-2012, ?SCR, on atenolol  . Sleep apnea     uses cpap    Past Surgical History  Procedure Laterality Date  . Cystectomy      from back  . Cardiac catheterization    . Colonoscopy    . Polypectomy      History   Social History  . Marital Status: Married    Spouse Name: N/A    Number of Children: 2  . Years of Education: N/A   Occupational History  . TRUCK DRIVER    Social History Main Topics  . Smoking status: Former Smoker    Types: Cigars    Quit date: 03/12/1981  . Smokeless tobacco: Never Used     Comment: smoked 1-2 cigers per week x 1 yr.    . Alcohol Use: Yes     Comment: occassinally  . Drug Use: No  . Sexual Activity: Not on file   Other Topics Concern  . Not on file   Social History Narrative   Truck driver (local)          Medication List       This list is accurate as of: 03/01/14  6:16 PM.  Always use your most recent med list.               aspirin 81 MG tablet  Take 81 mg by mouth daily.     atenolol 50 MG tablet  Commonly known as:  TENORMIN  Take 1 tablet (50 mg total) by mouth daily.     atorvastatin 40 MG tablet  Commonly known as:  LIPITOR  TAKE 1 TABLET (40 MG TOTAL) BY MOUTH DAILY.     glucose blood test strip  Commonly known as:  ONE TOUCH ULTRA TEST  Use as instructed     hydrochlorothiazide 25 MG tablet  Commonly known as:  HYDRODIURIL  Take 1 tablet (25 mg total) by mouth daily.     losartan 25 MG tablet  Commonly known as:  COZAAR  Take 1 tablet (25 mg total) by mouth daily.     metFORMIN 850 MG tablet  Commonly known  as:  GLUCOPHAGE  Take 1 tablet (850 mg total) by mouth 2 (two) times daily with a meal.     pioglitazone 45 MG tablet  Commonly known as:  ACTOS  Take 1 tablet (45 mg total) by mouth daily.           Objective:   Physical Exam BP 119/76 mmHg  Pulse 57  Temp(Src) 98 F (36.7 C) (Oral)  Ht 6\' 3"  (1.905 m)  Wt 285 lb 2 oz (129.332 kg)  BMI 35.64 kg/m2  SpO2 96% General -- alert, well-developed, NAD.  Neck --no thyromegaly , normal carotid pulse  HEENT-- Not pale.   Lungs -- normal respiratory effort, no intercostal retractions, no accessory muscle use, and normal breath sounds.  Heart-- normal rate, regular rhythm, no murmur.  Abdomen-- Not distended, good bowel sounds,soft, non-tender.  Rectal-- declined  Extremities-- trace  pretibial edema bilaterally  Neurologic--  alert & oriented X3. Speech normal, gait appropriate for age, strength symmetric and appropriate for age.  Psych-- Cognition and judgment appear intact. Cooperative with normal attention span and concentration. No anxious or depressed appearing.      Assessment & Plan:

## 2014-03-01 NOTE — Progress Notes (Signed)
Pre visit review using our clinic review tool, if applicable. No additional management support is needed unless otherwise documented below in the visit note. 

## 2014-03-01 NOTE — Patient Instructions (Signed)
Get your blood work before you leave   Exercise 3 hours a week  Try to eat healthy  MYFITNESSPAL? All about diabetes, great resource!  [email or web address].za.[email or web address]  Call if the palpitations get worse, more frequent or intense or if you have chest pain  ER if symptoms severe  Please come back to the office 3  for a routine check up

## 2014-03-01 NOTE — Assessment & Plan Note (Addendum)
Was seen several months ago by cardiology, felt to be stable however for the last few months he is having symptoms again. No syncope or chest pain. He is on beta blockers, pulse in the 50s. Symptoms are approximately 3 times a month consequently I don't think a Holter will help [place], we can't increase the beta blockers dose much . Plan: See cardiology as recommended 05-2014, pt to make an appointment. If symptoms increase, more intense or frequent either call us or cardiology or go to the ER. Patient in agreement

## 2014-03-01 NOTE — Assessment & Plan Note (Addendum)
Based on the last A1c, he was prescribed Onglyza, the patient did not like to take, he started to take metformin 850 mg regularly twice a day. CBGs range from 100-180. Plan:  Check A1c, continue metformin 850 twice a day, Actos 45 mg, further advice good results Diet and exercise encouraged

## 2014-03-01 NOTE — Assessment & Plan Note (Signed)
Td  10-2008 zostavax 2014 rec flu shot  Prostate cancer screening, declined DRE today, PSAs stable, reassess next year Had 3 Cscope , last  11-2013, next 3 years diet  and exercise -- discussed

## 2014-03-02 MED ORDER — CANAGLIFLOZIN 100 MG PO TABS: 100.0000 mg | ORAL_TABLET | Freq: Every day | ORAL | Status: DC

## 2014-03-02 NOTE — Addendum Note (Signed)
Addended by: Wilfrid Lund on: 03/02/2014 02:40 PM   Modules accepted: Orders

## 2014-03-08 ENCOUNTER — Other Ambulatory Visit: Payer: Self-pay | Admitting: Internal Medicine

## 2014-06-08 ENCOUNTER — Encounter: Payer: Self-pay | Admitting: Internal Medicine

## 2014-06-08 ENCOUNTER — Ambulatory Visit (INDEPENDENT_AMBULATORY_CARE_PROVIDER_SITE_OTHER): Payer: BLUE CROSS/BLUE SHIELD | Admitting: Internal Medicine

## 2014-06-08 VITALS — BP 122/74 | HR 65 | Temp 98.0°F | Ht 75.0 in | Wt 265.4 lb

## 2014-06-08 DIAGNOSIS — I10 Essential (primary) hypertension: Secondary | ICD-10-CM

## 2014-06-08 DIAGNOSIS — E119 Type 2 diabetes mellitus without complications: Secondary | ICD-10-CM | POA: Diagnosis not present

## 2014-06-08 DIAGNOSIS — E785 Hyperlipidemia, unspecified: Secondary | ICD-10-CM

## 2014-06-08 DIAGNOSIS — I471 Supraventricular tachycardia: Secondary | ICD-10-CM | POA: Diagnosis not present

## 2014-06-08 LAB — BASIC METABOLIC PANEL
BUN: 26 mg/dL — ABNORMAL HIGH (ref 6–23)
CO2: 29 meq/L (ref 19–32)
CREATININE: 1.02 mg/dL (ref 0.40–1.50)
Calcium: 9.7 mg/dL (ref 8.4–10.5)
Chloride: 100 mEq/L (ref 96–112)
GFR: 80.09 mL/min (ref >60.00)
GLUCOSE: 132 mg/dL — AB (ref 70–99)
Potassium: 3.8 mEq/L (ref 3.5–5.1)
Sodium: 136 mEq/L (ref 135–145)

## 2014-06-08 LAB — HEMOGLOBIN A1C: Hgb A1c MFr Bld: 7.1 % — ABNORMAL HIGH (ref 4.6–6.5)

## 2014-06-08 MED ORDER — ATENOLOL 50 MG PO TABS: 50.0000 mg | ORAL_TABLET | Freq: Every day | ORAL | Status: DC

## 2014-06-08 MED ORDER — CANAGLIFLOZIN 100 MG PO TABS: 100.0000 mg | ORAL_TABLET | Freq: Every day | ORAL | Status: DC

## 2014-06-08 MED ORDER — PIOGLITAZONE HCL 45 MG PO TABS: 45.0000 mg | ORAL_TABLET | Freq: Every day | ORAL | Status: DC

## 2014-06-08 MED ORDER — LOSARTAN POTASSIUM 25 MG PO TABS: 25.0000 mg | ORAL_TABLET | Freq: Every day | ORAL | Status: DC

## 2014-06-08 MED ORDER — HYDROCHLOROTHIAZIDE 25 MG PO TABS: 25.0000 mg | ORAL_TABLET | Freq: Every day | ORAL | Status: DC

## 2014-06-08 MED ORDER — METFORMIN HCL 850 MG PO TABS: 850.0000 mg | ORAL_TABLET | Freq: Two times a day (BID) | ORAL | Status: DC

## 2014-06-08 NOTE — Progress Notes (Signed)
Subjective:    Patient ID: Anthony [name], male    DOB: 08/15/1955, 59 y.o.   MRN: [id number]  DOS:  06/08/2014 Type of visit - description : rov Interval history: DM--good compliance with medication, ambulatory CBGs usually in the low 100s, one time   was 78. High cholesterol--on Lipitor, good compliance, last FLP reviewed and very good HTN-- good compliance with medication, ambulatory BPs around 117/70. Tachycardia-- has not seen cardiology yet but is essentially asymptomatic   Review of Systems Denies chest pain or difficulty breathing No nausea, vomiting, diarrhea No dysuria or genital rash  Past Medical History  Diagnosis Date  . Diabetes mellitus     type II  . Hypertension   . Hyperlipidemia   . GERD (gastroesophageal reflux disease)   . DJD (degenerative joint disease)     per MRI of LS spine 2005  . Chronic back pain   . Hypogonadism male     dx w/ hypogonadism elsewhere ~ 2011  . Normal cardiac stress test     (-) stress test 2003, 2006  . S/P cardiac cath      04-2006 neg per pt--CP improved w/ PPIs  . Aortic root enlargement     CT 42 mm 2014  . Tachycardia     ER 07-2012, ?SCR, on atenolol  . Sleep apnea     uses cpap    Past Surgical History  Procedure Laterality Date  . Cystectomy      from back  . Cardiac catheterization    . Colonoscopy    . Polypectomy      History   Social History  . Marital Status: Married    Spouse Name: N/A  . Number of Children: 2  . Years of Education: N/A   Occupational History  . TRUCK DRIVER    Social History Main Topics  . Smoking status: Former Smoker    Types: Cigars    Quit date: 03/12/1981  . Smokeless tobacco: Never Used     Comment: smoked 1-2 cigers per week x 1 yr.    . Alcohol Use: Yes     Comment: occassinally  . Drug Use: No  . Sexual Activity: Not on file   Other Topics Concern  . Not on file   Social History Narrative   Truck driver (local)        Medication List       This list  is accurate as of: 06/08/14 12:48 PM.  Always use your most recent med list.               aspirin 81 MG tablet  Take 81 mg by mouth daily.     atenolol 50 MG tablet  Commonly known as:  TENORMIN  Take 1 tablet (50 mg total) by mouth daily.     atorvastatin 40 MG tablet  Commonly known as:  LIPITOR  TAKE 1 TABLET (40 MG TOTAL) BY MOUTH DAILY.     canagliflozin 100 MG Tabs tablet  Commonly known as:  INVOKANA  Take 1 tablet (100 mg total) by mouth daily.     hydrochlorothiazide 25 MG tablet  Commonly known as:  HYDRODIURIL  Take 1 tablet (25 mg total) by mouth daily.     losartan 25 MG tablet  Commonly known as:  COZAAR  Take 1 tablet (25 mg total) by mouth daily.     metFORMIN 850 MG tablet  Commonly known as:  GLUCOPHAGE  Take 1 tablet (850 mg total)  by mouth 2 (two) times daily with a meal.     ONE TOUCH ULTRA TEST test strip  Generic drug:  glucose blood  USE AS INSTRUCTED     pioglitazone 45 MG tablet  Commonly known as:  ACTOS  Take 1 tablet (45 mg total) by mouth daily.           Objective:   Physical Exam BP 122/74 mmHg  Pulse 65  Temp(Src) 98 F (36.7 C) (Oral)  Ht 6\' 3"  (1.905 m)  Wt 265 lb 6 oz (120.373 kg)  BMI 33.17 kg/m2  SpO2 98% General:   Well developed, well nourished . NAD.  HEENT:  Normocephalic . Face symmetric, atraumatic Lungs:  CTA B Normal respiratory effort, no intercostal retractions, no accessory muscle use. Heart: RRR,  no murmur.  Diabetic foot exam: Normal pulses, skin normal, pinprick examination normal Neurologic:  alert & oriented X3.  Speech normal, gait appropriate for age and unassisted Psych--  Cognition and judgment appear intact.  Cooperative with normal attention span and concentration.  Behavior appropriate. No anxious or depressed appearing.        Assessment & Plan:

## 2014-06-08 NOTE — Assessment & Plan Note (Signed)
Compliance with medications is very good. Foot exam today is negative. Diet and exercise much improved, the patient has lost weight since last visit. Discussed risks and avoidance of hypoglycemia.  The patient denies any side effects from induction of Invokana treatment.   Plan: Will check A1c today, continue current regimen of medications. Follow up in 4 months.

## 2014-06-08 NOTE — Assessment & Plan Note (Addendum)
Currently on beta blockers , plans to see cardiology.  Has not had issues with medication. Plan: Follow up with cardiology Continue beta blockers

## 2014-06-08 NOTE — Progress Notes (Signed)
Subjective:    Patient ID: Anthony [name], male    DOB: [birth year], 59 y.o.   MRN: [id number]  DOS:  06/08/2014 Type of visit - description : rov Interval history: DM -- Diet and exercise improved, checking A1c HTN -- Well controlled on current regimen Chol -- Diet and exercise improved, taking Lipitor every other day instead of every day. No side effects from medication. Tachycardia -- Patient currently on beta blocker with plans to follow up with cardiology.   Review of Systems   Past Medical History  Diagnosis Date  . Diabetes mellitus     type II  . Hypertension   . Hyperlipidemia   . GERD (gastroesophageal reflux disease)   . DJD (degenerative joint disease)     per MRI of LS spine 2005  . Chronic back pain   . Hypogonadism male     dx w/ hypogonadism elsewhere ~ 2011  . Normal cardiac stress test     (-) stress test 2003, 2006  . S/P cardiac cath      04-2006 neg per pt--CP improved w/ PPIs  . Aortic root enlargement     CT 42 mm 2014  . Tachycardia     ER 07-2012, ?SCR, on atenolol  . Sleep apnea     uses cpap    Past Surgical History  Procedure Laterality Date  . Cystectomy      from back  . Cardiac catheterization    . Colonoscopy    . Polypectomy      History   Social History  . Marital Status: Married    Spouse Name: N/A  . Number of Children: 2  . Years of Education: N/A   Occupational History  . TRUCK DRIVER    Social History Main Topics  . Smoking status: Former Smoker    Types: Cigars    Quit date: 03/12/1981  . Smokeless tobacco: Never Used     Comment: smoked 1-2 cigers per week x 1 yr.    . Alcohol Use: Yes     Comment: occassinally  . Drug Use: No  . Sexual Activity: Not on file   Other Topics Concern  . Not on file   Social History Narrative   Truck driver (local)        Medication List       This list is accurate as of: 06/08/14  8:35 AM.  Always use your most recent med list.               aspirin 81 MG tablet    Take 81 mg by mouth daily.     atenolol 50 MG tablet  Commonly known as:  TENORMIN  Take 1 tablet (50 mg total) by mouth daily.     atorvastatin 40 MG tablet  Commonly known as:  LIPITOR  TAKE 1 TABLET (40 MG TOTAL) BY MOUTH DAILY.     canagliflozin 100 MG Tabs tablet  Commonly known as:  INVOKANA  Take 1 tablet (100 mg total) by mouth daily.     hydrochlorothiazide 25 MG tablet  Commonly known as:  HYDRODIURIL  Take 1 tablet (25 mg total) by mouth daily.     losartan 25 MG tablet  Commonly known as:  COZAAR  Take 1 tablet (25 mg total) by mouth daily.     metFORMIN 850 MG tablet  Commonly known as:  GLUCOPHAGE  Take 1 tablet (850 mg total) by mouth 2 (two) times daily with a meal.  ONE TOUCH ULTRA TEST test strip  Generic drug:  glucose blood  USE AS INSTRUCTED     pioglitazone 45 MG tablet  Commonly known as:  ACTOS  Take 1 tablet (45 mg total) by mouth daily.           Objective:   Physical Exam BP 122/74 mmHg  Pulse 65  Temp(Src) 98 F (36.7 C) (Oral)  Ht 6\' 3"  (1.905 m)  Wt 265 lb 6 oz (120.373 kg)  BMI 33.17 kg/m2  SpO2 98%  General:   Well developed, well nourished . NAD.  HEENT:  Normocephalic . Face symmetric, atraumatic Lungs:  CTA B Normal respiratory effort, no intercostal retractions, no accessory muscle use. Heart: RRR,  no murmur.  Muscle skeletal: Diabetic foot exam negative. Normal pulses, normal skin, normal sensation. no pretibial edema bilaterally  Skin: Not pale. Not jaundice Neurologic:  alert & oriented X3.  Speech normal, gait appropriate for age and unassisted Psych--  Cognition and judgment appear intact.  Cooperative with normal attention span and concentration.  Behavior appropriate. No anxious or depressed appearing.        Assessment & Plan:

## 2014-06-08 NOTE — Progress Notes (Signed)
Pre visit review using our clinic review tool, if applicable. No additional management support is needed unless otherwise documented below in the visit note. 

## 2014-06-08 NOTE — Assessment & Plan Note (Addendum)
Well controlled today Lifestyle improved which has contributed to better BP. Plan: BMP to check potassium level Continue current losartan, BB, HCTZ

## 2014-06-08 NOTE — Patient Instructions (Addendum)
Get your blood work before you leave  See the eye doctor regularly  Come back to the office in 4 months   for a routine check up  Please schedule an appointment at the front desk    Come back fasting

## 2014-06-08 NOTE — Assessment & Plan Note (Signed)
Patient is taking Lipitor every other day, this is working for him. Diet and exercise are improved. No myalgias or side effects.  Plan: Continue current medication regimen

## 2014-06-18 ENCOUNTER — Telehealth: Payer: Self-pay | Admitting: Internal Medicine

## 2014-06-18 NOTE — Telephone Encounter (Signed)
Error/gd °

## 2014-06-27 ENCOUNTER — Other Ambulatory Visit: Payer: Self-pay | Admitting: Internal Medicine

## 2014-09-06 ENCOUNTER — Other Ambulatory Visit: Payer: Self-pay

## 2014-09-20 ENCOUNTER — Telehealth: Payer: Self-pay | Admitting: *Deleted

## 2014-09-20 DIAGNOSIS — I7789 Other specified disorders of arteries and arterioles: Secondary | ICD-10-CM

## 2014-09-20 NOTE — Telephone Encounter (Signed)
LEFT MESSAGE FOR  PT  TO CALL BACK  IS  DUE  FOR  CTA   F/U ON AORTA  DILITATION .Adonis Housekeeper

## 2014-09-22 NOTE — Telephone Encounter (Signed)
PT AWARE SCHEDULER  TO CALL  WITH   A CARDIAC  CT  APPT  ./CY

## 2014-09-22 NOTE — Telephone Encounter (Signed)
Follow up ° ° ° ° ° °Returning Christine's call °

## 2014-09-22 NOTE — Telephone Encounter (Signed)
Follow Up  Pt returning Christine's phone call.  

## 2014-09-24 ENCOUNTER — Encounter: Payer: Self-pay | Admitting: Cardiovascular Disease

## 2014-10-05 ENCOUNTER — Ambulatory Visit (HOSPITAL_COMMUNITY): Payer: BLUE CROSS/BLUE SHIELD

## 2014-10-08 ENCOUNTER — Ambulatory Visit: Payer: BLUE CROSS/BLUE SHIELD | Admitting: Internal Medicine

## 2014-11-01 ENCOUNTER — Ambulatory Visit (HOSPITAL_COMMUNITY)
Admission: RE | Admit: 2014-11-01 | Discharge: 2014-11-01 | Disposition: A | Discharge status: Home / Self Care | Payer: BLUE CROSS/BLUE SHIELD | Source: Ambulatory Visit | Attending: Cardiovascular Disease | Admitting: Cardiovascular Disease

## 2014-11-01 DIAGNOSIS — I77810 Thoracic aortic ectasia: Secondary | ICD-10-CM | POA: Insufficient documentation

## 2014-11-01 DIAGNOSIS — I7789 Other specified disorders of arteries and arterioles: Secondary | ICD-10-CM

## 2014-11-01 DIAGNOSIS — I77819 Aortic ectasia, unspecified site: Secondary | ICD-10-CM | POA: Diagnosis not present

## 2014-11-01 LAB — HM DIABETES EYE EXAM

## 2014-11-01 MED ORDER — IOHEXOL 350 MG/ML SOLN
100.0000 mL | Freq: Once | INTRAVENOUS | Status: AC | PRN
  Administered 2014-11-01: 100 mL via INTRAVENOUS

## 2014-11-01 MED ORDER — NITROGLYCERIN 0.4 MG SL SUBL
0.4000 mg | SUBLINGUAL_TABLET | SUBLINGUAL | Status: DC | PRN
  Filled 2014-11-01: qty 25

## 2014-11-01 MED ORDER — NITROGLYCERIN 0.4 MG SL SUBL
SUBLINGUAL_TABLET | SUBLINGUAL | Status: AC
  Administered 2014-11-01: 0.4 mg via ORAL
  Filled 2014-11-01: qty 1

## 2014-11-29 ENCOUNTER — Other Ambulatory Visit: Payer: Self-pay | Admitting: Internal Medicine

## 2014-12-13 ENCOUNTER — Telehealth: Payer: Self-pay | Admitting: Pulmonary Disease

## 2014-12-13 NOTE — Telephone Encounter (Signed)
Err

## 2014-12-16 ENCOUNTER — Other Ambulatory Visit: Payer: Self-pay

## 2014-12-20 ENCOUNTER — Encounter: Payer: Self-pay | Admitting: Internal Medicine

## 2014-12-20 ENCOUNTER — Ambulatory Visit (INDEPENDENT_AMBULATORY_CARE_PROVIDER_SITE_OTHER): Payer: BLUE CROSS/BLUE SHIELD | Admitting: Internal Medicine

## 2014-12-20 VITALS — BP 118/72 | HR 58 | Temp 97.9°F | Ht 75.0 in | Wt 278.0 lb

## 2014-12-20 DIAGNOSIS — E119 Type 2 diabetes mellitus without complications: Secondary | ICD-10-CM | POA: Diagnosis not present

## 2014-12-20 DIAGNOSIS — G4733 Obstructive sleep apnea (adult) (pediatric): Secondary | ICD-10-CM

## 2014-12-20 DIAGNOSIS — Z125 Encounter for screening for malignant neoplasm of prostate: Secondary | ICD-10-CM

## 2014-12-20 DIAGNOSIS — Z23 Encounter for immunization: Secondary | ICD-10-CM | POA: Diagnosis not present

## 2014-12-20 DIAGNOSIS — I10 Essential (primary) hypertension: Secondary | ICD-10-CM

## 2014-12-20 DIAGNOSIS — E785 Hyperlipidemia, unspecified: Secondary | ICD-10-CM

## 2014-12-20 DIAGNOSIS — Z09 Encounter for follow-up examination after completed treatment for conditions other than malignant neoplasm: Secondary | ICD-10-CM | POA: Insufficient documentation

## 2014-12-20 DIAGNOSIS — Z1159 Encounter for screening for other viral diseases: Secondary | ICD-10-CM

## 2014-12-20 DIAGNOSIS — Z114 Encounter for screening for human immunodeficiency virus [HIV]: Secondary | ICD-10-CM

## 2014-12-20 LAB — HM DIABETES FOOT EXAM: HM DIABETIC FOOT EXAM: NORMAL

## 2014-12-20 NOTE — Progress Notes (Signed)
Subjective:    Patient ID: Anthony [name], male    DOB: 1955-12-09, 59 y.o.   MRN: [id number]  DOS:  12/20/2014 Type of visit - description : rov Interval history: Diabetes: Good compliance w/ medication, ambulatory blood sugars in the low 100s, very rarely go to 150 or 160. HTN: Blood pressures at home consistently 120/80 Brother was diagnosed with prostate cancer, likes a PSA checked. High cholesterol, on low dose of Lipitor, due for labs    Review of Systems  Denies chest pain or difficulty breathing No nausea, vomiting, diarrhea. Recently had his eyes checked: Normal No lower extremity paresthesias  Past Medical History  Diagnosis Date  . Diabetes mellitus     type II  . Hypertension   . Hyperlipidemia   . GERD (gastroesophageal reflux disease)   . DJD (degenerative joint disease)     per MRI of LS spine 2005  . Chronic back pain   . Hypogonadism male     dx w/ hypogonadism elsewhere ~ 2011  . Normal cardiac stress test     (-) stress test 2003, 2006  . S/P cardiac cath      04-2006 neg per pt--CP improved w/ PPIs  . Aortic root enlargement (HCC)     CT 42 mm 2014  . Tachycardia     ER 07-2012, ?SCR, on atenolol  . Sleep apnea     uses cpap    Past Surgical History  Procedure Laterality Date  . Cystectomy      from back  . Cardiac catheterization    . Colonoscopy    . Polypectomy      Social History   Social History  . Marital Status: Married    Spouse Name: N/A  . Number of Children: 2  . Years of Education: N/A   Occupational History  . TRUCK DRIVER    Social History Main Topics  . Smoking status: Former Smoker    Types: Cigars    Quit date: 03/12/1981  . Smokeless tobacco: Never Used     Comment: smoked 1-2 cigers per week x 1 yr.    . Alcohol Use: Yes     Comment: occassinally  . Drug Use: No  . Sexual Activity: Not on file   Other Topics Concern  . Not on file   Social History Narrative   Truck driver (local)        Medication  List       This list is accurate as of: 12/20/14  2:36 PM.  Always use your most recent med list.               aspirin 81 MG tablet  Take 81 mg by mouth daily.     atenolol 50 MG tablet  Commonly known as:  TENORMIN  Take 1 tablet (50 mg total) by mouth daily.     atorvastatin 40 MG tablet  Commonly known as:  LIPITOR  TAKE 1 TABLET (40 MG TOTAL) BY MOUTH DAILY.     canagliflozin 100 MG Tabs tablet  Commonly known as:  INVOKANA  Take 1 tablet (100 mg total) by mouth daily.     hydrochlorothiazide 25 MG tablet  Commonly known as:  HYDRODIURIL  Take 1 tablet (25 mg total) by mouth daily.     losartan 25 MG tablet  Commonly known as:  COZAAR  Take 1 tablet (25 mg total) by mouth daily.     metFORMIN 850 MG tablet  Commonly known as:  GLUCOPHAGE  Take 1 tablet (850 mg total) by mouth 2 (two) times daily with a meal.     ONE TOUCH ULTRA TEST test strip  Generic drug:  glucose blood  USE AS INSTRUCTED     glucose blood test strip  Commonly known as:  ONE TOUCH ULTRA TEST  Check blood sugar no more than twice daily.     pioglitazone 45 MG tablet  Commonly known as:  ACTOS  Take 1 tablet (45 mg total) by mouth daily.           Objective:   Physical Exam BP 118/72 mmHg  Pulse 58  Temp(Src) 97.9 F (36.6 C) (Oral)  Ht 6\' 3"  (1.905 m)  Wt 278 lb (126.1 kg)  BMI 34.75 kg/m2  SpO2 96% General:   Well developed, well nourished . NAD.  HEENT:  Normocephalic . Face symmetric, atraumatic Lungs:  CTA B Normal respiratory effort, no intercostal retractions, no accessory muscle use. Heart: RRR,  no murmur.  No pretibial edema bilaterally  Diabetic foot exam: Normal pedal pulses, skin normal, pinprick examination Neurologic:  alert & oriented X3.  Speech normal, gait appropriate for age and unassisted Psych--  Cognition and judgment appear intact.  Cooperative with normal attention span and concentration.  Behavior appropriate. No anxious or depressed  appearing.      Assessment & Plan:   Assessment > DM HTN Hyperlipidemia CV Tachycardia : ER eval 2014,   rx atenolol Stress test 2003, 2006 Chest pain-2008,  (-) cardiac cath, sx decrease with PPIs Aorta enlarment per CT  2014 OSA - on Cpap GERD DJD, Chronic back pain, MRI 2005 showed OA Hypogonadism DX 2011 + FH Prostate ca brother age 90  Plan DM: Continue w/ current medications, check A1c. Feet exam negative today. HTN: Well control, check a BMP Hyperlipidemia: On Lipitor 40 mg half tablet every other day, due for labs. + Family history of prostate cancer: He is asymptomatic, request a PSA, will do. Sleep apnea: Needs to get established with a new sleep medicine specialist, referral done. RTC 4 months for a physical

## 2014-12-20 NOTE — Progress Notes (Signed)
Pre visit review using our clinic review tool, if applicable. No additional management support is needed unless otherwise documented below in the visit note. 

## 2014-12-20 NOTE — Assessment & Plan Note (Signed)
DM: Continue w/ current medications, check A1c. Feet exam negative today. HTN: Well control, check a BMP Hyperlipidemia: On Lipitor 40 mg half tablet every other day, due for labs. + Family history of prostate cancer: He is asymptomatic, request a PSA, will do. Sleep apnea: Needs to get established with a new sleep medicine specialist, referral done. RTC 4 months for a physical

## 2014-12-20 NOTE — Patient Instructions (Signed)
Get your blood work before you leave     Next visit  for a physical exam in 4 months, no need to come back fasting     Please schedule an appointment at the front desk

## 2014-12-21 LAB — LIPID PANEL
CHOL/HDL RATIO: 3
Cholesterol: 146 mg/dL (ref 0–200)
HDL: 47.6 mg/dL (ref >39.00)
LDL CALC: 76 mg/dL (ref 0–99)
NonHDL: 98.8
TRIGLYCERIDES: 116 mg/dL (ref 0.0–149.0)
VLDL: 23.2 mg/dL (ref 0.0–40.0)

## 2014-12-21 LAB — BASIC METABOLIC PANEL
BUN: 18 mg/dL (ref 6–23)
CHLORIDE: 101 meq/L (ref 96–112)
CO2: 28 meq/L (ref 19–32)
Calcium: 9.4 mg/dL (ref 8.4–10.5)
Creatinine, Ser: 0.89 mg/dL (ref 0.40–1.50)
GFR: 93.55 mL/min (ref >60.00)
Glucose, Bld: 109 mg/dL — ABNORMAL HIGH (ref 70–99)
POTASSIUM: 4.1 meq/L (ref 3.5–5.1)
SODIUM: 140 meq/L (ref 135–145)

## 2014-12-21 LAB — HIV ANTIBODY (ROUTINE TESTING W REFLEX): HIV 1&2 Ab, 4th Generation: NONREACTIVE

## 2014-12-21 LAB — HEMOGLOBIN A1C: Hgb A1c MFr Bld: 7.1 % — ABNORMAL HIGH (ref 4.6–6.5)

## 2014-12-21 LAB — PSA: PSA: 0.76 ng/mL (ref 0.10–4.00)

## 2014-12-21 LAB — HEPATITIS C ANTIBODY: HCV Ab: NEGATIVE

## 2014-12-22 MED ORDER — CANAGLIFLOZIN 300 MG PO TABS: 300.0000 mg | ORAL_TABLET | Freq: Every day | ORAL | Status: DC

## 2014-12-22 NOTE — Addendum Note (Signed)
Addended by: Wilfrid Lund on: 12/22/2014 10:31 AM   Modules accepted: Orders, Medications

## 2015-01-03 ENCOUNTER — Ambulatory Visit: Payer: BLUE CROSS/BLUE SHIELD | Admitting: Adult Health

## 2015-01-14 ENCOUNTER — Ambulatory Visit: Payer: BLUE CROSS/BLUE SHIELD | Admitting: Cardiovascular Disease

## 2015-01-24 ENCOUNTER — Encounter: Payer: Self-pay | Admitting: Adult Health

## 2015-01-24 ENCOUNTER — Encounter: Payer: Self-pay | Admitting: Cardiology

## 2015-01-24 ENCOUNTER — Ambulatory Visit (INDEPENDENT_AMBULATORY_CARE_PROVIDER_SITE_OTHER): Payer: BLUE CROSS/BLUE SHIELD | Admitting: Adult Health

## 2015-01-24 ENCOUNTER — Ambulatory Visit (INDEPENDENT_AMBULATORY_CARE_PROVIDER_SITE_OTHER): Payer: BLUE CROSS/BLUE SHIELD | Admitting: Cardiology

## 2015-01-24 VITALS — BP 110/76 | HR 61 | Temp 98.3°F | Ht 75.0 in | Wt 273.0 lb

## 2015-01-24 VITALS — BP 102/66 | HR 56 | Ht 75.0 in | Wt 273.1 lb

## 2015-01-24 DIAGNOSIS — I10 Essential (primary) hypertension: Secondary | ICD-10-CM | POA: Diagnosis not present

## 2015-01-24 DIAGNOSIS — I471 Supraventricular tachycardia: Secondary | ICD-10-CM

## 2015-01-24 DIAGNOSIS — G4733 Obstructive sleep apnea (adult) (pediatric): Secondary | ICD-10-CM

## 2015-01-24 DIAGNOSIS — I4719 Other supraventricular tachycardia: Secondary | ICD-10-CM

## 2015-01-24 MED ORDER — ATENOLOL 50 MG PO TABS: 50.0000 mg | ORAL_TABLET | Freq: Every day | ORAL | Status: DC

## 2015-01-24 NOTE — Patient Instructions (Addendum)
Medication Instructions:  Your physician recommends that you continue on your current medications as directed. Please refer to the Current Medication list given to you today.   Labwork: None ordered   Testing/Procedures: None ordered  Follow-Up: Your physician wants you to follow-up in:  [place]. [name].  You will receive a reminder letter in the mail two months in advance. If you don't receive a letter, please call our office to schedule the follow-up appointment.   Any Other Special Instructions Will Be Listed Below (If Applicable).

## 2015-01-24 NOTE — Assessment & Plan Note (Signed)
Weight loss encouraged 

## 2015-01-24 NOTE — Progress Notes (Signed)
Reviewed and agree with assessment/plan. 

## 2015-01-24 NOTE — Patient Instructions (Addendum)
Continue on current CPAP At bedtime   Goal is to wear everynight for at least 6hr  Work on weight loss  follow up in 1 year with Dr. Halford Chessman

## 2015-01-24 NOTE — Assessment & Plan Note (Addendum)
Severe sleep apnea, controlled on C Pap He is encouraged on CPAP compliance with a goal at 6 hours each night  Weight loss, and do not drive for sleepy

## 2015-01-24 NOTE — Progress Notes (Signed)
Subjective:    Patient ID: Anthony [name], male    DOB: Apr 20, 1955, 59 y.o.   MRN: [id number]  HPI 59 year old male with severe sleep apnea  TEST ;  2013 HST >AHI 30      01/24/2015 Follow up : OSA  Patient presents for a yearly follow-up for sleep apnea, He says he's been doing very well with his C Pap machine Wears it most nights for 6 hours. Feels rested. Denies any significant daytime sleepiness. Download from August 16 - November 13 shows 59% compliance on [employer]. He has good control with an AHI 0.7, minimal leaks. And wears it for about 6 hours each night. We discussed on improved compliance. Says that his humidifier doesn't work. Sometimes we discussed taking his machine by the DME company. Patient does drive a truck . Denies any daytime sleepiness or driving issues. Advised not to drive a sleepy.   Past Medical History  Diagnosis Date  . Diabetes mellitus     type II  . Hypertension   . Hyperlipidemia   . GERD (gastroesophageal reflux disease)   . DJD (degenerative joint disease)     per MRI of LS spine 2005  . Chronic back pain   . Hypogonadism male     dx w/ hypogonadism elsewhere ~ 2011  . Normal cardiac stress test     (-) stress test 2003, 2006  . S/P cardiac cath      04-2006 neg per pt--CP improved w/ PPIs  . Aortic root enlargement (HCC)     CT 42 mm 2014  . Tachycardia     ER 07-2012, ?SCR, on atenolol  . Sleep apnea     uses cpap   Current Outpatient Prescriptions on File Prior to Visit  Medication Sig Dispense Refill  . aspirin 81 MG tablet Take 81 mg by mouth daily.      Marland Kitchen atenolol (TENORMIN) 50 MG tablet Take 1 tablet (50 mg total) by mouth daily. 90 tablet 1  . atorvastatin (LIPITOR) 40 MG tablet Take 20 mg by mouth every other day.    . canagliflozin (INVOKANA) 300 MG TABS tablet Take 300 mg by mouth daily before breakfast. 30 tablet 3  . hydrochlorothiazide (HYDRODIURIL) 25 MG tablet Take 1 tablet (25 mg total) by mouth daily. 30 tablet 1  .  losartan (COZAAR) 25 MG tablet Take 1 tablet (25 mg total) by mouth daily. 30 tablet 6  . metFORMIN (GLUCOPHAGE) 850 MG tablet Take 1 tablet (850 mg total) by mouth 2 (two) times daily with a meal. 60 tablet 6   No current facility-administered medications on file prior to visit.    Review of Systems Constitutional:   No  weight loss, night sweats,  Fevers, chills, fatigue, or  lassitude.  HEENT:   No headaches,  Difficulty swallowing,  Tooth/dental problems, or  Sore throat,                No sneezing, itching, ear ache, nasal congestion, post nasal drip,   CV:  No chest pain,  Orthopnea, PND, swelling in lower extremities, anasarca, dizziness, palpitations, syncope.   GI  No heartburn, indigestion, abdominal pain, nausea, vomiting, diarrhea, change in bowel habits, loss of appetite, bloody stools.   Resp: No shortness of breath with exertion or at rest.  No excess mucus, no productive cough,  No non-productive cough,  No coughing up of blood.  No change in color of mucus.  No wheezing.  No chest wall deformity  Skin: no rash or lesions.  GU: no dysuria, change in color of urine, no urgency or frequency.  No flank pain, no hematuria   MS:  No joint pain or swelling.  No decreased range of motion.  No back pain.  Psych:  No change in mood or affect. No depression or anxiety.  No memory loss.         Objective:   Physical Exam GEN: A/Ox3; pleasant , NAD, well nourished  Vital signs reviewed  HEENT:  [place]/AT,  EACs-clear, TMs-wnl, NOSE-clear, THROAT-clear, no lesions, no postnasal drip or exudate noted. Class 2-3 MP airway   NECK:  Supple w/ fair ROM; no JVD; normal carotid impulses w/o bruits; no thyromegaly or nodules palpated; no lymphadenopathy.  RESP  Clear  P & A; w/o, wheezes/ rales/ or rhonchi.no accessory muscle use, no dullness to percussion  CARD:  RRR, no m/r/g  , no peripheral edema, pulses intact, no cyanosis or clubbing.  GI:   Soft & nt; nml bowel sounds; no  organomegaly or masses detected.  Musco: Warm bil, no deformities or joint swelling noted.   Neuro: alert, no focal deficits noted.    Skin: Warm, no lesions or rashes         Assessment & Plan:

## 2015-01-24 NOTE — Progress Notes (Signed)
01/24/2015 Anthony [name]   May 08, 1955  [id number]  Primary Physician Kathlene November, MD Primary Cardiologist: [name] Electrophysiologist: Dr. Caryl Comes  Reason for Visit/CC: 1 year follow-up for aortic root dilatation and atrial arrhthymias   HPI:  The patient is a 59 y/o male followed by Dr. [name]. In 2008, he underwent a LHC by Dr. Haroldine Laws, which showed normal coronaries and normal LVF with an EF of 50%. He was also seen by Dr. Caryl Comes in 2014 for atrial arrhthymias with ? SVT and atrial couplets. He has been treated with atenolol and has done well since. He denies any recurrent issues with palpitations. He also has a h/o aortic rood dilatation, which is followed by yearly CT scans. His most recent chest CT was 10/2014 which showed stable dilatation of the aorta at sinus of Valsalva level and measured at 4.2 cm. Non obstructive CAD noted. Additional medical problems include treated HTN, HLD and DM. His lipid profile is followed by his PCP, Dr. Larose Kells. His most recent lipid panel 12/2014 showed excellent levels with an LDL of 76, HDL of 47 and triglyceride level of 116. His most recent Hgb A1c was also at goal at 7.1. He has normal renal function with a Scr of 0.89.   He presents to clinic today for routine f/u. He reports that he has done well since his last OV in 05/2013. He denies any chest pain or dyspnea. No limitations with physical activity. 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 His only issue has been difficulties adhering to a low sodium/ heart healthy diet due to his occupation.  He is often eating on the go at fast food establishments.    Current Outpatient Prescriptions  Medication Sig Dispense Refill  . aspirin 81 MG tablet Take 81 mg by mouth daily.      Marland Kitchen atenolol (TENORMIN) 50 MG tablet Take 1 tablet (50 mg total) by mouth daily. 30 tablet 6  . atorvastatin (LIPITOR) 40 MG tablet Take 20 mg by mouth every other day.    . canagliflozin (INVOKANA) 300 MG TABS  tablet Take 300 mg by mouth daily before breakfast. 30 tablet 3  . hydrochlorothiazide (HYDRODIURIL) 25 MG tablet Take 1 tablet (25 mg total) by mouth daily. 30 tablet 1  . losartan (COZAAR) 25 MG tablet Take 1 tablet (25 mg total) by mouth daily. 30 tablet 6  . metFORMIN (GLUCOPHAGE) 850 MG tablet Take 1 tablet (850 mg total) by mouth 2 (two) times daily with a meal. 60 tablet 6   No current facility-administered medications for this visit.    No Known Allergies  Social History   Social History  . Marital Status: Married    Spouse Name: N/A  . Number of Children: 2  . Years of Education: N/A   Occupational History  . TRUCK DRIVER    Social History Main Topics  . Smoking status: Former Smoker    Types: Cigars    Quit date: 03/12/1981  . Smokeless tobacco: Never Used     Comment: smoked 1-2 cigers per week x 1 yr.    . Alcohol Use: Yes     Comment: occassinally  . Drug Use: No  . Sexual Activity: Not on file   Other Topics Concern  . Not on file   Social History Narrative   Truck driver (local)     Review of Systems: General: negative for chills, fever, night sweats or weight changes.  Cardiovascular: negative for chest  pain, dyspnea on exertion, edema, orthopnea, palpitations, paroxysmal nocturnal dyspnea or shortness of breath Dermatological: negative for rash Respiratory: negative for cough or wheezing Urologic: negative for hematuria Abdominal: negative for nausea, vomiting, diarrhea, bright red blood per rectum, melena, or hematemesis Neurologic: negative for visual changes, syncope, or dizziness All other systems reviewed and are otherwise negative except as noted above.    Blood pressure 102/66, pulse 56, height 6\' 3"  (1.905 m), weight 273 lb 1.9 oz (123.886 kg).  General appearance: alert, cooperative and no distress Neck: no carotid bruit and no JVD Lungs: clear to auscultation bilaterally Heart: regular rate and rhythm, S1, S2 normal, no murmur, click,  rub or gallop Abdomen: obese, non-tender no masses  Extremities: no [name] Pulses: 2+ and symmetric Skin: warm and dry Neurologic: Grossly normal  EKG sinus brady. 56 bpm. No changes from previous  ASSESSMENT AND PLAN:    1. Aortic Root Dilatation: followed by yearly CT scans. His most recent chest CT was 10/2014 which showed stable dilatation of the aorta at sinus of Valsalva level, measured at 4.2 cm. BP is well controlled. He does not smoke.   2. H/o Symptomatic Atrial Arrhthymias: well controlled with atenolol. HR in the mid 50s but asymptomatic. BP stable.   3. HTN: well controlled on current regimen. Followed by PCP.   4. HLD: followed by PCP. Well controlled on Lipitor. Recent Lipid panel 12/2014: LDL 76, HDL 47, Trig 116.   5. DM: followed by PCP and controlled with recent Hgb A1c at 7.1  6. Obesity: patient with truncal obesity. Weight loss through lifestyle modification through improved diet and increase in physical activity advised.    PLAN  Continue current plan of care. F/u with Dr. [name] in 6 months.   SIMMONS, BRITTAINY PA-C 01/24/2015 9:05 AM

## 2015-01-28 ENCOUNTER — Other Ambulatory Visit: Payer: Self-pay | Admitting: Internal Medicine

## 2015-02-09 ENCOUNTER — Encounter: Payer: Self-pay | Admitting: Adult Health

## 2015-03-20 ENCOUNTER — Other Ambulatory Visit: Payer: Self-pay | Admitting: Internal Medicine

## 2015-04-18 ENCOUNTER — Other Ambulatory Visit: Payer: Self-pay | Admitting: Internal Medicine

## 2015-05-08 ENCOUNTER — Other Ambulatory Visit: Payer: Self-pay | Admitting: Internal Medicine

## 2015-06-03 ENCOUNTER — Telehealth: Payer: Self-pay

## 2015-06-03 NOTE — Telephone Encounter (Signed)
Ledt message for return call to update chart information.

## 2015-06-06 ENCOUNTER — Ambulatory Visit (INDEPENDENT_AMBULATORY_CARE_PROVIDER_SITE_OTHER): Payer: BLUE CROSS/BLUE SHIELD | Admitting: Internal Medicine

## 2015-06-06 ENCOUNTER — Encounter: Payer: Self-pay | Admitting: Internal Medicine

## 2015-06-06 VITALS — BP 124/78 | HR 70 | Temp 97.7°F | Ht 75.0 in | Wt 256.6 lb

## 2015-06-06 DIAGNOSIS — Z0000 Encounter for general adult medical examination without abnormal findings: Secondary | ICD-10-CM | POA: Diagnosis not present

## 2015-06-06 DIAGNOSIS — Z125 Encounter for screening for malignant neoplasm of prostate: Secondary | ICD-10-CM | POA: Diagnosis not present

## 2015-06-06 DIAGNOSIS — Z23 Encounter for immunization: Secondary | ICD-10-CM | POA: Diagnosis not present

## 2015-06-06 DIAGNOSIS — Z09 Encounter for follow-up examination after completed treatment for conditions other than malignant neoplasm: Secondary | ICD-10-CM

## 2015-06-06 LAB — HEMOGLOBIN A1C: HEMOGLOBIN A1C: 7.5 % — AB (ref 4.6–6.5)

## 2015-06-06 LAB — CBC WITH DIFFERENTIAL/PLATELET
BASOS ABS: 0 10*3/uL (ref 0.0–0.1)
Basophils Relative: 0.5 % (ref 0.0–3.0)
EOS ABS: 0.1 10*3/uL (ref 0.0–0.7)
Eosinophils Relative: 1.2 % (ref 0.0–5.0)
HCT: 41.9 % (ref 39.0–52.0)
Hemoglobin: 14.3 g/dL (ref 13.0–17.0)
LYMPHS ABS: 1.4 10*3/uL (ref 0.7–4.0)
Lymphocytes Relative: 19.1 % (ref 12.0–46.0)
MCHC: 34.1 g/dL (ref 30.0–36.0)
MCV: 90 fl (ref 78.0–100.0)
Monocytes Absolute: 0.6 10*3/uL (ref 0.1–1.0)
Monocytes Relative: 8.7 % (ref 3.0–12.0)
NEUTROS ABS: 5.1 10*3/uL (ref 1.4–7.7)
NEUTROS PCT: 70.5 % (ref 43.0–77.0)
PLATELETS: 286 10*3/uL (ref 150.0–400.0)
RBC: 4.66 Mil/uL (ref 4.22–5.81)
RDW: 12.7 % (ref 11.5–15.5)
WBC: 7.3 10*3/uL (ref 4.0–10.5)

## 2015-06-06 LAB — BASIC METABOLIC PANEL
BUN: 23 mg/dL (ref 6–23)
CALCIUM: 9.4 mg/dL (ref 8.4–10.5)
CO2: 28 meq/L (ref 19–32)
Chloride: 101 mEq/L (ref 96–112)
Creatinine, Ser: 0.85 mg/dL (ref 0.40–1.50)
GFR: 98.49 mL/min (ref >60.00)
GLUCOSE: 135 mg/dL — AB (ref 70–99)
POTASSIUM: 4.3 meq/L (ref 3.5–5.1)
SODIUM: 137 meq/L (ref 135–145)

## 2015-06-06 LAB — AST: AST: 15 U/L (ref 0–37)

## 2015-06-06 LAB — ALT: ALT: 19 U/L (ref 0–53)

## 2015-06-06 LAB — PSA: PSA: 0.55 ng/mL (ref 0.10–4.00)

## 2015-06-06 NOTE — Patient Instructions (Signed)
GO TO THE LAB :      Get the blood work     GO TO THE FRONT DESK Schedule your next appointment for a  Routine check up When?   4 months Fasting?  Yes

## 2015-06-06 NOTE — Assessment & Plan Note (Signed)
Td  10-2008; zostavax 2014; pnm shot 2014; prevnar 05-2015  + FH Prostate cancer  DRE (-) today, PSAs stable, last 12-2014 Had 3 Cscope , last  11-2013, next 3 years diet  and exercise -- discussed  Labs: PSA, BMP, AST, ALT, CBC, A1c

## 2015-06-06 NOTE — Progress Notes (Signed)
Pre visit review using our clinic review tool, if applicable. No additional management support is needed unless otherwise documented below in the visit note. 

## 2015-06-06 NOTE — Assessment & Plan Note (Signed)
DM: Continue metformin. Started Invokana-2016, doing better with diet exercise, + weight loss,  check labs HTN: Continue losartan, pt self decrease atenolol to 25 mg~3 weeks ago because he was feeling tired and having bradycardia. BP today remains normal, heart rate in the 70s ---> continue present care RTC 4 months

## 2015-06-06 NOTE — Progress Notes (Signed)
Subjective:    Patient ID: Anthony [name], male    DOB: 05/18/1955, 60 y.o.   MRN: [id number]  DOS:  06/06/2015 Type of visit - description : CPX Interval history: No major concerns, has improved his lifestyle and lost some weight   Review of Systems  Constitutional: No fever. No chills. No unexplained wt changes. No unusual sweats  HEENT: No dental problems, no ear discharge, no facial swelling, no voice changes. No eye discharge, no eye  redness , no  intolerance to light   Respiratory: No wheezing , no  difficulty breathing. No cough , no mucus production  Cardiovascular: No CP, no leg swelling , no  Palpitations  GI: no nausea, no vomiting, no diarrhea , no  abdominal pain.  No blood in the stools. No dysphagia, no odynophagia    Endocrine: No polyphagia, no polyuria , no polydipsia  GU: No dysuria, gross hematuria, difficulty urinating. Occasionally has difficulty urinating. No other symptoms.  Musculoskeletal: No joint swellings or unusual aches or pains  Skin: No change in the color of the skin, palor , no  Rash  Allergic, immunologic: No environmental allergies , no  food allergies  Neurological: No dizziness no  syncope. No headaches. No diplopia, no slurred, no slurred speech, no motor deficits, no facial  Numbness  Hematological: No enlarged lymph nodes, no easy bruising , no unusual bleedings  Psychiatry: No suicidal ideas, no hallucinations, no beavior problems, no confusion.  No unusual/severe anxiety, no depression  Past Medical History  Diagnosis Date  . Diabetes mellitus     type II  . Hypertension   . Hyperlipidemia   . GERD (gastroesophageal reflux disease)   . DJD (degenerative joint disease)     per MRI of LS spine 2005  . Chronic back pain   . Hypogonadism male     dx w/ hypogonadism elsewhere ~ 2011  . Normal cardiac stress test     (-) stress test 2003, 2006  . S/P cardiac cath      04-2006 neg per pt--CP improved w/ PPIs  . Aortic root  enlargement (HCC)     CT 42 mm 2014  . Tachycardia     ER 07-2012, ?SCR, on atenolol  . Sleep apnea     uses cpap    Past Surgical History  Procedure Laterality Date  . Cystectomy      from back  . Cardiac catheterization    . Colonoscopy    . Polypectomy      Social History   Social History  . Marital Status: Married    Spouse Name: N/A  . Number of Children: 2  . Years of Education: N/A   Occupational History  . TRUCK DRIVER    Social History Main Topics  . Smoking status: Former Smoker    Types: Cigars    Quit date: 03/12/1981  . Smokeless tobacco: Never Used  . Alcohol Use: 0.0 oz/week    0 Standard drinks or equivalent per week     Comment: occassinally  . Drug Use: No  . Sexual Activity: Not on file   Other Topics Concern  . Not on file   Social History Narrative   Truck driver (local)     Family History  Problem Relation Age of Onset  . Colon polyps Brother   . Heart disease Brother     CABG  . Heart disease Father     CABG  . Diabetes Other  cousins  . Prostate cancer Brother     age 22  . Colon cancer Neg Hx   . Rectal cancer Neg Hx   . Esophageal cancer Neg Hx   . Stomach cancer Paternal Uncle   . Bladder Cancer Paternal Uncle        Medication List       This list is accurate as of: 06/06/15 12:48 PM.  Always use your most recent med list.               aspirin 81 MG tablet  Take 81 mg by mouth daily.     atenolol 50 MG tablet  Commonly known as:  TENORMIN  Take 25 mg by mouth daily.     atorvastatin 40 MG tablet  Commonly known as:  LIPITOR  Take 0.5 tablets (20 mg total) by mouth every other day.     canagliflozin 300 MG Tabs tablet  Commonly known as:  INVOKANA  Take 1 tablet (300 mg total) by mouth daily before breakfast.     hydrochlorothiazide 25 MG tablet  Commonly known as:  HYDRODIURIL  Take 1 tablet (25 mg total) by mouth daily.     losartan 25 MG tablet  Commonly known as:  COZAAR  Take 1 tablet (25  mg total) by mouth daily.     metFORMIN 850 MG tablet  Commonly known as:  GLUCOPHAGE  Take 1 tablet (850 mg total) by mouth 2 (two) times daily with a meal.     ONE TOUCH ULTRA TEST test strip  Generic drug:  glucose blood  CHECK BLOOD SUGAR NO MORE THAN TWICE DAILY.           Objective:   Physical Exam BP 124/78 mmHg  Pulse 70  Temp(Src) 97.7 F (36.5 C) (Oral)  Ht 6\' 3"  (1.905 m)  Wt 256 lb 9.6 oz (116.393 kg)  BMI 32.07 kg/m2  SpO2 97%  General:   Well developed, well nourished . NAD.  Neck: No  Thyromegaly  HEENT:  Normocephalic . Face symmetric, atraumatic Lungs:  CTA B Normal respiratory effort, no intercostal retractions, no accessory muscle use. Heart: RRR,  no murmur.  No pretibial edema bilaterally  Abdomen:  Not distended, soft, non-tender. No rebound or rigidity.   Skin: Exposed areas without rash. Not pale. Not jaundice DRE: Normal sphincter tone, normal prostate. Neurologic:  alert & oriented X3.  Speech normal, gait appropriate for age and unassisted Strength symmetric and appropriate for age.  Psych: Cognition and judgment appear intact.  Cooperative with normal attention span and concentration.  Behavior appropriate. No anxious or depressed appearing.    Assessment & Plan:   Assessment > DM HTN Hyperlipidemia CV --Tachycardia : ER eval 2014,   rx atenolol --stress test 2003, 2006 --Chest pain-2008,  (-) cardiac cath, sx decrease with PPIs ---Aorta enlarment per CT  2014, last CT 10-2014 stable OSA , severe - on Cpap GERD DJD, Chronic back pain, MRI 2005 showed OA Hypogonadism DX 2011 + FH Prostate ca brother age 74    PLAN DM: Continue metformin. Started Invokana-2016, doing better with diet exercise, + weight loss,  check labs HTN: Continue losartan, pt self decrease atenolol to 25 mg~3 weeks ago because he was feeling tired and having bradycardia. BP today remains normal, heart rate in the 70s ---> continue present care RTC 4  months

## 2015-06-08 MED ORDER — PIOGLITAZONE HCL 30 MG PO TABS: 30.0000 mg | ORAL_TABLET | Freq: Every day | ORAL | Status: DC

## 2015-06-08 NOTE — Addendum Note (Signed)
Addended byDamita Dunnings D on: 06/08/2015 01:59 PM   Modules accepted: Orders

## 2015-06-11 ENCOUNTER — Other Ambulatory Visit: Payer: Self-pay | Admitting: Internal Medicine

## 2015-06-25 ENCOUNTER — Other Ambulatory Visit: Payer: Self-pay | Admitting: Internal Medicine

## 2015-06-30 ENCOUNTER — Encounter: Payer: Self-pay | Admitting: Gastroenterology

## 2015-08-05 ENCOUNTER — Other Ambulatory Visit: Payer: Self-pay | Admitting: Internal Medicine

## 2015-08-10 ENCOUNTER — Ambulatory Visit: Payer: BLUE CROSS/BLUE SHIELD | Admitting: Cardiovascular Disease

## 2015-08-18 ENCOUNTER — Ambulatory Visit (INDEPENDENT_AMBULATORY_CARE_PROVIDER_SITE_OTHER): Payer: BLUE CROSS/BLUE SHIELD | Admitting: Cardiovascular Disease

## 2015-08-18 ENCOUNTER — Encounter: Payer: Self-pay | Admitting: Cardiovascular Disease

## 2015-08-18 VITALS — BP 104/70 | HR 72 | Ht 75.0 in | Wt 267.8 lb

## 2015-08-18 DIAGNOSIS — Q2549 Other congenital malformations of aorta: Secondary | ICD-10-CM

## 2015-08-18 DIAGNOSIS — Q2543 Congenital aneurysm of aorta: Secondary | ICD-10-CM | POA: Diagnosis not present

## 2015-08-18 DIAGNOSIS — I10 Essential (primary) hypertension: Secondary | ICD-10-CM

## 2015-08-18 NOTE — Patient Instructions (Addendum)

## 2015-08-18 NOTE — Progress Notes (Signed)
Patient ID: Anthony [name], male   DOB: [birth year], 60 y.o.   MRN: [id number]   08/18/2015 Anthony [name]   [birth year]  [id number]  Primary Physician Anthony November, MD Primary Cardiologist: Anthony [name] Electrophysiologist: Dr. Caryl [name]  Reason for Visit/CC: 1 year follow-up for aortic root dilatation and atrial arrhthymias   HPI:  The patient is a 60 y/o male. In 2008, he underwent a LHC by Dr. Haroldine [name], which showed normal coronaries and normal LVF with an EF of 50%. He was also seen by Dr. Caryl [name] in 2014 for atrial arrhthymias with ? SVT and atrial couplets. He has been treated with atenolol and has done well since. He denies any recurrent issues with palpitations. He also has a h/o aortic rood dilatation, which is followed by yearly CT scans. His most recent chest CT was 10/2014 which showed stable dilatation of the aorta at sinus of Valsalva level and measured at 4.2 cm. Non obstructive CAD noted. Additional medical problems include treated HTN, HLD and DM. His lipid profile is followed by his PCP, Dr. Larose [name]. His most recent lipid panel 12/2014 showed excellent levels with an LDL of 76, HDL of 47 and triglyceride level of 116. His most recent Hgb A1c was also at goal at 7.1. He has normal renal function with a Scr of 0.89.   He presents to clinic today for routine f/u. He reports that he has done well since his last OV in 05/2013. He denies any chest pain or dyspnea. No limitations with physical activity. 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 His only issue has been difficulties adhering to a low sodium/ heart healthy diet due to his occupation.  He is often eating on the go at fast food establishments.    Current Outpatient Prescriptions  Medication Sig Dispense Refill  . aspirin 81 MG tablet Take 81 mg by mouth daily.      Marland Kitchen atenolol (TENORMIN) 50 MG tablet Take 25 mg by mouth daily.    Marland Kitchen atorvastatin (LIPITOR) 40 MG tablet Take 0.5 tablets (20 mg total) by mouth every other day. 30  tablet 2  . canagliflozin (INVOKANA) 300 MG TABS tablet Take 1 tablet (300 mg total) by mouth daily before breakfast. 30 tablet 6  . hydrochlorothiazide (HYDRODIURIL) 25 MG tablet Take 1 tablet (25 mg total) by mouth daily. 30 tablet 2  . losartan (COZAAR) 25 MG tablet Take 1 tablet (25 mg total) by mouth daily. 30 tablet 6  . metFORMIN (GLUCOPHAGE) 850 MG tablet Take 1 tablet (850 mg total) by mouth 2 (two) times daily with a meal. 60 tablet 3  . pioglitazone (ACTOS) 30 MG tablet Take 1 tablet (30 mg total) by mouth daily. 30 tablet 5   No current facility-administered medications for this visit.    No Known Allergies  Social History   Social History  . Marital Status: Married    Spouse Name: N/A  . Number of Children: 2  . Years of Education: N/A   Occupational History  . TRUCK DRIVER    Social History Main Topics  . Smoking status: Former Smoker    Types: Cigars    Quit date: 03/12/1981  . Smokeless tobacco: Never Used  . Alcohol Use: 0.0 oz/week    0 Standard drinks or equivalent per week     Comment: occassinally  . Drug Use: No  . Sexual Activity: Not on file   Other Topics Concern  . Not on file  Social History Narrative   Truck driver (local)     Review of Systems: General: negative for chills, fever, night sweats or weight changes.  Cardiovascular: negative for chest pain, dyspnea on exertion, edema, orthopnea, palpitations, paroxysmal nocturnal dyspnea or shortness of breath Dermatological: negative for rash Respiratory: negative for cough or wheezing Urologic: negative for hematuria Abdominal: negative for nausea, vomiting, diarrhea, bright red blood per rectum, melena, or hematemesis Neurologic: negative for visual changes, syncope, or dizziness All other systems reviewed and are otherwise negative except as noted above.    Blood pressure 104/70, pulse 72, height 6\' 3"  (1.905 m), weight 121.473 kg (267 lb 12.8 oz), SpO2 96 %.  General appearance:  alert, cooperative and no distress Neck: no carotid bruit and no JVD Lungs: clear to auscultation bilaterally Heart: regular rate and rhythm, S1, S2 normal, no murmur, click, rub or gallop Abdomen: obese, non-tender no masses  Extremities: plus one LE edema  Pulses: 2+ and symmetric Skin: warm and dry Neurologic: Grossly normal  EKG  11.14.16  sinus brady. 56 bpm. No changes from previous  ASSESSMENT AND PLAN:    1. Aortic Root Dilatation: followed by  CT scans 2014 and 2016 . His most recent chest CT was 10/2014 which showed stable dilatation of the aorta at sinus of Valsalva level, measured at 4.2 cm. BP is well controlled. He does not smoke. Will get TTE to measure sinus and avoid radiation if not adequate can consider MRI/MRA  2. H/o Symptomatic Atrial Arrhthymias: well controlled with atenolol. HR in the mid 50s but asymptomatic. BP stable.   3. HTN: well controlled on current regimen. Followed by PCP. Will try to stop diuretic since he is on invokana now and this also causes a diuresis   4. HLD: followed by PCP. Well controlled on Lipitor. Recent Lipid panel 12/2014: LDL 76, HDL 47, Trig 116.   5. DM: followed by PCP Recent Hgb A1c at 7.1 Invokana started 6 months ago with increased diuresis   6. Obesity: patient with truncal obesity. Weight loss through lifestyle modification through improved diet and increase in physical activity advised.    PLAN  Continue current plan of care. F/u with me in a year if echo ok He will let us know if BP and edema ok off diuretic   Jenkins Rouge

## 2015-09-02 ENCOUNTER — Other Ambulatory Visit: Payer: Self-pay | Admitting: Cardiology

## 2015-09-05 NOTE — Telephone Encounter (Signed)
Should this be 25 mg or 50 mg? Last office visit has 25 mg qd listed, but it was last ordered at 50 mg. Please advise. Thanks, MI

## 2015-09-06 ENCOUNTER — Other Ambulatory Visit: Payer: Self-pay

## 2015-09-06 ENCOUNTER — Ambulatory Visit (HOSPITAL_COMMUNITY): Payer: BLUE CROSS/BLUE SHIELD | Attending: Cardiology

## 2015-09-06 DIAGNOSIS — I119 Hypertensive heart disease without heart failure: Secondary | ICD-10-CM | POA: Diagnosis not present

## 2015-09-06 DIAGNOSIS — I77810 Thoracic aortic ectasia: Secondary | ICD-10-CM | POA: Insufficient documentation

## 2015-09-06 DIAGNOSIS — E119 Type 2 diabetes mellitus without complications: Secondary | ICD-10-CM | POA: Insufficient documentation

## 2015-09-06 DIAGNOSIS — E785 Hyperlipidemia, unspecified: Secondary | ICD-10-CM | POA: Diagnosis not present

## 2015-09-06 DIAGNOSIS — Z87891 Personal history of nicotine dependence: Secondary | ICD-10-CM | POA: Insufficient documentation

## 2015-09-06 DIAGNOSIS — Q2543 Congenital aneurysm of aorta: Secondary | ICD-10-CM

## 2015-09-06 DIAGNOSIS — Q2549 Other congenital malformations of aorta: Secondary | ICD-10-CM

## 2015-09-06 LAB — ECHOCARDIOGRAM COMPLETE
AVLVOTPG: 6 mmHg
Ao-asc: 33 cm
CHL CUP DOP CALC LVOT VTI: 28.5 cm
CHL CUP MV DEC (S): 239
E decel time: 239 msec
E/e' ratio: 6.7
FS: 31 % (ref 28–44)
IV/PV OW: 1.04
LA diam end sys: 49 mm
LA diam index: 1.98 cm/m2
LASIZE: 49 mm
LAVOL: 82 mL
LAVOLA4C: 70 mL
LAVOLIN: 33.1 mL/m2
LV E/e' medial: 6.7
LV e' LATERAL: 10.3 cm/s
LVEEAVG: 6.7
LVOT SV: 118 mL
LVOT area: 4.15 cm2
LVOT diameter: 23 mm
LVOTPV: 125 cm/s
MV pk A vel: 57.1 m/s
MVPKEVEL: 69 m/s
PW: 9.29 mm — AB (ref 0.6–1.1)
TDI e' lateral: 10.3
TDI e' medial: 8.77

## 2015-09-07 NOTE — Telephone Encounter (Signed)
Called patient to verify how he is currently taking his medication. Left message for patient to call back. Patient is suppose to be taking Atenolol 25 mg by mouth daily per last office visit.

## 2015-09-09 NOTE — Telephone Encounter (Signed)
Patient stated that he had discussed reducing dose of atenolol with Dr. [name]. Patient started cutting his Atenolol 50 mg in half, and he would like to stay on Atenolol 25 mg. Patient stated he feels better on the reduced dose. Will forward to Dr. [name] for order. Patient verbalized understanding.

## 2015-09-26 ENCOUNTER — Ambulatory Visit: Payer: BLUE CROSS/BLUE SHIELD | Admitting: Internal Medicine

## 2015-10-26 ENCOUNTER — Ambulatory Visit: Payer: BLUE CROSS/BLUE SHIELD | Admitting: Internal Medicine

## 2015-11-02 ENCOUNTER — Other Ambulatory Visit: Payer: Self-pay | Admitting: Internal Medicine

## 2015-11-13 ENCOUNTER — Other Ambulatory Visit: Payer: Self-pay | Admitting: Internal Medicine

## 2015-11-28 ENCOUNTER — Other Ambulatory Visit: Payer: Self-pay | Admitting: Internal Medicine

## 2015-12-02 LAB — HM DIABETES EYE EXAM

## 2015-12-09 ENCOUNTER — Encounter: Payer: Self-pay | Admitting: Internal Medicine

## 2015-12-12 ENCOUNTER — Other Ambulatory Visit: Payer: Self-pay | Admitting: Internal Medicine

## 2016-01-02 ENCOUNTER — Ambulatory Visit (INDEPENDENT_AMBULATORY_CARE_PROVIDER_SITE_OTHER): Payer: BLUE CROSS/BLUE SHIELD | Admitting: Internal Medicine

## 2016-01-02 ENCOUNTER — Encounter: Payer: Self-pay | Admitting: Internal Medicine

## 2016-01-02 VITALS — BP 124/68 | HR 68 | Temp 98.1°F | Resp 14 | Ht 75.0 in | Wt 267.4 lb

## 2016-01-02 DIAGNOSIS — I10 Essential (primary) hypertension: Secondary | ICD-10-CM

## 2016-01-02 DIAGNOSIS — Z23 Encounter for immunization: Secondary | ICD-10-CM | POA: Diagnosis not present

## 2016-01-02 DIAGNOSIS — E119 Type 2 diabetes mellitus without complications: Secondary | ICD-10-CM

## 2016-01-02 LAB — BASIC METABOLIC PANEL
BUN: 26 mg/dL — ABNORMAL HIGH (ref 6–23)
CALCIUM: 9.5 mg/dL (ref 8.4–10.5)
CO2: 31 mEq/L (ref 19–32)
Chloride: 101 mEq/L (ref 96–112)
Creatinine, Ser: 0.89 mg/dL (ref 0.40–1.50)
GFR: 93.21 mL/min (ref >60.00)
GLUCOSE: 135 mg/dL — AB (ref 70–99)
Potassium: 4.1 mEq/L (ref 3.5–5.1)
SODIUM: 139 meq/L (ref 135–145)

## 2016-01-02 LAB — AST: AST: 15 U/L (ref 0–37)

## 2016-01-02 LAB — ALT: ALT: 15 U/L (ref 0–53)

## 2016-01-02 LAB — HEMOGLOBIN A1C: Hgb A1c MFr Bld: 7.1 % — ABNORMAL HIGH (ref 4.6–6.5)

## 2016-01-02 NOTE — Progress Notes (Signed)
Pre visit review using our clinic review tool, if applicable. No additional management support is needed unless otherwise documented below in the visit note. 

## 2016-01-02 NOTE — Patient Instructions (Signed)
GO TO THE LAB : Get the blood work     GO TO THE FRONT DESK Schedule your next appointment for a  physical exam by 05-2016   Think about calorie counting

## 2016-01-02 NOTE — Progress Notes (Signed)
Subjective:    Patient ID: Anthony [name], male    DOB: 1955/06/14, 60 y.o.   MRN: [id number]  DOS:  01/02/2016 Type of visit - description : Routine visit Interval history: DM: Good med compliance, needs to  improve his diet, not exercising enough HTN: Ambulatory BPs excellent. Good med compliance Note from cardiology reviewed  Wt Readings from Last 3 Encounters:  01/02/16 267 lb 6 oz (121.3 kg)  08/18/15 267 lb 12.8 oz (121.5 kg)  06/06/15 256 lb 9.6 oz (116.4 kg)     Review of Systems Denies chest pain or difficulty breathing. No palpitations or lower extremity edema No nausea, vomiting, diarrhea  Past Medical History:  Diagnosis Date  . Aortic root enlargement (HCC)    CT 42 mm 2014  . Chronic back pain   . Diabetes mellitus    type II  . DJD (degenerative joint disease)    per MRI of LS spine 2005  . GERD (gastroesophageal reflux disease)   . Hyperlipidemia   . Hypertension   . Hypogonadism male    dx w/ hypogonadism elsewhere ~ 2011  . Normal cardiac stress test    (-) stress test 2003, 2006  . S/P cardiac cath     04-2006 neg per pt--CP improved w/ PPIs  . Sleep apnea    uses cpap  . Tachycardia    ER 07-2012, ?SCR, on atenolol    Past Surgical History:  Procedure Laterality Date  . CARDIAC CATHETERIZATION    . CYSTECTOMY     from back  . POLYPECTOMY      Social History   Social History  . Marital status: Married    Spouse name: N/A  . Number of children: 2  . 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 Smoking status: Former Smoker    Types: Cigars    Quit date: 03/12/1981  . Smokeless tobacco: Never Used  . Alcohol use 0.0 oz/week     Comment: occassinally  . Drug use: No  . Sexual activity: Not on file   Other Topics Concern  . Not on file   Social History Narrative   Truck driver (local)        Medication List       Accurate as of 01/02/16 11:15 AM. Always use  your most recent med list.          aspirin 81 MG tablet Take 81 mg by mouth daily.   atenolol 50 MG tablet Commonly known as:  TENORMIN TAKE 1 TABLET (50 MG TOTAL) BY MOUTH DAILY.   atorvastatin 40 MG tablet Commonly known as:  LIPITOR Take 0.5 tablets (20 mg total) by mouth every other day.   canagliflozin 300 MG Tabs tablet Commonly known as:  INVOKANA Take 1 tablet (300 mg total) by mouth daily before breakfast.   hydrochlorothiazide 25 MG tablet Commonly known as:  HYDRODIURIL Take 1 tablet (25 mg total) by mouth daily.   losartan 25 MG tablet Commonly known as:  COZAAR Take 1 tablet (25 mg total) by mouth daily.   metFORMIN 850 MG tablet Commonly known as:  GLUCOPHAGE Take 1 tablet (850 mg total) by mouth 2 (two) times daily with a meal.   pioglitazone 30 MG tablet Commonly known as:  ACTOS Take 1 tablet (30 mg total) by mouth daily.          Objective:   Physical Exam  BP 124/68 (BP Location: Left Arm, Patient Position: Sitting, Cuff Size: Normal)   Pulse 68   Temp 98.1 F (36.7 C) (Oral)   Resp 14   Ht 6\' 3"  (1.905 m)   Wt 267 lb 6 oz (121.3 kg)   SpO2 96%   BMI 33.42 kg/m  General:   Well developed, well nourished . NAD.  HEENT:  Normocephalic . Face symmetric, atraumatic Lungs:  CTA B Normal respiratory effort, no intercostal retractions, no accessory muscle use. Heart: RRR,  no murmur.  No pretibial edema bilaterally  DIABETIC FEET EXAM: No lower extremity edema Normal pedal pulses bilaterally Skin normal, nails normal, no calluses Pinprick examination of the feet normal. Skin: Not pale. Not jaundice Neurologic:  alert & oriented X3.  Speech normal, gait appropriate for age and unassisted Psych--  Cognition and judgment appear intact.  Cooperative with normal attention span and concentration.  Behavior appropriate. No anxious or depressed appearing.      Assessment & Plan:  Assessment > DM HTN Hyperlipidemia CV Dr [name],  next visit 08-2016 --Tachycardia : ER eval 2014,   rx atenolol --stress test 2003, 2006; Chest pain-2008,  (-) cardiac cath, sx decrease with PPIs ---Aorta enlargment per CT  2014, last CT 10-2014 stable OSA , severe - on Cpap GERD DJD, Chronic back pain, MRI 2005 showed OA Hypogonadism DX 2011 + FH Prostate ca brother age 79    PLAN DM: Extensive discussion about diet and exercise. Calorie counting encourage. Plans to get a treadmill and become more active. [place]-term options >>> ?bariatric surgery , ? Saxenda .  For now, cont w/  invokana, metformin,  Actos, Will check the A1c, AST, ALT. Foot exam negative HTN: Continue atenolol, losartan. Ambulatory BP is excellent, check a BMP Flu shot today  RTC 05-2016, CPX  Today, I spent more than 25   min with the patient: >50% of the time counseling regards diet, exercise, [place]-term weight control options that include bariatric surgery /saxenda.

## 2016-01-03 NOTE — Assessment & Plan Note (Signed)
DM: Extensive discussion about diet and exercise. Calorie counting encourage. Plans to get a treadmill and become more active. [place]-term options >>> ?bariatric surgery , ? Saxenda .  For now, cont w/  invokana, metformin,  Actos, Will check the A1c, AST, ALT. Foot exam negative HTN: Continue atenolol, losartan. Ambulatory BP is excellent, check a BMP Flu shot today  RTC 05-2016, CPX

## 2016-01-14 ENCOUNTER — Other Ambulatory Visit: Payer: Self-pay | Admitting: Internal Medicine

## 2016-01-27 ENCOUNTER — Other Ambulatory Visit: Payer: Self-pay | Admitting: Internal Medicine

## 2016-01-29 ENCOUNTER — Other Ambulatory Visit: Payer: Self-pay | Admitting: Internal Medicine

## 2016-03-05 ENCOUNTER — Other Ambulatory Visit: Payer: Self-pay | Admitting: Internal Medicine

## 2016-03-24 ENCOUNTER — Other Ambulatory Visit: Payer: Self-pay | Admitting: Internal Medicine

## 2016-05-14 ENCOUNTER — Telehealth: Payer: Self-pay | Admitting: Internal Medicine

## 2016-05-14 DIAGNOSIS — E119 Type 2 diabetes mellitus without complications: Secondary | ICD-10-CM

## 2016-05-14 NOTE — Telephone Encounter (Signed)
Please advise 

## 2016-05-14 NOTE — Telephone Encounter (Signed)
[place] for A1c dx DM

## 2016-05-14 NOTE — Telephone Encounter (Signed)
Pt would like to have orders placed for A1C. He says that he need to have lab completed for his [employer] physical for work.     Pt would like to come in tomorrow morning, scheduled.

## 2016-05-14 NOTE — Telephone Encounter (Signed)
A1c ordered.

## 2016-05-15 ENCOUNTER — Ambulatory Visit (INDEPENDENT_AMBULATORY_CARE_PROVIDER_SITE_OTHER): Payer: 59 | Admitting: Family Medicine

## 2016-05-15 ENCOUNTER — Encounter: Payer: Self-pay | Admitting: Family Medicine

## 2016-05-15 ENCOUNTER — Other Ambulatory Visit (INDEPENDENT_AMBULATORY_CARE_PROVIDER_SITE_OTHER): Payer: 59

## 2016-05-15 VITALS — BP 114/76 | HR 88 | Temp 98.5°F | Ht 75.0 in | Wt 270.0 lb

## 2016-05-15 DIAGNOSIS — B349 Viral infection, unspecified: Secondary | ICD-10-CM

## 2016-05-15 DIAGNOSIS — E119 Type 2 diabetes mellitus without complications: Secondary | ICD-10-CM | POA: Diagnosis not present

## 2016-05-15 LAB — HEMOGLOBIN A1C: HEMOGLOBIN A1C: 7.2 % — AB (ref 4.6–6.5)

## 2016-05-15 MED ORDER — PROMETHAZINE HCL 25 MG PO TABS: 25.0000 mg | ORAL_TABLET | ORAL | 0 refills | Status: DC | PRN

## 2016-05-15 NOTE — Progress Notes (Signed)
   Subjective:    Patient ID: CARDER BELOW, male    DOB: Jul 31, 1955, 61 y.o.   MRN: [id number]  HPI Here for 2 days of body aches, low grade fevers, chills, nausea, and vomiting 3 times. No abdominal pain. No diarrhea. He passed several formed stools this morning that were black in color, but he has been using Pepto-Bismol a few times. He is drinking fluids but cannot eat food. No cough or ST. No recent travelling.    Review of Systems  Constitutional: Positive for chills and fever.  HENT: Negative.   Eyes: Negative.   Respiratory: Negative.   Cardiovascular: Negative.   Gastrointestinal: Positive for nausea and vomiting. Negative for abdominal distention, abdominal pain, constipation and diarrhea.  Genitourinary: Negative.        Objective:   Physical Exam  Constitutional: He appears well-developed and well-nourished. No distress.  HENT:  Right Ear: External ear normal.  Left Ear: External ear normal.  Nose: Nose normal.  Eyes: Conjunctivae are normal.  Neck: No thyromegaly present.  Cardiovascular: Normal rate, regular rhythm, normal heart sounds and intact distal pulses.   Pulmonary/Chest: Effort normal and breath sounds normal.  Abdominal: Soft. Bowel sounds are normal. He exhibits no distension and no mass. There is no tenderness. There is no rebound and no guarding.  Lymphadenopathy:    He has no cervical adenopathy.          Assessment & Plan:  Viral illness, this should resolve in the next day or two. Drink fluids. Use Phenergan for nausea.  Alysia Penna, MD

## 2016-05-15 NOTE — Patient Instructions (Signed)
WE NOW OFFER   [facility] FAST TRACK!!!  SAME DAY Appointments for ACUTE CARE  Such as: Sprains, Injuries, cuts, abrasions, rashes, muscle pain, joint pain, back pain Colds, flu, sore throats, headache, allergies, cough, fever  Ear pain, sinus and eye infections Abdominal pain, nausea, vomiting, diarrhea, upset stomach Animal/insect bites  3 Easy Ways to Schedule: Walk-In Scheduling Call in scheduling Mychart Sign-up: [email or web address]         

## 2016-05-15 NOTE — Progress Notes (Signed)
Pre visit review using our clinic review tool, if applicable. No additional management support is needed unless otherwise documented below in the visit note. 

## 2016-05-16 ENCOUNTER — Telehealth: Payer: Self-pay | Admitting: Internal Medicine

## 2016-05-16 NOTE — Telephone Encounter (Signed)
Note is ready for pick up here at front office and I left a voice message with this information.

## 2016-05-16 NOTE — Telephone Encounter (Signed)
Pt came in to the office on 05/15/16 3:15 and saw Dr. Sarajane Jews and pt is requesting a work note for yesterday and today would like to pick it up today.

## 2016-05-17 MED ORDER — INSULIN PEN NEEDLE 31G X 5 MM MISC: 1 refills | Status: DC

## 2016-05-17 MED ORDER — LIRAGLUTIDE 18 MG/3ML ~~LOC~~ SOPN: 1.8000 mg | PEN_INJECTOR | Freq: Every day | SUBCUTANEOUS | 1 refills | Status: DC

## 2016-05-24 ENCOUNTER — Ambulatory Visit (INDEPENDENT_AMBULATORY_CARE_PROVIDER_SITE_OTHER): Payer: 59 | Admitting: Adult Health

## 2016-05-24 ENCOUNTER — Encounter: Payer: Self-pay | Admitting: Adult Health

## 2016-05-24 DIAGNOSIS — G4733 Obstructive sleep apnea (adult) (pediatric): Secondary | ICD-10-CM | POA: Diagnosis not present

## 2016-05-24 NOTE — Patient Instructions (Signed)
Continue on current CPAP At bedtime   Work on weight loss  Do not drive if sleepy  Follow up in 1 year with Dr. Halford Chessman  And As needed

## 2016-05-24 NOTE — Progress Notes (Signed)
@Patient  ID: Anthony [name], male    DOB: Aug 02, 1955, 61 y.o.   MRN: [id number]  No chief complaint on file.   Referring provider: Colon Branch, MD  HPI: 61 year old male with severe sleep apnea  TEST ;  2013 HST >AHI 30   05/24/2016 Follow up : OSA  Pt presents for yearly follow up for severe OSA .  Says he is doing great on CPAP wears it every nihgt . Cant live without it.  Feels rested with no sign daytime sleepiness.  Download shows excellent compliance with avg usage at 7hr/night  AHI 0.9, min leaks.         No Known Allergies  Immunization History  Administered Date(s) Administered  . Influenza Split 01/24/2011, 11/30/2011  . Influenza Whole 12/31/2006, 12/24/2007, 12/10/2008  . Influenza,inj,Quad PF,36+ Mos 03/02/2013, 03/01/2014, 12/20/2014, 01/02/2016  . Pneumococcal Conjugate-13 06/06/2015  . Pneumococcal Polysaccharide-23 03/02/2013  . Td 10/29/2008  . Zoster 12/01/2012    Past Medical History:  Diagnosis Date  . Aortic root enlargement (HCC)    CT 42 mm 2014  . Chronic back pain   . Diabetes mellitus    type II  . DJD (degenerative joint disease)    per MRI of LS spine 2005  . GERD (gastroesophageal reflux disease)   . Hyperlipidemia   . Hypertension   . Hypogonadism male    dx w/ hypogonadism elsewhere ~ 2011  . Normal cardiac stress test    (-) stress test 2003, 2006  . S/P cardiac cath     04-2006 neg per pt--CP improved w/ PPIs  . Sleep apnea    uses cpap  . Tachycardia    ER 07-2012, ?SCR, on atenolol    Tobacco History: History  Smoking Status  . Former Smoker  . Types: Cigars  . Quit date: 03/12/1981  Smokeless Tobacco  . Never Used   Counseling given: Not Answered   Outpatient Encounter Prescriptions as of 05/24/2016  Medication Sig  . aspirin 81 MG tablet Take 81 mg by mouth daily.    Marland Kitchen atenolol (TENORMIN) 50 MG tablet Take 25 mg by mouth daily.  Marland Kitchen atorvastatin (LIPITOR) 40 MG tablet Take 0.5 tablets (20 mg total) by mouth  every other day.  . canagliflozin (INVOKANA) 300 MG TABS tablet Take 1 tablet (300 mg total) by mouth daily before breakfast.  . hydrochlorothiazide (HYDRODIURIL) 25 MG tablet Take 1 tablet (25 mg total) by mouth daily.  Marland Kitchen losartan (COZAAR) 25 MG tablet Take 1 tablet (25 mg total) by mouth daily.  . metFORMIN (GLUCOPHAGE) 850 MG tablet Take 1 tablet (850 mg total) by mouth 2 (two) times daily with a meal.  . ONE TOUCH ULTRA TEST test strip CHECK BLOOD SUGAR NO MORE THAN TWICE DAILY.  Marland Kitchen pioglitazone (ACTOS) 30 MG tablet Take 1 tablet (30 mg total) by mouth daily.  . Insulin Pen Needle 31G X 5 MM MISC To use w/ Victoza (Patient not taking: Reported on 05/24/2016)  . liraglutide (VICTOZA) 18 MG/3ML SOPN Inject 0.3 mLs (1.8 mg total) into the skin daily. See dosing guidelines or take as directed by provider. (Patient not taking: Reported on 05/24/2016)  . [DISCONTINUED] promethazine (PHENERGAN) 25 MG tablet Take 1 tablet (25 mg total) by mouth every 4 (four) hours as needed for nausea or vomiting. (Patient not taking: Reported on 05/24/2016)   No facility-administered encounter medications on file as of 05/24/2016.      Review of Systems  Constitutional:   No  weight loss, night sweats,  Fevers, chills, fatigue, or  lassitude.  HEENT:   No headaches,  Difficulty swallowing,  Tooth/dental problems, or  Sore throat,                No sneezing, itching, ear ache, nasal congestion, post nasal drip,   CV:  No chest pain,  Orthopnea, PND, swelling in lower extremities, anasarca, dizziness, palpitations, syncope.   GI  No heartburn, indigestion, abdominal pain, nausea, vomiting, diarrhea, change in bowel habits, loss of appetite, bloody stools.   Resp: No shortness of breath with exertion or at rest.  No excess mucus, no productive cough,  No non-productive cough,  No coughing up of blood.  No change in color of mucus.  No wheezing.  No chest wall deformity  Skin: no rash or lesions.  GU: no dysuria,  change in color of urine, no urgency or frequency.  No flank pain, no hematuria   MS:  No joint pain or swelling.  No decreased range of motion.  No back pain.    Physical Exam  BP 126/84 (BP Location: Left Arm, Cuff Size: Normal)   Pulse 77   Ht 6\' 3"  (1.905 m)   Wt 265 lb 9.6 oz (120.5 kg)   SpO2 97%   BMI 33.20 kg/m   GEN: A/Ox3; pleasant , NAD, well nourished    HEENT:  [place]/AT,  EACs-clear, TMs-wnl, NOSE-clear, THROAT-clear, no lesions, no postnasal drip or exudate noted. Class 2-3 MP airway   NECK:  Supple w/ fair ROM; no JVD; normal carotid impulses w/o bruits; no thyromegaly or nodules palpated; no lymphadenopathy.    RESP  Clear  P & A; w/o, wheezes/ rales/ or rhonchi. no accessory muscle use, no dullness to percussion  CARD:  RRR, no m/r/g, no peripheral edema, pulses intact, no cyanosis or clubbing.  GI:   Soft & nt; nml bowel sounds; no organomegaly or masses detected.   Musco: Warm bil, no deformities or joint swelling noted.   Neuro: alert, no focal deficits noted.    Skin: Warm, no lesions or rashes    Lab Results:  CBC  BNP No results found for: BNP  ProBNP No results found for: PROBNP  Imaging: No results found.   Assessment & Plan:   No problem-specific Assessment & Plan notes found for this encounter.     Rexene Edison, NP 05/24/2016

## 2016-05-25 NOTE — Assessment & Plan Note (Signed)
Well controlled on CPAP .   Plan  Patient Instructions  Continue on current CPAP At bedtime   Work on weight loss  Do not drive if sleepy  Follow up in 1 year with Dr. Halford Chessman  And As needed

## 2016-05-28 NOTE — Progress Notes (Signed)
I have reviewed and agree with assessment/plan.  Cecelia Graciano, MD [facility] 05/28/2016, 1:32 PM Pager:  [phone]  

## 2016-06-05 ENCOUNTER — Other Ambulatory Visit: Payer: Self-pay | Admitting: Internal Medicine

## 2016-06-22 ENCOUNTER — Encounter: Payer: BLUE CROSS/BLUE SHIELD | Admitting: Internal Medicine

## 2016-08-05 ENCOUNTER — Other Ambulatory Visit: Payer: Self-pay | Admitting: Internal Medicine

## 2016-08-14 ENCOUNTER — Other Ambulatory Visit: Payer: Self-pay | Admitting: Internal Medicine

## 2016-08-22 DIAGNOSIS — J028 Acute pharyngitis due to other specified organisms: Secondary | ICD-10-CM | POA: Diagnosis not present

## 2016-08-22 DIAGNOSIS — J029 Acute pharyngitis, unspecified: Secondary | ICD-10-CM | POA: Diagnosis not present

## 2016-08-27 ENCOUNTER — Other Ambulatory Visit: Payer: Self-pay | Admitting: Internal Medicine

## 2016-09-27 ENCOUNTER — Encounter: Payer: 59 | Admitting: Internal Medicine

## 2016-10-01 ENCOUNTER — Other Ambulatory Visit: Payer: Self-pay

## 2016-10-03 MED ORDER — ATENOLOL 50 MG PO TABS: 25.0000 mg | ORAL_TABLET | Freq: Every day | ORAL | 0 refills | Status: DC

## 2016-10-09 ENCOUNTER — Encounter: Payer: 59 | Admitting: Internal Medicine

## 2016-10-18 ENCOUNTER — Encounter: Payer: Self-pay | Admitting: Internal Medicine

## 2016-10-18 ENCOUNTER — Ambulatory Visit (INDEPENDENT_AMBULATORY_CARE_PROVIDER_SITE_OTHER): Payer: 59 | Admitting: Internal Medicine

## 2016-10-18 VITALS — BP 126/62 | HR 63 | Temp 98.0°F | Resp 14 | Ht 75.0 in | Wt 264.1 lb

## 2016-10-18 DIAGNOSIS — Z0000 Encounter for general adult medical examination without abnormal findings: Secondary | ICD-10-CM | POA: Diagnosis not present

## 2016-10-18 DIAGNOSIS — E119 Type 2 diabetes mellitus without complications: Secondary | ICD-10-CM | POA: Diagnosis not present

## 2016-10-18 LAB — MICROALBUMIN / CREATININE URINE RATIO
CREATININE, U: 73.4 mg/dL
MICROALB UR: 0.3 mg/dL (ref 0.0–1.9)
MICROALB/CREAT RATIO: 0.4 mg/g (ref 0.0–30.0)

## 2016-10-18 LAB — COMPREHENSIVE METABOLIC PANEL
ALBUMIN: 4.3 g/dL (ref 3.5–5.2)
ALK PHOS: 68 U/L (ref 39–117)
ALT: 16 U/L (ref 0–53)
AST: 12 U/L (ref 0–37)
BILIRUBIN TOTAL: 0.7 mg/dL (ref 0.2–1.2)
BUN: 22 mg/dL (ref 6–23)
CALCIUM: 9.3 mg/dL (ref 8.4–10.5)
CO2: 30 meq/L (ref 19–32)
Chloride: 103 mEq/L (ref 96–112)
Creatinine, Ser: 0.93 mg/dL (ref 0.40–1.50)
GFR: 88.36 mL/min (ref >60.00)
Glucose, Bld: 139 mg/dL — ABNORMAL HIGH (ref 70–99)
Potassium: 4.2 mEq/L (ref 3.5–5.1)
Sodium: 139 mEq/L (ref 135–145)
TOTAL PROTEIN: 6.7 g/dL (ref 6.0–8.3)

## 2016-10-18 LAB — HEMOGLOBIN A1C: HEMOGLOBIN A1C: 7.2 % — AB (ref 4.6–6.5)

## 2016-10-18 LAB — LIPID PANEL
CHOLESTEROL: 135 mg/dL (ref 0–200)
HDL: 39.2 mg/dL (ref >39.00)
LDL Cholesterol: 74 mg/dL (ref 0–99)
NonHDL: 95.41
TRIGLYCERIDES: 108 mg/dL (ref 0.0–149.0)
Total CHOL/HDL Ratio: 3
VLDL: 21.6 mg/dL (ref 0.0–40.0)

## 2016-10-18 LAB — PSA: PSA: 0.83 ng/mL (ref 0.10–4.00)

## 2016-10-18 LAB — TSH: TSH: 1.17 u[IU]/mL (ref 0.35–4.50)

## 2016-10-18 NOTE — Assessment & Plan Note (Signed)
-  Td  10-2008; zostavax 2014; pnm shot 2014; prevnar 05-2015; shingrex on back order  -+ FH Prostate cancer:   DRE (-) 05-2015, check a PSA -CCS: Had 3 Cscope , last  11-2013, due 11-2016, watch for a GI letter  -FH CAD: ASA, lipitor, control CV RF -diet  and exercise: Much improved diet lately, see history of present illness. -LABS: CMP, FLP, A1c, micral, TSH, PSA

## 2016-10-18 NOTE — Patient Instructions (Signed)
GO TO THE LAB : Get the blood work     GO TO THE FRONT DESK Schedule your next appointment for a  Check up 3 months    

## 2016-10-18 NOTE — Progress Notes (Signed)
Subjective:    Patient ID: Anthony [name], male    DOB: [birth year], 60 y.o.   MRN: [id number]  DOS:  10/18/2016 Type of visit - description : cpx Interval history:  Doing well, 2 and half weeks ago changed how  he eats, less carbohydrates, no soft drinks, has lost 8 pounds. Feeling great.    Review of Systems  Other than above, a 14 point review of systems is negative     Past Medical History:  Diagnosis Date  . Aortic root enlargement (HCC)    CT 42 mm 2014  . Chronic back pain   . Diabetes mellitus    type II  . DJD (degenerative joint disease)    per MRI of LS spine 2005  . GERD (gastroesophageal reflux disease)   . Hyperlipidemia   . Hypertension   . Hypogonadism male    dx w/ hypogonadism elsewhere ~ 2011  . Normal cardiac stress test    (-) stress test 2003, 2006  . S/P cardiac cath     04-2006 neg per pt--CP improved w/ PPIs  . Sleep apnea    uses cpap  . Tachycardia    ER 07-2012, ?SCR, on atenolol    Past Surgical History:  Procedure Laterality Date  . CARDIAC CATHETERIZATION    . CYSTECTOMY     from back  . POLYPECTOMY     Family History  Problem Relation Age of Onset  . Heart disease Father        CABG at age 42  . Colon polyps Brother   . Heart disease Brother        CABG  . Diabetes Other        cousins  . Prostate cancer Brother        age 72  . Stomach cancer Paternal Uncle   . Bladder Cancer Paternal Uncle   . Colon cancer Neg Hx   . Rectal cancer Neg Hx   . Esophageal cancer Neg Hx     Social History   Social History  . Marital status: Married    Spouse name: N/A  . Number of children: 2  . 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 Smoking status: Former Smoker    Types: Cigars    Quit date: 03/12/1981  . Smokeless tobacco: Never Used  . Alcohol use 0.0 oz/week     Comment: occassinally  . Drug use: No  . Sexual activity: Not on file   Other Topics Concern   . Not on file   Social History Narrative   Truck driver (local); lives w/ wife      Allergies as of 10/18/2016   No Known Allergies     Medication List       Accurate as of 10/18/16 11:59 PM. Always use your most recent med list.          aspirin 81 MG tablet Take 81 mg by mouth daily.   atenolol 50 MG tablet Commonly known as:  TENORMIN Take 0.5 tablets (25 mg total) by mouth daily. Call and schedule follow up office visit to receive further refills. [phone] Thank you.   atorvastatin 40 MG tablet Commonly known as:  LIPITOR Take 0.5 tablets (20 mg total) by mouth every other day.   canagliflozin 300 MG Tabs tablet Commonly known as:  INVOKANA Take 1 tablet (300 mg total) by mouth daily before breakfast.  hydrochlorothiazide 25 MG tablet Commonly known as:  HYDRODIURIL Take 1 tablet (25 mg total) by mouth daily.   losartan 25 MG tablet Commonly known as:  COZAAR Take 1 tablet (25 mg total) by mouth daily.   metFORMIN 850 MG tablet Commonly known as:  GLUCOPHAGE Take 1 tablet (850 mg total) by mouth 2 (two) times daily with a meal.   ONE TOUCH ULTRA TEST test strip Generic drug:  glucose blood CHECK BLOOD SUGAR NO MORE THAN TWICE DAILY.   pioglitazone 30 MG tablet Commonly known as:  ACTOS Take 1 tablet (30 mg total) by mouth daily.          Objective:   Physical Exam BP 126/62 (BP Location: Left Arm, Patient Position: Sitting, Cuff Size: Normal)   Pulse 63   Temp 98 F (36.7 C) (Oral)   Resp 14   Ht 6\' 3"  (1.905 m)   Wt 264 lb 2 oz (119.8 kg)   SpO2 98%   BMI 33.01 kg/m   General:   Well developed, well nourished . NAD.  Neck: No  thyromegaly  HEENT:  Normocephalic . Face symmetric, atraumatic Lungs:  CTA B Normal respiratory effort, no intercostal retractions, no accessory muscle use. Heart: RRR,  no murmur.  No pretibial edema bilaterally  Abdomen:  Not distended, soft, non-tender. No rebound or rigidity.   Skin: Exposed areas  without rash. Not pale. Not jaundice Neurologic:  alert & oriented X3.  Speech normal, gait appropriate for age and unassisted Strength symmetric and appropriate for age.  Psych: Cognition and judgment appear intact.  Cooperative with normal attention span and concentration.  Behavior appropriate. No anxious or depressed appearing.    Assessment & Plan:  Assessment  DM HTN Hyperlipidemia CV Dr [name], next visit 08-2016 --Tachycardia : ER eval 2014,   rx atenolol --stress test 2003, 2006; Chest pain-2008,  (-) cardiac cath, sx decrease with PPIs ---Aorta enlargment per CT  2014, last CT 10-2014 stable OSA , severe - on Cpap GERD DJD, Chronic back pain, MRI 2005 showed OA Hypogonadism DX 2011 + FH Prostate ca brother age 9    PLAN DM: Based A1c of 7.2 back in March 2018 he was recommended Victoza, never started, se continue w/ invokana, metformin, Actos. About 2 weeks ago he decided to improve his diet substantially and has lost 8 pounds. Will get a A1c, continue with healthy lifestyle and daily for oral medications. Praised for his change. If A1c remains elevated, will simply recheck in few months. HTN: on Tenormin, losartan, HCTZ. Recommend to watch his BP, if is less than 110, or if it is in the low side and he feels dizzy he is to let me know. May need to adjust his medication. Hyperlipidemia: Continue Lipitor, check labs RTC 3 months.   ======= DM: Extensive discussion about diet and exercise. Calorie counting encourage. Plans to get a treadmill and become more active. Haman-term options >>> ?bariatric surgery , ? Saxenda .  For now, cont w/  invokana, metformin,  Actos, Will check the A1c, AST, ALT. Foot exam negative HTN: Continue atenolol, losartan. Ambulatory BP is excellent, check a BMP Flu shot today  RTC 05-2016, CPX  Today, I spent more than 25   min with the patient: >50% of the time counseling regards diet, exercise, Breisch-term weight control options that include  bariatric surgery /saxenda

## 2016-10-18 NOTE — Progress Notes (Signed)
Pre visit review using our clinic review tool, if applicable. No additional management support is needed unless otherwise documented below in the visit note. 

## 2016-10-19 NOTE — Assessment & Plan Note (Signed)
DM: Based A1c of 7.2 back in March 2018 he was recommended Victoza, never started, se continue w/ invokana, metformin, Actos. About 2 weeks ago he decided to improve his diet substantially and has lost 8 pounds. Will get a A1c, continue with healthy lifestyle and daily for oral medications. Praised for his change. If A1c remains elevated, will simply recheck in few months. HTN: on Tenormin, losartan, HCTZ. Recommend to watch his BP, if is less than 110, or if it is in the low side and he feels dizzy he is to let me know. May need to adjust his medication. Hyperlipidemia: Continue Lipitor, check labs RTC 3 months.

## 2016-11-01 ENCOUNTER — Other Ambulatory Visit: Payer: Self-pay | Admitting: Cardiovascular Disease

## 2016-11-02 ENCOUNTER — Ambulatory Visit: Payer: 59 | Admitting: Cardiology

## 2016-11-04 ENCOUNTER — Other Ambulatory Visit: Payer: Self-pay | Admitting: Internal Medicine

## 2016-11-06 ENCOUNTER — Ambulatory Visit (INDEPENDENT_AMBULATORY_CARE_PROVIDER_SITE_OTHER): Payer: 59 | Admitting: Medical

## 2016-11-06 ENCOUNTER — Encounter: Payer: Self-pay | Admitting: Medical

## 2016-11-06 VITALS — BP 116/70 | HR 56 | Temp 98.2°F | Resp 14 | Ht 75.0 in | Wt 260.0 lb

## 2016-11-06 DIAGNOSIS — J301 Allergic rhinitis due to pollen: Secondary | ICD-10-CM

## 2016-11-06 DIAGNOSIS — H9202 Otalgia, left ear: Secondary | ICD-10-CM

## 2016-11-06 DIAGNOSIS — R05 Cough: Secondary | ICD-10-CM | POA: Diagnosis not present

## 2016-11-06 DIAGNOSIS — H6690 Otitis media, unspecified, unspecified ear: Secondary | ICD-10-CM | POA: Diagnosis not present

## 2016-11-06 DIAGNOSIS — R059 Cough, unspecified: Secondary | ICD-10-CM

## 2016-11-06 MED ORDER — NEOMYCIN-POLYMYXIN-HC 1 % OT SOLN: 3.0000 [drp] | Freq: Three times a day (TID) | OTIC | 0 refills | Status: DC

## 2016-11-06 MED ORDER — BENZONATATE 100 MG PO CAPS: 100.0000 mg | ORAL_CAPSULE | Freq: Three times a day (TID) | ORAL | 0 refills | Status: DC | PRN

## 2016-11-06 MED ORDER — FLUTICASONE PROPIONATE 50 MCG/ACT NA SUSP: 2.0000 | Freq: Every day | NASAL | 1 refills | Status: DC

## 2016-11-06 MED ORDER — AMOXICILLIN-POT CLAVULANATE 875-125 MG PO TABS: 1.0000 | ORAL_TABLET | Freq: Two times a day (BID) | ORAL | 0 refills | Status: DC

## 2016-11-06 NOTE — Patient Instructions (Addendum)
By exam and history it appears you have  early ear infection left side with some swelling of ear canal as well.  We'll prescribe Augmentin antibiotic and Cortisporin eardrops.  Also on exam swelling inside of nose/turbinates indicate probable allergy component that can be associated with sinus and ear infections. Please start Flonase nasal spray.  For recent cough we'll prescribe benzonatate. Use only at night but if your at home and not driving can use  1-2 tab  every 8 hours.  Follow-up in 7 days or as needed.  Note the pain behind-the-ear and some pain left-sided neck I think are mildly inflamed lymph nodes associated with infection. If these areas become more painful please let us know.

## 2016-11-06 NOTE — Progress Notes (Signed)
Pre visit review using our clinic review tool, if applicable. No additional management support is needed unless otherwise documented below in the visit note. 

## 2016-11-06 NOTE — Progress Notes (Signed)
Subjective:    Patient ID: Anthony [name], male    DOB: [birth year], 61 y.o.   MRN: [id number]  HPI   Pt in with 3-4 days of left ear pain. Maybe longer. No nasal congestion reported initially but later during the interview he thinks he might be mildly congested.. Pt does have mild cough intermittent. Mixed dry with occasional productive cough. No fever, no chill or sweats.  Pt has occasional ear infection over past 1.5 years. He states about 3-4 in total. He has been using cpap for 3-4 years.   Pt has history of some allergies and he thinks he is often use to feeling of nasal congestion.   Review of Systems  Constitutional: Negative for chills, fatigue and fever.  HENT: Positive for congestion, ear pain and postnasal drip. Negative for facial swelling, mouth sores, nosebleeds, sinus pain, sinus pressure, sore throat and tinnitus.   Respiratory: Negative for cough, chest tightness, shortness of breath and wheezing.   Cardiovascular: Negative for chest pain and palpitations.  Gastrointestinal: Negative for abdominal pain, blood in stool, constipation and diarrhea.  Genitourinary: Negative for dysuria and frequency.  Musculoskeletal: Negative for back pain.  Neurological: Negative for dizziness, syncope, weakness, numbness and headaches.  Hematological: Negative for adenopathy.  Psychiatric/Behavioral: Negative for behavioral problems and confusion.   Past Medical History:  Diagnosis Date  . Aortic root enlargement (HCC)    CT 42 mm 2014  . Chronic back pain   . Diabetes mellitus    type II  . DJD (degenerative joint disease)    per MRI of LS spine 2005  . GERD (gastroesophageal reflux disease)   . Hyperlipidemia   . Hypertension   . Hypogonadism male    dx w/ hypogonadism elsewhere ~ 2011  . Normal cardiac stress test    (-) stress test 2003, 2006  . S/P cardiac cath     04-2006 neg per pt--CP improved w/ PPIs  . Sleep apnea    uses cpap  . Tachycardia    ER 07-2012, ?SCR, on  atenolol     Social History   Social History  . Marital status: Married    Spouse name: N/A  . Number of children: 2  . 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 Smoking status: Former Smoker    Types: Cigars    Quit date: 03/12/1981  . Smokeless tobacco: Never Used  . Alcohol use 0.0 oz/week     Comment: occassinally  . Drug use: No  . Sexual activity: Not on file   Other Topics Concern  . Not on file   Social History Narrative   Truck driver (local); lives w/ wife    Past Surgical History:  Procedure Laterality Date  . CARDIAC CATHETERIZATION    . CYSTECTOMY     from back  . POLYPECTOMY      Family History  Problem Relation Age of Onset  . Heart disease Father        CABG at age 54  . Colon polyps Brother   . Heart disease Brother        CABG  . Diabetes Other        cousins  . Prostate cancer Brother        age 11  . Stomach cancer Paternal Uncle   . Bladder Cancer Paternal Uncle   . Colon cancer Neg Hx   . Rectal cancer  Neg Hx   . Esophageal cancer Neg Hx     No Known Allergies  Current Outpatient Prescriptions on File Prior to Visit  Medication Sig Dispense Refill  . aspirin 81 MG tablet Take 81 mg by mouth daily.      Marland Kitchen atenolol (TENORMIN) 50 MG tablet Take 0.5 tablets (25 mg total) by mouth daily. 15 tablet 0  . atorvastatin (LIPITOR) 40 MG tablet Take 0.5 tablets (20 mg total) by mouth every other day. 30 tablet 3  . canagliflozin (INVOKANA) 300 MG TABS tablet Take 1 tablet (300 mg total) by mouth daily before breakfast. 30 tablet 3  . hydrochlorothiazide (HYDRODIURIL) 25 MG tablet Take 1 tablet (25 mg total) by mouth daily. 30 tablet 5  . losartan (COZAAR) 25 MG tablet Take 1 tablet (25 mg total) by mouth daily. 30 tablet 3  . metFORMIN (GLUCOPHAGE) 850 MG tablet Take 1 tablet (850 mg total) by mouth 2 (two) times daily with a meal. 180 tablet 1  . ONE TOUCH ULTRA TEST test  strip CHECK BLOOD SUGAR NO MORE THAN TWICE DAILY. 100 each 12  . pioglitazone (ACTOS) 30 MG tablet Take 1 tablet (30 mg total) by mouth daily. 30 tablet 8   No current facility-administered medications on file prior to visit.     BP 116/70   Pulse (!) 56   Temp 98.2 F (36.8 C) (Oral)   Resp 14   Ht 6\' 3"  (1.905 m)   Wt 260 lb (117.9 kg)   SpO2 98%   BMI 32.50 kg/m       Objective:   Physical Exam  General  Mental Status - Alert. General Appearance - Well groomed. Not in acute distress.  Skin Rashes- No Rashes.  HEENT Head- Normal. Ear Auditory Canal - Left- canals Right - Normal.Tympanic Membrane- Left- mild dull pink/red. Right- Normal. Eye Sclera/Conjunctiva- Left- Normal. Right- Normal. Nose & Sinuses Nasal Mucosa- Left-  Boggy and Congested. Right-  Boggy and  Congested.Bilateral maxillary and frontal sinus pressure. Mouth & Throat Lips: Upper Lip- Normal: no dryness, cracking, pallor, cyanosis, or vesicular eruption. Lower Lip-Normal: no dryness, cracking, pallor, cyanosis or vesicular eruption. Buccal Mucosa- Bilateral- No Aphthous ulcers. Oropharynx- No Discharge or Erythema. Tonsils: Characteristics- Bilateral- No Erythema or Congestion. Size/Enlargement- Bilateral- No enlargement. Discharge- bilateral-None.  Neck Neck- Supple. No Masses. Left side neck pain behind sternocleidomatoid.  Pain below and behind ear. On auscultation of both carotids there is no bruit on either side.   Chest and Lung Exam Auscultation: Breath Sounds:-Clear even and unlabored.  Cardiovascular Auscultation:Rythm- Regular, rate and rhythm. Murmurs & Other Heart Sounds:Ausculatation of the heart reveal- No Murmurs.  Lymphatic Head & Neck General Head & Neck Lymphatics: Bilateral: Description- No Localized lymphadenopathy.(The tenderness behind and below left ear as well as behind the sternocleidomastoid may represent mildly inflamed and tender lymph nodes.)       Assessment  & Plan:  By exam and history it appears you have early ear infection left side with some swelling of ear canal as well.  We'll prescribe Augmentin antibiotic and Cortisporin eardrops.  Also on exam swelling inside of nose/turbinates indicate probable allergy component that can be associated with sinus and ear infections. Please start Flonase nasal spray.  For recent cough we'll prescribe benzonatate. Use only at night but if your at home and not driving can use  1-2 tab  every 8 hours.  Follow-up in 7 days or as needed.  Note the pain behind-the-ear and some pain  left-sided neck I think are mildly inflamed lymph nodes associated with infection. If these areas become more painful please let us know.  Charniece Venturino, Percell Miller, PA-C

## 2016-11-20 ENCOUNTER — Ambulatory Visit (INDEPENDENT_AMBULATORY_CARE_PROVIDER_SITE_OTHER): Payer: 59 | Admitting: Nurse Practitioner

## 2016-11-20 ENCOUNTER — Encounter: Payer: Self-pay | Admitting: Nurse Practitioner

## 2016-11-20 VITALS — BP 128/78 | HR 80 | Ht 75.0 in | Wt 265.4 lb

## 2016-11-20 DIAGNOSIS — I77810 Thoracic aortic ectasia: Secondary | ICD-10-CM | POA: Diagnosis not present

## 2016-11-20 DIAGNOSIS — I471 Supraventricular tachycardia: Secondary | ICD-10-CM

## 2016-11-20 DIAGNOSIS — I10 Essential (primary) hypertension: Secondary | ICD-10-CM

## 2016-11-20 DIAGNOSIS — I712 Thoracic aortic aneurysm, without rupture, unspecified: Secondary | ICD-10-CM

## 2016-11-20 DIAGNOSIS — I4719 Other supraventricular tachycardia: Secondary | ICD-10-CM

## 2016-11-20 MED ORDER — ATENOLOL 50 MG PO TABS: 25.0000 mg | ORAL_TABLET | Freq: Every day | ORAL | 3 refills | Status: DC

## 2016-11-20 NOTE — Progress Notes (Signed)
CARDIOLOGY OFFICE NOTE  Date:  11/20/2016    Anthony [name] Date of Birth: 02/03/1956 Medical Record #[id number]  PCP:  Colon Branch, MD  Cardiologist:  [name]    Chief Complaint  Patient presents with  . Hypertension  . Hyperlipidemia    Follow up visit - seen for Dr. [name]    History of Present Illness: Anthony [name] is a 61 y.o. male who presents today for a follow up visit. Seen for Dr. [name].   In 2008, he underwent a LHC by Dr. Haroldine Laws, which showed normal coronaries and normal LVF with an EF of 50%. He was also seen by Dr. Caryl Comes in 2014 for atrial arrhthymias with ? SVT and atrial couplets. He has been treated with atenolol and has done well since.  He also has a h/o aortic rood dilatation, which is followed by yearly CT scans. 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 Non obstructive CAD noted. Additional medical problems include treated HTN, HLD and DM. His lipid profile is followed by his PCP, Dr. Larose Kells.   Last seen here back in June of 2017 and was doing ok.   Comes in today. Here alone. Chronic "fluttering" in his chest - has had for many years. Overall, feels pretty good. Needs meds refilled. He has rationed his medicines - taking just a quarter of the atenolol. So far, BP is ok. No chest pain. Breathing is good. He is trying to work harder on his diet - less soda, less sweet tea. He wants off of medicines. Recent labs noted. He has no real concerns today.   Past Medical History:  Diagnosis Date  . Aortic root enlargement (HCC)    CT 42 mm 2014  . Chronic back pain   . Diabetes mellitus    type II  . DJD (degenerative joint disease)    per MRI of LS spine 2005  . GERD (gastroesophageal reflux disease)   . Hyperlipidemia   . Hypertension   . Hypogonadism male    dx w/ hypogonadism elsewhere ~ 2011  . Normal cardiac stress test    (-) stress test 2003, 2006  . S/P  cardiac cath     04-2006 neg per pt--CP improved w/ PPIs  . Sleep apnea    uses cpap  . Tachycardia    ER 07-2012, ?SCR, on atenolol    Past Surgical History:  Procedure Laterality Date  . CARDIAC CATHETERIZATION    . CYSTECTOMY     from back  . POLYPECTOMY       Medications: Current Meds  Medication Sig  . aspirin 81 MG tablet Take 81 mg by mouth daily.    Marland Kitchen atenolol (TENORMIN) 50 MG tablet Take 0.5 tablets (25 mg total) by mouth daily.  Marland Kitchen atorvastatin (LIPITOR) 40 MG tablet Take 0.5 tablets (20 mg total) by mouth every other day.  . canagliflozin (INVOKANA) 300 MG TABS tablet Take 1 tablet (300 mg total) by mouth daily before breakfast.  . hydrochlorothiazide (HYDRODIURIL) 25 MG tablet Take 1 tablet (25 mg total) by mouth daily.  Marland Kitchen losartan (COZAAR) 25 MG tablet Take 1 tablet (25 mg total) by mouth daily.  . metFORMIN (GLUCOPHAGE) 850 MG tablet Take 1 tablet (850 mg total) by mouth 2 (two) times daily with a meal.  . ONE TOUCH ULTRA TEST test strip CHECK BLOOD SUGAR NO MORE THAN  TWICE DAILY.  . [DISCONTINUED] atenolol (TENORMIN) 50 MG tablet Take 0.5 tablets (25 mg total) by mouth daily.     Allergies: No Known Allergies  Social History: The patient  reports that he quit smoking about 35 years ago. His smoking use included Cigars. He has never used smokeless tobacco. He reports that he drinks alcohol. He reports that he does not use drugs.   Family History: The patient's family history includes Bladder Cancer in his paternal uncle; Colon polyps in his brother; Diabetes in his other; Heart disease in his brother and father; Prostate cancer in his brother; Stomach cancer in his paternal uncle.   Review of Systems: Please see the history of present illness.   Otherwise, the review of systems is positive for none.   All other systems are reviewed and negative.   Physical Exam: VS:  BP 128/78   Pulse 80   Ht 6\' 3"  (1.905 m)   Wt 265 lb 6.4 oz (120.4 kg)   BMI 33.17 kg/m   .  BMI Body mass index is 33.17 kg/m.  Wt Readings from Last 3 Encounters:  11/20/16 265 lb 6.4 oz (120.4 kg)  11/06/16 260 lb (117.9 kg)  10/18/16 264 lb 2 oz (119.8 kg)    General: Pleasant. Well developed, well nourished and in no acute distress.   HEENT: Normal.  Neck: Supple, no JVD, carotid bruits, or masses noted.  Cardiac: Regular rate and rhythm. No murmurs, rubs, or gallops. No edema.  Respiratory:  Lungs are clear to auscultation bilaterally with normal work of breathing.  GI: Soft and nontender.  MS: No deformity or atrophy. Gait and ROM intact.  Skin: Warm and dry. Color is normal.  Neuro:  Strength and sensation are intact and no gross focal deficits noted.  Psych: Alert, appropriate and with normal affect.   LABORATORY DATA:  EKG:  EKG is ordered today. This demonstrates NSR.  Lab Results  Component Value Date   WBC 7.3 06/06/2015   HGB 14.3 06/06/2015   HCT 41.9 06/06/2015   PLT 286.0 06/06/2015   GLUCOSE 139 (H) 10/18/2016   CHOL 135 10/18/2016   TRIG 108.0 10/18/2016   HDL 39.20 10/18/2016   LDLCALC 74 10/18/2016   ALT 16 10/18/2016   AST 12 10/18/2016   NA 139 10/18/2016   K 4.2 10/18/2016   CL 103 10/18/2016   CREATININE 0.93 10/18/2016   BUN 22 10/18/2016   CO2 30 10/18/2016   TSH 1.17 10/18/2016   PSA 0.83 10/18/2016   INR 1.0 RATIO 05/06/2006   HGBA1C 7.2 (H) 10/18/2016   MICROALBUR 0.3 10/18/2016     BNP (last 3 results) No results for input(s): BNP in the last 8760 hours.  ProBNP (last 3 results) No results for input(s): PROBNP in the last 8760 hours.   Other Studies Reviewed Today:  Echo Study Conclusions 08/2015  - Left ventricle: The cavity size was normal. There was mild focal   basal hypertrophy of the septum. Systolic function was vigorous.   The estimated ejection fraction was in the range of 65% to 70%.   Wall motion was normal; there were no regional wall motion   abnormalities. Features are consistent with a  pseudonormal left   ventricular filling pattern, with concomitant abnormal relaxation   and increased filling pressure (grade 2 diastolic dysfunction). - Aorta: Aortic root dimension: 44 mm (ED) sinus of Valsalva. - Aortic root: The aortic root was moderately dilated. - Left atrium: The atrium was mildly dilated.  Volume/bsa, ES,   (1-plane Simpson&'s, A2C): 35.8 ml/m^2. - Right ventricle: The cavity size was mildly dilated. Wall   thickness was normal.  Impressions:  - Previous echo 2015 - aortic root 3mm. No significant change.   Assessment/Plan:  1. Aortic Root Dilatation: will get his scan updated. He has good BP control.   2. H/o Symptomatic Atrial Arrhthymias: well controlled with atenolol. He is on less medicine due to needing refill - HR in the 70's today. Atenolol refilled.   3. HTN: well controlled on current regimen.   4. HLD: followed by PCP. Well controlled on Lipitor. Recent Lipid panel noted.  5. DM: followed by PCP - he is trying to do better with diet/weight loss.  6. Obesity: he seems motivated to make changes. Discussed at length.   Current medicines are reviewed with the patient today.  The patient does not have concerns regarding medicines other than what has been noted above.  The following changes have been made:  See above.  Labs/ tests ordered today include:    Orders Placed This Encounter  Procedures  . CT ANGIO CHEST AORTA W &/OR WO CONTRAST  . EKG 12-Lead     Disposition:   FU with Dr. [name] in one year.    Patient is agreeable to this plan and will call if any problems develop in the interim.   SignedTruitt Merle, NP  11/20/2016 11:27 AM  [facility] [address] [place], [place]  [address] Phone: [phone] Fax: [phone]

## 2016-11-20 NOTE — Patient Instructions (Addendum)
We will be checking the following labs today - NONE   Medication Instructions:    Continue with your current medicines.   I refilled the Atenolol today.     Testing/Procedures To Be Arranged:  CT angio of the aorta - for follow up of dilated aortic root  Follow-Up:   See Dr. [name] in one year.     Other Special Instructions:   N/A    If you need a refill on your cardiac medications before your next appointment, please call your pharmacy.   Call the [place] office at [phone] if you have any questions, problems or concerns.

## 2016-11-23 ENCOUNTER — Inpatient Hospital Stay: Admission: RE | Admit: 2016-11-23 | Payer: 59 | Source: Ambulatory Visit

## 2016-11-23 ENCOUNTER — Ambulatory Visit (INDEPENDENT_AMBULATORY_CARE_PROVIDER_SITE_OTHER)
Admission: RE | Admit: 2016-11-23 | Discharge: 2016-11-23 | Disposition: A | Discharge status: Home / Self Care | Payer: 59 | Source: Ambulatory Visit | Attending: Nurse Practitioner | Admitting: Nurse Practitioner

## 2016-11-23 DIAGNOSIS — I712 Thoracic aortic aneurysm, without rupture, unspecified: Secondary | ICD-10-CM

## 2016-11-23 DIAGNOSIS — I77810 Thoracic aortic ectasia: Secondary | ICD-10-CM | POA: Diagnosis not present

## 2016-11-23 MED ORDER — IOPAMIDOL (ISOVUE-370) INJECTION 76%
100.0000 mL | Freq: Once | INTRAVENOUS | Status: AC | PRN
Start: 2016-11-23 — End: 2016-11-23
  Administered 2016-11-23: 100 mL via INTRAVENOUS

## 2016-11-26 ENCOUNTER — Other Ambulatory Visit: Payer: Self-pay | Admitting: Nurse Practitioner

## 2016-11-26 DIAGNOSIS — I77810 Thoracic aortic ectasia: Secondary | ICD-10-CM

## 2016-12-02 ENCOUNTER — Other Ambulatory Visit: Payer: Self-pay | Admitting: Internal Medicine

## 2016-12-07 DIAGNOSIS — E119 Type 2 diabetes mellitus without complications: Secondary | ICD-10-CM | POA: Diagnosis not present

## 2016-12-07 DIAGNOSIS — H2513 Age-related nuclear cataract, bilateral: Secondary | ICD-10-CM | POA: Diagnosis not present

## 2016-12-07 LAB — HM DIABETES EYE EXAM

## 2016-12-14 ENCOUNTER — Ambulatory Visit (INDEPENDENT_AMBULATORY_CARE_PROVIDER_SITE_OTHER): Payer: 59 | Admitting: Internal Medicine

## 2016-12-14 ENCOUNTER — Encounter: Payer: Self-pay | Admitting: Internal Medicine

## 2016-12-14 VITALS — BP 126/70 | HR 71 | Temp 97.4°F | Resp 14 | Ht 75.0 in | Wt 256.5 lb

## 2016-12-14 DIAGNOSIS — Z23 Encounter for immunization: Secondary | ICD-10-CM | POA: Diagnosis not present

## 2016-12-14 DIAGNOSIS — R361 Hematospermia: Secondary | ICD-10-CM

## 2016-12-14 LAB — POC URINALSYSI DIPSTICK (AUTOMATED)
BILIRUBIN UA: NEGATIVE
Blood, UA: NEGATIVE
KETONES UA: NEGATIVE
LEUKOCYTES UA: NEGATIVE
NITRITE UA: NEGATIVE
Protein, UA: NEGATIVE
SPEC GRAV UA: 1.02 (ref 1.010–1.025)
Urobilinogen, UA: 0.2 E.U./dL
pH, UA: 6 (ref 5.0–8.0)

## 2016-12-14 LAB — URINALYSIS, ROUTINE W REFLEX MICROSCOPIC
Bilirubin Urine: NEGATIVE
Hgb urine dipstick: NEGATIVE
KETONES UR: NEGATIVE
Leukocytes, UA: NEGATIVE
NITRITE: NEGATIVE
RBC / HPF: NONE SEEN (ref 0–?)
SPECIFIC GRAVITY, URINE: 1.015 (ref 1.000–1.030)
Total Protein, Urine: NEGATIVE
Urine Glucose: >=1000 — AB
Urobilinogen, UA: 0.2 (ref 0.0–1.0)
WBC UA: NONE SEEN (ref 0–?)
pH: 6.5 (ref 5.0–8.0)

## 2016-12-14 LAB — PSA: PSA: 0.74 ng/mL (ref 0.10–4.00)

## 2016-12-14 MED ORDER — CIPROFLOXACIN HCL 500 MG PO TABS: 500.0000 mg | ORAL_TABLET | Freq: Two times a day (BID) | ORAL | 0 refills | Status: DC

## 2016-12-14 NOTE — Patient Instructions (Signed)
GO TO THE LAB : Get the blood work     Take ciprofloxacin for 10 or 14 days. Will let you know  Avoid excessive sun exposure while on ciprofloxacin  Drink plenty of fluids  If the symptoms resurface either in few days or a few months please call the office for a referral.  Call anytime if you have fever, chills or any other worrisome symptoms

## 2016-12-14 NOTE — Progress Notes (Signed)
Pre visit review using our clinic review tool, if applicable. No additional management support is needed unless otherwise documented below in the visit note. 

## 2016-12-14 NOTE — Progress Notes (Signed)
Subjective:    Patient ID: Anthony [name], male    DOB: August 23, 1955, 61 y.o.   MRN: [id number]  DOS:  12/14/2016 Type of visit - description : Acute Interval history: Patient was sexually active 12/09/2016, the next morning saw some blood coming out of his penis. 2 days ago, he ejaculated again and this sperm was read. In both ocations he have a very mild discomfort with ejaculation. No previous episodes like these.   Review of Systems Denies fever chills No testicular pain No dysuria, gross hematuria or urinary frequency. Urine has looked normal throughout the week . He occasionally has difficulty urinating the morning but that is normal  Past Medical History:  Diagnosis Date  . Aortic root enlargement (HCC)    CT 42 mm 2014  . Chronic back pain   . Diabetes mellitus    type II  . DJD (degenerative joint disease)    per MRI of LS spine 2005  . GERD (gastroesophageal reflux disease)   . Hyperlipidemia   . Hypertension   . Hypogonadism male    dx w/ hypogonadism elsewhere ~ 2011  . Normal cardiac stress test    (-) stress test 2003, 2006  . S/P cardiac cath     04-2006 neg per pt--CP improved w/ PPIs  . Sleep apnea    uses cpap  . Tachycardia    ER 07-2012, ?SCR, on atenolol    Past Surgical History:  Procedure Laterality Date  . CARDIAC CATHETERIZATION    . CYSTECTOMY     from back  . POLYPECTOMY      Social History   Social History  . Marital status: Married    Spouse name: N/A  . Number of children: 2  . 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 Smoking status: Former Smoker    Types: Cigars    Quit date: 03/12/1981  . Smokeless tobacco: Never Used  . Alcohol use 0.0 oz/week     Comment: occassinally  . Drug use: No  . Sexual activity: Not on file   Other Topics Concern  . Not on file   Social History Narrative   Truck driver (local); lives w/ wife      Allergies as of  12/14/2016   No Known Allergies     Medication List       Accurate as of 12/14/16 11:59 PM. Always use your most recent med list.          aspirin 81 MG tablet Take 81 mg by mouth daily.   atenolol 50 MG tablet Commonly known as:  TENORMIN Take 0.5 tablets (25 mg total) by mouth daily.   atorvastatin 40 MG tablet Commonly known as:  LIPITOR Take 0.5 tablets (20 mg total) by mouth every other day.   canagliflozin 300 MG Tabs tablet Commonly known as:  INVOKANA Take 1 tablet (300 mg total) by mouth daily before breakfast.   ciprofloxacin 500 MG tablet Commonly known as:  CIPRO Take 1 tablet (500 mg total) by mouth 2 (two) times daily.   hydrochlorothiazide 25 MG tablet Commonly known as:  HYDRODIURIL Take 1 tablet (25 mg total) by mouth daily.   losartan 25 MG tablet Commonly known as:  COZAAR Take 1 tablet (25 mg total) by mouth daily.   metFORMIN 850 MG tablet Commonly known as:  GLUCOPHAGE Take 1 tablet (850 mg total) by mouth 2 (two) times  daily with a meal.   ONE TOUCH ULTRA TEST test strip Generic drug:  glucose blood CHECK BLOOD SUGAR NO MORE THAN TWICE DAILY.          Objective:   Physical Exam BP 126/70 (BP Location: Left Arm, Patient Position: Sitting, Cuff Size: Normal)   Pulse 71   Temp (!) 97.4 F (36.3 C) (Oral)   Resp 14   Ht 6\' 3"  (1.905 m)   Wt 256 lb 8 oz (116.3 kg)   SpO2 97%   BMI 32.06 kg/m  General:   Well developed, well nourished . NAD.  HEENT:  Normocephalic . Face symmetric, atraumatic GU: Scrotal contents normal, penis uncircumcised, normal without discharge, bleeding, rashes or ulcers. Digital rectal exam: Normal external examination, sphincter normal, prostate: Mildly tender, symmetric except for a question of a minute induration (2 mm) at the right side. Neurologic:  alert & oriented X3.  Speech normal, gait appropriate for age and unassisted Psych--  Cognition and judgment appear intact.  Cooperative with normal  attention span and concentration.  Behavior appropriate. No anxious or depressed appearing.      Assessment & Plan:  Assessment  DM HTN Hyperlipidemia CV Dr [name], next visit 08-2016 --Tachycardia : ER eval 2014,   rx atenolol --stress test 2003, 2006; Chest pain-2008,  (-) cardiac cath, sx decrease with PPIs ---Aorta enlargment per CT  2014, last CT 10-2014 stable OSA , severe - on Cpap GERD DJD, Chronic back pain, MRI 2005 showed OA Hypogonadism DX 2011 + FH Prostate ca brother age 64    PLAN Hematospermia: 2 episodes this week, has mild discomfort with ejaculation. Physical exam is essentially normal except for a question of a minute induration in the prostate and mild tenderness. This is most likely a benign condition, prostatitis is possible. Recommend a UA, urine culture, PSA. Will treat empirically with Cipro for 2 weeks. If PSA is normal, consider treatment for 10 days only. Call if symptoms resurface. Follow-up next month as already scheduled

## 2016-12-15 LAB — URINE CULTURE
MICRO NUMBER: 81110215
Result:: NO GROWTH
SPECIMEN QUALITY:: ADEQUATE

## 2016-12-15 NOTE — Assessment & Plan Note (Signed)
Hematospermia: 2 episodes this week, has mild discomfort with ejaculation. Physical exam is essentially normal except for a question of a minute induration in the prostate and mild tenderness. This is most likely a benign condition, prostatitis is possible. Recommend a UA, urine culture, PSA. Will treat empirically with Cipro for 2 weeks. If PSA is normal, consider treatment for 10 days only. Call if symptoms resurface. Follow-up next month as already scheduled

## 2016-12-18 ENCOUNTER — Institutional Professional Consult (permissible substitution) (INDEPENDENT_AMBULATORY_CARE_PROVIDER_SITE_OTHER): Payer: 59 | Admitting: Thoracic Surgery (Cardiothoracic Vascular Surgery)

## 2016-12-18 ENCOUNTER — Encounter: Payer: Self-pay | Admitting: Thoracic Surgery (Cardiothoracic Vascular Surgery)

## 2016-12-18 VITALS — BP 122/77 | HR 68 | Ht 75.0 in | Wt 252.0 lb

## 2016-12-18 DIAGNOSIS — Q2544 Congenital dilation of aorta: Secondary | ICD-10-CM

## 2016-12-18 DIAGNOSIS — Q2549 Other congenital malformations of aorta: Secondary | ICD-10-CM

## 2016-12-18 NOTE — Progress Notes (Signed)
PCP is Colon Branch, MD Referring Provider is Burtis Junes, NP  Chief Complaint  Patient presents with  . New Patient (Initial Visit)    dilated aoritc root 4.5, CTA chest 09/14    HPI: Anthony [name] is sent for consultation regarding a dilated aortic root  Anthony [name] is a 61 year old gentleman with a history of hypertension, type 2 diabetes, reflux, hyperlipidemia, sleep apnea, and tachycardia. He has been evaluated for shortness of breath and has had a known dilated aortic root going back to at least 2014 when it was noted on echocardiogram. A CT angiogram reported it at 42 mm. A repeat CT in 2016 showed no change. Echocardiogram in June 2017 showed normal left ventricular function, a tricuspid aortic valve with no stenosis or regurgitation, the aortic root was estimated at 44 mm. 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  Anthony [name] has chronic shortness of breath. This is been a [place]-standing problem. He had a left heart cath in 2008 which was normal. He was having atrial arrhythmias in 2014. That improved with atenolol. He continues to feel short of breath at variable times. This can happen either at rest or with exertion. He can usually walk 2-3 miles without having any shortness of breath.   Past Medical History:  Diagnosis Date  . Aortic root enlargement (HCC)    CT 42 mm 2014  . Chronic back pain   . Diabetes mellitus    type II  . DJD (degenerative joint disease)    per MRI of LS spine 2005  . GERD (gastroesophageal reflux disease)   . Hyperlipidemia   . Hypertension   . Hypogonadism male    dx w/ hypogonadism elsewhere ~ 2011  . Normal cardiac stress test    (-) stress test 2003, 2006  . S/P cardiac cath     04-2006 neg per pt--CP improved w/ PPIs  . Sleep apnea    uses cpap  . Sleep apnea   . Tachycardia    ER 07-2012, ?SCR, on atenolol    Past Surgical History:  Procedure Laterality Date  . CARDIAC  CATHETERIZATION    . CYSTECTOMY     from back  . POLYPECTOMY      Family History  Problem Relation Age of Onset  . Heart disease Father        CABG at age 60  . Colon polyps Brother   . Heart disease Brother        CABG  . Diabetes Other        cousins  . Prostate cancer Brother        age 13  . Stomach cancer Paternal Uncle   . Bladder Cancer Paternal Uncle   . Colon cancer Neg Hx   . Rectal cancer Neg Hx   . Esophageal cancer Neg Hx     Social History Social History  Substance Use Topics  . Smoking status: Former Smoker    Types: Cigars    Quit date: 03/12/1981  . Smokeless tobacco: Never Used  . Alcohol use 0.0 oz/week     Comment: occassinally    Current Outpatient Prescriptions  Medication Sig Dispense Refill  . aspirin 81 MG tablet Take 81 mg by mouth daily.      Marland Kitchen atenolol (TENORMIN) 50 MG tablet Take 0.5 tablets (25 mg total) by mouth daily. 45 tablet 3  . atorvastatin (LIPITOR) 40  MG tablet Take 0.5 tablets (20 mg total) by mouth every other day. 30 tablet 3  . canagliflozin (INVOKANA) 300 MG TABS tablet Take 1 tablet (300 mg total) by mouth daily before breakfast. 30 tablet 3  . ciprofloxacin (CIPRO) 500 MG tablet Take 1 tablet (500 mg total) by mouth 2 (two) times daily. 28 tablet 0  . hydrochlorothiazide (HYDRODIURIL) 25 MG tablet Take 1 tablet (25 mg total) by mouth daily. 30 tablet 5  . losartan (COZAAR) 25 MG tablet Take 1 tablet (25 mg total) by mouth daily. 30 tablet 3  . metFORMIN (GLUCOPHAGE) 850 MG tablet Take 1 tablet (850 mg total) by mouth 2 (two) times daily with a meal. 180 tablet 1  . ONE TOUCH ULTRA TEST test strip CHECK BLOOD SUGAR NO MORE THAN TWICE DAILY. 100 each 12   No current facility-administered medications for this visit.     No Known Allergies  Review of Systems  Constitutional: Negative for activity change, appetite change and unexpected weight change (Has lost weight with diet).  HENT: Negative for trouble swallowing and  voice change.   Eyes: Negative for visual disturbance.  Respiratory: Positive for apnea and shortness of breath. Negative for chest tightness and wheezing.   Cardiovascular: Positive for leg swelling. Negative for chest pain.  Gastrointestinal: Negative for abdominal distention and abdominal pain.  Genitourinary: Negative for difficulty urinating and dysuria.  Musculoskeletal: Negative for arthralgias and myalgias.  Neurological: Negative for syncope and weakness.  Hematological: Negative for adenopathy. Does not bruise/bleed easily.  All other systems reviewed and are negative.   BP 122/77   Pulse 68   Ht 6\' 3"  (1.905 m)   Wt 252 lb (114.3 kg)   SpO2 96%   BMI 31.50 kg/m  Physical Exam  Constitutional: He is oriented to person, place, and time. He appears well-developed and well-nourished. No distress.  HENT:  Head: Normocephalic and atraumatic.  Mouth/Throat: No oropharyngeal exudate.  Eyes: Conjunctivae and EOM are normal. No scleral icterus.  Neck: Neck supple. No thyromegaly present.  Cardiovascular: Normal rate, regular rhythm, normal heart sounds and intact distal pulses.  Exam reveals no gallop and no friction rub.   No murmur heard. Pulmonary/Chest: Effort normal and breath sounds normal. No respiratory distress. He has no wheezes. He has no rales.  Abdominal: Soft. He exhibits no distension. There is no tenderness.  Musculoskeletal: He exhibits no edema or deformity.  Lymphadenopathy:    He has no cervical adenopathy.  Neurological: He is alert and oriented to person, place, and time. No cranial nerve deficit. He exhibits normal muscle tone.  Skin: Skin is warm and dry.  Vitals reviewed.    Diagnostic Tests: CT ANGIOGRAPHY CHEST WITH CONTRAST  TECHNIQUE: Multidetector CT imaging of the chest was performed using the standard protocol during bolus administration of intravenous contrast. Multiplanar CT image reconstructions and MIPs were obtained to evaluate the  vascular anatomy.  CONTRAST:  100 cc Isovue 370 IV  COMPARISON:  11/01/2014  FINDINGS: Cardiovascular: Maximum aortic diameter is at the sinus of Valsalva measuring 4.5 cm. A ascending thoracic aortic maximum diameter 3.4 cm. Aortic large 2.7 cm. Proximal descending thoracic aorta 3.1 cm. Heart is normal size. No filling defects in the pulmonary artery is. No aortic dissection.  Mediastinum/Nodes: No mediastinal, hilar, or axillary adenopathy. Trachea and esophagus are unremarkable. Thyroid unremarkable.  Lungs/Pleura: Lungs are clear. No focal airspace opacities or suspicious nodules. No effusions.  Upper Abdomen: Imaging into the upper abdomen shows no acute findings.  Musculoskeletal: Chest wall soft tissues are unremarkable. No acute bony abnormality.  Review of the MIP images confirms the above findings.  IMPRESSION: Maximum thoracic aortic diameter at the sinuses of Valsalva, 4.5 cm. When prior study this measured in the same planes an at the same level, this is stable. Recommend semi-annual imaging followup by CTA or MRA and referral to cardiothoracic surgery if not already obtained. This recommendation follows 2010 ACCF/AHA/AATS/ACR/ASA/SCA/SCAI/SIR/STS/SVM Guidelines for the Diagnosis and Management of Patients With Thoracic Aortic Disease. Circulation. 2010; 121: L572-I203  No acute or significant extracardiac abnormality.   Electronically Signed   By: Rolm Baptise M.D.   On: 11/23/2016 09:12 I personally reviewed the CT images and concur with the findings noted above. Also compared it to his CT angiograms from 2016 in 2014. I see no appreciable change. One measured side-by-side there may have been 0.5-1 mm change in the 4 year interval between his first scan and the most recent.  Impression: Anthony [name] is a 61 year old gentleman with a past medical history significant for hypertension who was being evaluated for shortness of breath. 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 This is been present since at least 2016 and is unchanged. His most recent echocardiogram showed a dilated aortic root with no aortic insufficiency in June 2017.  I explained to Mr. and Mrs. Abt that there is no indication for surgery at the present time. I suspect that he is always had a large aortic root given that he does not have any aortic insufficiency. The sinuses of Valsalva are normally larger compared to the remainder of the ascending aorta. Indications for surgery would be a diameter of 5.5-6 cm or an increase of greater than 5 mm in 6 months.  Hypertension- the treatment for the aneurysm of short of surgical reconstruction and indicated his blood pressure control. I have says the importance of diet, exercise, weight loss and a pressure medications for optimizing his blood pressure.  Plan: Return in 6 months with an MR angiogram  Melrose Nakayama, MD [facility] [phone]

## 2016-12-24 ENCOUNTER — Encounter: Payer: Self-pay | Admitting: Gastroenterology

## 2017-01-16 ENCOUNTER — Ambulatory Visit: Payer: 59 | Admitting: Internal Medicine

## 2017-01-16 ENCOUNTER — Encounter: Payer: Self-pay | Admitting: Internal Medicine

## 2017-01-16 VITALS — BP 122/72 | HR 62 | Temp 98.1°F | Resp 14 | Ht 75.0 in | Wt 257.2 lb

## 2017-01-16 DIAGNOSIS — I10 Essential (primary) hypertension: Secondary | ICD-10-CM

## 2017-01-16 DIAGNOSIS — F419 Anxiety disorder, unspecified: Secondary | ICD-10-CM

## 2017-01-16 DIAGNOSIS — R361 Hematospermia: Secondary | ICD-10-CM | POA: Diagnosis not present

## 2017-01-16 DIAGNOSIS — E119 Type 2 diabetes mellitus without complications: Secondary | ICD-10-CM | POA: Diagnosis not present

## 2017-01-16 LAB — CBC WITH DIFFERENTIAL/PLATELET
BASOS PCT: 0.8 % (ref 0.0–3.0)
Basophils Absolute: 0.1 10*3/uL (ref 0.0–0.1)
EOS ABS: 0.1 10*3/uL (ref 0.0–0.7)
EOS PCT: 1.5 % (ref 0.0–5.0)
HCT: 42.3 % (ref 39.0–52.0)
HEMOGLOBIN: 14.4 g/dL (ref 13.0–17.0)
Lymphocytes Relative: 16.7 % (ref 12.0–46.0)
Lymphs Abs: 1.2 10*3/uL (ref 0.7–4.0)
MCHC: 34 g/dL (ref 30.0–36.0)
MCV: 91.9 fl (ref 78.0–100.0)
MONO ABS: 0.6 10*3/uL (ref 0.1–1.0)
Monocytes Relative: 8.7 % (ref 3.0–12.0)
NEUTROS ABS: 5.3 10*3/uL (ref 1.4–7.7)
Neutrophils Relative %: 72.3 % (ref 43.0–77.0)
PLATELETS: 269 10*3/uL (ref 150.0–400.0)
RBC: 4.6 Mil/uL (ref 4.22–5.81)
RDW: 12.6 % (ref 11.5–15.5)
WBC: 7.3 10*3/uL (ref 4.0–10.5)

## 2017-01-16 LAB — HEMOGLOBIN A1C: Hgb A1c MFr Bld: 7.2 % — ABNORMAL HIGH (ref 4.6–6.5)

## 2017-01-16 LAB — BASIC METABOLIC PANEL
BUN: 21 mg/dL (ref 6–23)
CHLORIDE: 103 meq/L (ref 96–112)
CO2: 28 meq/L (ref 19–32)
CREATININE: 0.86 mg/dL (ref 0.40–1.50)
Calcium: 9.4 mg/dL (ref 8.4–10.5)
GFR: 96.63 mL/min (ref >60.00)
Glucose, Bld: 152 mg/dL — ABNORMAL HIGH (ref 70–99)
POTASSIUM: 3.8 meq/L (ref 3.5–5.1)
Sodium: 139 mEq/L (ref 135–145)

## 2017-01-16 MED ORDER — PIOGLITAZONE HCL 30 MG PO TABS: 30.0000 mg | ORAL_TABLET | Freq: Every day | ORAL | 6 refills | Status: DC

## 2017-01-16 NOTE — Patient Instructions (Signed)
GO TO THE LAB : Get the blood work     GO TO THE FRONT DESK Schedule your next appointment for a  Check up 4 months   Restart ACTOS (pioglitazone)

## 2017-01-16 NOTE — Progress Notes (Signed)
Subjective:    Patient ID: Anthony [name], male    DOB: [birth year], 61 y.o.   MRN: [id number]  DOS:  01/16/2017 Type of visit - description : rov Interval history: Hematospermia: See last visit, had antibiotics, no more symptoms Thoracic aortic aneurysm: Note from cardiovascular surgery reviewed DM: Review his blood sugar readings.  Diet has not been the best, he has been very stressed. HTN: Good medication compliance, ambulatory BPs 117, 093 with a diastolic of 80. Anxiety: The last few weeks has been very stressful because he had hematospermia and also his aortic dilatation is now slightly larger.  After he talk with surgery he felt better because he was recommended observation.  Review of Systems No fever chills No chest pain or difficulty breathing No nausea, vomiting, diarrhea No lower extremity paresthesias  Past Medical History:  Diagnosis Date  . Aortic root enlargement (HCC)    CT 42 mm 2014  . Chronic back pain   . Diabetes mellitus    type II  . DJD (degenerative joint disease)    per MRI of LS spine 2005  . GERD (gastroesophageal reflux disease)   . Hyperlipidemia   . Hypertension   . Hypogonadism male    dx w/ hypogonadism elsewhere ~ 2011  . Normal cardiac stress test    (-) stress test 2003, 2006  . S/P cardiac cath     04-2006 neg per pt--CP improved w/ PPIs  . Sleep apnea    uses cpap  . Sleep apnea   . Tachycardia    ER 07-2012, ?SCR, on atenolol    Past Surgical History:  Procedure Laterality Date  . CARDIAC CATHETERIZATION    . CYSTECTOMY     from back  . POLYPECTOMY      Social History   Socioeconomic History  . Marital status: Married    Spouse name: Not on file  . Number of children: 2  . Years of education: Not on file  . Highest education level: Not on file  Social Needs  . Financial resource strain: Not on file  . Food insecurity - worry: Not on file  . Food insecurity - inability: Not on file  . Transportation needs - medical: Not  on file  . Transportation needs - non-medical: Not on file  Occupational History  . Occupation: TRUCK DRIVER    Employer: [employer]  Tobacco Use  . Smoking status: Former Smoker    Types: Cigars    Last attempt to quit: 03/12/1981    Years since quitting: 35.8  . Smokeless tobacco: Never Used  Substance and Sexual Activity  . Alcohol use: Yes    Alcohol/week: 0.0 oz    Comment: occassinally  . Drug use: No  . Sexual activity: Not on file  Other Topics Concern  . Not on file  Social History Narrative   Truck driver (local); lives w/ wife      Allergies as of 01/16/2017   No Known Allergies     Medication List        Accurate as of 01/16/17  5:03 PM. Always use your most recent med list.          aspirin 81 MG tablet Take 81 mg by mouth daily.   atenolol 50 MG tablet Commonly known as:  TENORMIN Take 0.5 tablets (25 mg total) by mouth daily.   atorvastatin 40 MG tablet Commonly known as:  LIPITOR Take 0.5 tablets (20 mg total) by mouth every other day.  canagliflozin 300 MG Tabs tablet Commonly known as:  INVOKANA Take 1 tablet (300 mg total) by mouth daily before breakfast.   hydrochlorothiazide 25 MG tablet Commonly known as:  HYDRODIURIL Take 1 tablet (25 mg total) by mouth daily.   losartan 25 MG tablet Commonly known as:  COZAAR Take 1 tablet (25 mg total) by mouth daily.   metFORMIN 850 MG tablet Commonly known as:  GLUCOPHAGE Take 1 tablet (850 mg total) by mouth 2 (two) times daily with a meal.   ONE TOUCH ULTRA TEST test strip Generic drug:  glucose blood CHECK BLOOD SUGAR NO MORE THAN TWICE DAILY.   pioglitazone 30 MG tablet Commonly known as:  ACTOS Take 1 tablet (30 mg total) daily by mouth.          Objective:   Physical Exam BP 122/72 (BP Location: Left Arm, Patient Position: Sitting, Cuff Size: Normal)   Pulse 62   Temp 98.1 F (36.7 C) (Oral)   Resp 14   Ht 6\' 3"  (1.905 m)   Wt 257 lb 4 oz (116.7 kg)   SpO2 98%    BMI 32.15 kg/m  General:   Well developed, well nourished . NAD.  HEENT:  Normocephalic . Face symmetric, atraumatic Lungs:  CTA B Normal respiratory effort, no intercostal retractions, no accessory muscle use. Heart: RRR,  no murmur.  No pretibial edema bilaterally  Skin: Not pale. Not jaundice DIABETIC FEET EXAM: No lower extremity edema Normal pedal pulses bilaterally Skin normal, nails normal, no calluses Pinprick examination of the feet normal. Neurologic:  alert & oriented X3.  Speech normal, gait appropriate for age and unassisted Psych--  Cognition and judgment appear intact.  Cooperative with normal attention span and concentration.  Behavior appropriate. No anxious or depressed appearing.      Assessment & Plan:   Assessment  DM HTN Hyperlipidemia CV Dr [name], next visit 08-2016 --Tachycardia : ER eval 2014,   rx atenolol --stress test 2003, 2006; Chest pain-2008,  (-) cardiac cath, sx decrease with PPIs ---Aorta enlargment per CT  2014, saw surgery 12-2016, they will cont observation OSA , severe - on Cpap GERD DJD, Chronic back pain, MRI 2005 showed OA Hypogonadism DX 2011 + FH Prostate ca brother age 6    P LAN Hematospermia: Urinalysis and PSA were normal, had antibiotics, now asx, rec observation DM: Currently on Invokana and metformin.  For some reason he stopped Actos several months ago.  Diet has not been the best lately. Feet exam (-). CBG fasting average 158, nonfasting range from 130-150. Plan: A1c, restart Actos, watch diet.  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 HTN: Check a BMP and CBC, continue with Tenormin, losartan, HCTZ Aortic enlargement/dilatation: f/u by surgery, note reviewed. Anxiety: Regards hematospermia and aortic di enlargement, patient is counseled. RTC 4 months

## 2017-01-16 NOTE — Assessment & Plan Note (Signed)
Hematospermia: Urinalysis and PSA were normal, had antibiotics, now asx, rec observation DM: Currently on Invokana and metformin.  For some reason he stopped Actos several months ago.  Diet has not been the best lately. Feet exam (-). CBG fasting average 158, nonfasting range from 130-150. Plan: A1c, restart Actos, watch diet.  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 HTN: Check a BMP and CBC, continue with Tenormin, losartan, HCTZ Aortic enlargement/dilatation: f/u by surgery, note reviewed. Anxiety: Regards hematospermia and aortic di enlargement, patient is counseled. RTC 4 months

## 2017-01-16 NOTE — Progress Notes (Signed)
Pre visit review using our clinic review tool, if applicable. No additional management support is needed unless otherwise documented below in the visit note. 

## 2017-01-18 ENCOUNTER — Encounter: Payer: Self-pay | Admitting: Internal Medicine

## 2017-01-28 ENCOUNTER — Encounter: Payer: Self-pay | Admitting: Internal Medicine

## 2017-02-15 ENCOUNTER — Other Ambulatory Visit: Payer: Self-pay

## 2017-02-15 ENCOUNTER — Ambulatory Visit (AMBULATORY_SURGERY_CENTER): Payer: Self-pay | Admitting: *Deleted

## 2017-02-15 VITALS — Ht 75.0 in | Wt 259.0 lb

## 2017-02-15 DIAGNOSIS — Z86010 Personal history of colonic polyps: Secondary | ICD-10-CM

## 2017-02-15 MED ORDER — NA SULFATE-K SULFATE-MG SULF 17.5-3.13-1.6 GM/177ML PO SOLN: 1.0000 | Freq: Once | ORAL | 0 refills | Status: AC

## 2017-02-15 NOTE — Progress Notes (Signed)
No egg or soy allergy known to patient  No issues with past sedation with any surgeries  or procedures, no intubation problems  No diet pills per patient No home 02 use per patient  No blood thinners per patient  Pt denies issues with constipation but states he will get constipated then have diarrhea - uses benefiber prn, not daily  No A fib or A flutter  EMMI video sent to pt's e mail - pt declined

## 2017-02-18 ENCOUNTER — Encounter: Payer: Self-pay | Admitting: Gastroenterology

## 2017-02-21 ENCOUNTER — Other Ambulatory Visit: Payer: Self-pay | Admitting: Internal Medicine

## 2017-02-25 ENCOUNTER — Telehealth: Payer: Self-pay | Admitting: Gastroenterology

## 2017-02-25 NOTE — Telephone Encounter (Signed)
Pt states su[prep is 117 $ - too expensive- [facility]- pharmacist states suprep 117, movi and plenvu are more expensive than this - pt not willing to pay- offered colytely- pt dewcliend due to amount- gave plenvu sample-   I kit  Lot [id number]  Exp 08/2018. Pt to pick up with new instructions 4th floor Tuesday 12-18- went over how to use but encouraged pt to call if has questions or can ask a [facility] nurse Tuesday   [facility]

## 2017-03-01 ENCOUNTER — Ambulatory Visit (AMBULATORY_SURGERY_CENTER): Payer: 59 | Admitting: Gastroenterology

## 2017-03-01 ENCOUNTER — Other Ambulatory Visit: Payer: Self-pay

## 2017-03-01 ENCOUNTER — Encounter: Payer: Self-pay | Admitting: Gastroenterology

## 2017-03-01 VITALS — BP 116/71 | HR 54 | Temp 97.1°F | Resp 17 | Ht 75.0 in | Wt 259.0 lb

## 2017-03-01 DIAGNOSIS — D123 Benign neoplasm of transverse colon: Secondary | ICD-10-CM

## 2017-03-01 DIAGNOSIS — D120 Benign neoplasm of cecum: Secondary | ICD-10-CM

## 2017-03-01 DIAGNOSIS — Z86010 Personal history of colonic polyps: Secondary | ICD-10-CM | POA: Diagnosis present

## 2017-03-01 DIAGNOSIS — I10 Essential (primary) hypertension: Secondary | ICD-10-CM | POA: Diagnosis not present

## 2017-03-01 DIAGNOSIS — G4733 Obstructive sleep apnea (adult) (pediatric): Secondary | ICD-10-CM | POA: Diagnosis not present

## 2017-03-01 MED ORDER — SODIUM CHLORIDE 0.9 % IV SOLN: 500.0000 mL | Freq: Once | INTRAVENOUS | Status: DC

## 2017-03-01 NOTE — Progress Notes (Signed)
No changes in medical or surgical hx since PV per pt 

## 2017-03-01 NOTE — Patient Instructions (Signed)
YOU HAD AN ENDOSCOPIC PROCEDURE TODAY AT THE [facility]:   Refer to the procedure report that was given to you for any specific questions about what was found during the examination.  If the procedure report does not answer your questions, please call your gastroenterologist to clarify.  If you requested that your care partner not be given the details of your procedure findings, then the procedure report has been included in a sealed envelope for you to review at your convenience later.  YOU SHOULD EXPECT: Some feelings of bloating in the abdomen. Passage of more gas than usual.  Walking can help get rid of the air that was put into your GI tract during the procedure and reduce the bloating. If you had a lower endoscopy (such as a colonoscopy or flexible sigmoidoscopy) you may notice spotting of blood in your stool or on the toilet paper. If you underwent a bowel prep for your procedure, you may not have a normal bowel movement for a few days.  Please Note:  You might notice some irritation and congestion in your nose or some drainage.  This is from the oxygen used during your procedure.  There is no need for concern and it should clear up in a day or so.  SYMPTOMS TO REPORT IMMEDIATELY:   Following lower endoscopy (colonoscopy or flexible sigmoidoscopy):  Excessive amounts of blood in the stool  Significant tenderness or worsening of abdominal pains  Swelling of the abdomen that is new, acute  Fever of 100F or higher   For urgent or emergent issues, a gastroenterologist can be reached at any hour by calling [phone].   DIET:  We do recommend a small meal at first, but then you may proceed to your regular diet.  Drink plenty of fluids but you should avoid alcoholic beverages for 24 hours.  ACTIVITY:  You should plan to take it easy for the rest of today and you should NOT DRIVE or use heavy machinery until tomorrow (because of the sedation medicines used during the test).     FOLLOW UP: Our staff will call the number listed on your records the next business day following your procedure to check on you and address any questions or concerns that you may have regarding the information given to you following your procedure. If we do not reach you, we will leave a message.  However, if you are feeling well and you are not experiencing any problems, there is no need to return our call.  We will assume that you have returned to your regular daily activities without incident.  If any biopsies were taken you will be contacted by phone or by letter within the next 1-3 weeks.  Please call us at [phone] if you have not heard about the biopsies in 3 weeks.    SIGNATURES/CONFIDENTIALITY: You and/or your care partner have signed paperwork which will be entered into your electronic medical record.  These signatures attest to the fact that that the information above on your After Visit Summary has been reviewed and is understood.  Full responsibility of the confidentiality of this discharge information lies with you and/or your care-partner.  Read all of the handouts given to you by your recovery room nurse. 

## 2017-03-01 NOTE — Progress Notes (Signed)
To recovery, report to RN, VSS. 

## 2017-03-01 NOTE — Progress Notes (Signed)
Called to room to assist during endoscopic procedure.  Patient ID and intended procedure confirmed with present staff. Received instructions for my participation in the procedure from the performing physician.  

## 2017-03-01 NOTE — Op Note (Signed)
[facility] Patient Name: Anthony [name] Procedure Date: 03/01/2017 8:06 AM MRN: [id number] Endoscopist: Ladene Artist , MD Age: 61 Referring MD:  Date of Birth: [birth year] Gender: Male Account #: [id number] Procedure:                Colonoscopy Indications:              Surveillance: Personal history of adenomatous                            polyps on last colonoscopy 3 years ago Medicines:                Monitored Anesthesia Care Procedure:                Pre-Anesthesia Assessment:                           - Prior to the procedure, a History and Physical                            was performed, and patient medications and                            allergies were reviewed. The patient's tolerance of                            previous anesthesia was also reviewed. The risks                            and benefits of the procedure and the sedation                            options and risks were discussed with the patient.                            All questions were answered, and informed consent                            was obtained. Prior Anticoagulants: The patient has                            taken no previous anticoagulant or antiplatelet                            agents. ASA Grade Assessment: II - A patient with                            mild systemic disease. After reviewing the risks                            and benefits, the patient was deemed in                            satisfactory condition to undergo the procedure.  After obtaining informed consent, the colonoscope                            was passed under direct vision. Throughout the                            procedure, the patient's blood pressure, pulse, and                            oxygen saturations were monitored continuously. The                            Colonoscope was introduced through the anus and                            advanced to the the cecum,  identified by                            appendiceal orifice and ileocecal valve. The                            ileocecal valve, appendiceal orifice, and rectum                            were photographed. The quality of the bowel                            preparation was good. The colonoscopy was performed                            without difficulty. The patient tolerated the                            procedure well. Scope In: 8:09:34 AM Scope Out: 8:33:16 AM Scope Withdrawal Time: 0 hours 17 minutes 39 seconds  Total Procedure Duration: 0 hours 23 minutes 42 seconds  Findings:                 The perianal and digital rectal examinations were                            normal.                           A tattoo was seen at the hepatic flexure. The                            tattoo site appeared normal.                           Two sessile polyps were found in the hepatic                            flexure and ileocecal valve. The polyps were 10 mm  in size. These polyps were removed with a hot                            snare. Resection and retrieval were complete.                           Two sessile polyps were found in the transverse                            colon and hepatic flexure. The polyps were 6 to 7                            mm in size. These polyps were removed with a cold                            snare. Resection and retrieval were complete.                           Internal hemorrhoids were found during                            retroflexion. The hemorrhoids were small and Grade                            I (internal hemorrhoids that do not prolapse).                           The exam was otherwise without abnormality on                            direct and retroflexion views. Complications:            No immediate complications. Estimated blood loss:                            None. Estimated Blood Loss:     Estimated blood  loss: none. Impression:               - A tattoo was seen at the hepatic flexure. The                            tattoo site appeared normal.                           - Two 10 mm polyps at the hepatic flexure and at                            the ileocecal valve, removed with a hot snare.                            Resected and retrieved.                           - Two 6 to 7 mm polyps in the transverse colon and  at the hepatic flexure, removed with a cold snare.                            Resected and retrieved.                           - Internal hemorrhoids.                           - The examination was otherwise normal on direct                            and retroflexion views. Recommendation:           - Repeat colonoscopy in 3 years for surveillance.                           - Patient has a contact number available for                            emergencies. The signs and symptoms of potential                            delayed complications were discussed with the                            patient. Return to normal activities tomorrow.                            Written discharge instructions were provided to the                            patient.                           - Resume previous diet.                           - Continue present medications.                           - Await pathology results.                           - No aspirin, ibuprofen, naproxen, or other                            non-steroidal anti-inflammatory drugs for 2 weeks                            after polyp removal. Ladene Artist, MD 03/01/2017 8:37:54 AM This report has been signed electronically.

## 2017-03-04 ENCOUNTER — Telehealth: Payer: Self-pay | Admitting: *Deleted

## 2017-03-04 NOTE — Telephone Encounter (Signed)
No answer, left message to call if questions or concerns. 

## 2017-03-04 NOTE — Telephone Encounter (Signed)
No answer, left message to call if questions or concerns.  Second call.

## 2017-03-04 NOTE — Telephone Encounter (Signed)
Patient called back and states he is doing great.

## 2017-03-09 ENCOUNTER — Encounter: Payer: Self-pay | Admitting: Gastroenterology

## 2017-04-07 ENCOUNTER — Other Ambulatory Visit: Payer: Self-pay | Admitting: Internal Medicine

## 2017-04-30 ENCOUNTER — Telehealth: Payer: Self-pay

## 2017-04-30 ENCOUNTER — Telehealth: Payer: Self-pay | Admitting: Cardiovascular Disease

## 2017-04-30 NOTE — Telephone Encounter (Signed)
Patient needs clearance for DOT. Will forward to Dr. [name] to see if letter can be completed.

## 2017-04-30 NOTE — Telephone Encounter (Signed)
-----   Message from Trinity sent at 04/30/2017  9:33 AM EST ----- Regarding: Clearance Letter/DOT Contact: [phone] Hi Pam,   Patient called in stating he needs a Letter Of Clearance for DOT.  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 He will come pick up once completed.   Pam- Patient called back with more Info of where to fax [facility]ance [facility] [address] [place] [address]  [phone] Thanks, Maudie Mercury

## 2017-04-30 NOTE — Telephone Encounter (Signed)
Letter has been written. Will send back to medical records.

## 2017-04-30 NOTE — Telephone Encounter (Signed)
Clearance letter faxed to  [facility] [address] [place] [address] [phone] Also left patient message to call me back he requested to pick up copy of clearance letter I have this ready for him.

## 2017-04-30 NOTE — Telephone Encounter (Signed)
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

## 2017-05-18 ENCOUNTER — Other Ambulatory Visit: Payer: Self-pay | Admitting: Internal Medicine

## 2017-05-28 ENCOUNTER — Other Ambulatory Visit: Payer: Self-pay | Admitting: *Deleted

## 2017-05-28 DIAGNOSIS — I712 Thoracic aortic aneurysm, without rupture, unspecified: Secondary | ICD-10-CM

## 2017-05-29 ENCOUNTER — Ambulatory Visit: Payer: 59 | Admitting: Internal Medicine

## 2017-06-18 ENCOUNTER — Encounter: Payer: 59 | Admitting: Thoracic Surgery (Cardiothoracic Vascular Surgery)

## 2017-06-21 ENCOUNTER — Ambulatory Visit
Admission: RE | Admit: 2017-06-21 | Discharge: 2017-06-21 | Disposition: A | Discharge status: Home / Self Care | Payer: 59 | Source: Ambulatory Visit | Attending: Thoracic Surgery (Cardiothoracic Vascular Surgery) | Admitting: Thoracic Surgery (Cardiothoracic Vascular Surgery)

## 2017-06-21 DIAGNOSIS — I712 Thoracic aortic aneurysm, without rupture, unspecified: Secondary | ICD-10-CM

## 2017-06-21 MED ORDER — GADOBENATE DIMEGLUMINE 529 MG/ML IV SOLN
20.0000 mL | Freq: Once | INTRAVENOUS | Status: AC | PRN
  Administered 2017-06-21: 20 mL via INTRAVENOUS

## 2017-06-25 ENCOUNTER — Ambulatory Visit: Payer: 59 | Admitting: Thoracic Surgery (Cardiothoracic Vascular Surgery)

## 2017-06-25 ENCOUNTER — Other Ambulatory Visit: Payer: Self-pay

## 2017-06-25 ENCOUNTER — Encounter: Payer: Self-pay | Admitting: Thoracic Surgery (Cardiothoracic Vascular Surgery)

## 2017-06-25 VITALS — BP 122/81 | HR 59 | Resp 16 | Ht 75.0 in | Wt 272.2 lb

## 2017-06-25 DIAGNOSIS — I712 Thoracic aortic aneurysm, without rupture, unspecified: Secondary | ICD-10-CM

## 2017-06-25 DIAGNOSIS — Q2544 Congenital dilation of aorta: Secondary | ICD-10-CM | POA: Diagnosis not present

## 2017-06-25 DIAGNOSIS — Q2549 Other congenital malformations of aorta: Secondary | ICD-10-CM

## 2017-06-25 NOTE — Progress Notes (Signed)
[address]       [place] [address]             [phone]       HPI: [name] returns for a scheduled follow-up  [name] is a 62 year old man with a past history of hypertension, hyperlipidemia, reflux, type 2 diabetes, sleep apnea, and tachycardia.  He was first noted to have a dilated aortic root back in 2014.  It was noted on echocardiogram at that time.  A CT angiogram reported at 42 mm.  He has been followed with echocardiograms and CTs since then.  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  His chronic shortness of breath is unchanged.  He sometimes experiences pain in his chest if he exerts himself within a couple of hours of eating.  This is up across the shoulders not substernal.  He can walk 2-3 miles without difficulty.  He says his blood pressure has been well controlled.  Past Medical History:  Diagnosis Date  . Allergy   . Aortic root enlargement (HCC)    CT 42 mm 2014  . Chronic back pain   . Diabetes mellitus    type II  . DJD (degenerative joint disease)    per MRI of LS spine 2005  . GERD (gastroesophageal reflux disease)   . Hyperlipidemia   . Hypertension   . Hypogonadism male    dx w/ hypogonadism elsewhere ~ 2011  . Normal cardiac stress test    (-) stress test 2003, 2006  . S/P cardiac cath     04-2006 neg per pt--CP improved w/ PPIs  . Sleep apnea    uses cpap  . Sleep apnea   . Tachycardia    ER 07-2012, ?SCR, on atenolol    Current Outpatient Medications  Medication Sig Dispense Refill  . aspirin 81 MG tablet Take 81 mg by mouth daily.      Marland Kitchen atenolol (TENORMIN) 50 MG tablet Take 0.5 tablets (25 mg total) by mouth daily. 45 tablet 3  . atorvastatin (LIPITOR) 40 MG tablet Take 0.5 tablets (20 mg total) by mouth every other day. 30 tablet 3  . canagliflozin (INVOKANA) 300 MG TABS tablet Take 1 tablet (300 mg total) by mouth daily before breakfast. 30 tablet 3  .  hydrochlorothiazide (HYDRODIURIL) 25 MG tablet Take 1 tablet (25 mg total) by mouth daily. 30 tablet 5  . losartan (COZAAR) 25 MG tablet Take 1 tablet (25 mg total) by mouth daily. 30 tablet 3  . metFORMIN (GLUCOPHAGE) 850 MG tablet Take 1 tablet (850 mg total) by mouth 2 (two) times daily with a meal. 180 tablet 0  . ONE TOUCH ULTRA TEST test strip CHECK BLOOD SUGAR NO MORE THAN TWICE DAILY. 100 each 12  . pioglitazone (ACTOS) 30 MG tablet Take 1 tablet (30 mg total) daily by mouth. 30 tablet 6   Current Facility-Administered Medications  Medication Dose Route Frequency Provider Last Rate Last Dose  . 0.9 %  sodium chloride infusion  500 mL Intravenous Once Ladene Artist, MD        Physical Exam BP 122/81 (BP Location: Right Arm, Patient Position: Sitting, Cuff Size: Large)   Pulse (!) 59   Resp 16   Ht 6\' 3"  (1.905 m)   Wt 272 lb 3.2 oz (123.5 kg)   SpO2 98% Comment: ON RA  BMI 34.02 kg/m  62 year old man in no acute distress Alert and oriented x3 with no focal deficits Lungs clear breath sounds bilaterally Cardiac regular rate and rhythm, no murmur No peripheral edema  Diagnostic Tests: MRA CHEST WITH OR WITHOUT CONTRAST  TECHNIQUE: Angiographic images of the chest were obtained using MRA technique with intravenous contrast.  CONTRAST:  88mL MULTIHANCE GADOBENATE DIMEGLUMINE 529 MG/ML IV SOLN  Creatinine was obtained on site at [facility] at [address]  Results: Creatinine 0.8 mg/dL.  COMPARISON:  Chest CT - 11/23/2016; 11/01/2014  FINDINGS: Vascular Findings:  No evidence of thoracic aortic aneurysm with measurements as follows. The aortic root is suboptimally evaluated due to pulsation artifact.  Conventional configuration of the aortic arch. The branch vessels of the aortic arch appear widely patent throughout their imaged course.  The descending thoracic aorta is tortuous but of normal caliber.  Normal heart size. No  pericardial effusion. Although this examination was not tailored for the evaluation the pulmonary arteries, there are no discrete filling defects within the central pulmonary arterial tree to suggest central pulmonary embolism. Normal caliber of the main pulmonary artery.  -------------------------------------------------------------  Thoracic aortic measurements:  Aortic root at the level of the sinuses of Valsalva:  47 mm as measured in greatest oblique short axis axial diameter (image 28, series 19), suboptimally evaluated on other sequences due to pulsation artifact, grossly unchanged in hind site compared to the 10/2014 examination by my direct comparison measurement.  Sinotubular junction  36 mm as measured in greatest oblique coronal dimension.  Proximal ascending aorta  35 mm as measured in greatest oblique axial dimension at the level of the main pulmonary artery (image 21, series 19) and approximately 32 mm in greatest oblique sagittal diameter (image 29, series 14).  Aortic arch aorta  20 mm as measured in greatest oblique sagittal dimension.  Proximal descending thoracic aorta  28 mm as measured in greatest oblique axial dimension at the level of the main pulmonary artery.  Distal descending thoracic aorta  26 mm as measured in greatest oblique sagittal dimension at the level of the diaphragmatic hiatus (image 20, series 14).  Review of the MIP images confirms the above findings.  -------------------------------------------------------------  Non-Vascular Findings:  Mediastinum/Lymph Nodes: No bulky mediastinal, hilar or axillary lymphadenopathy.  Lungs/Pleura: No focal airspace opacities.  No pleural effusion.  Upper abdomen: Limited evaluation of the no definite acute upper abdomen is unremarkable.  Musculoskeletal: No definite acute or aggressive osseous abnormalities  IMPRESSION: 1. No evidence of thoracic aortic  aneurysm. 2. Grossly unchanged suspected mild dilatation of the root of the aorta measuring approximately 47 mm in diameter at the level of the sinuses of Valsalva unchanged in hind site compared to the 10/2014 examination. 3. Unchanged tortuous but normal caliber descending thoracic aorta.   Electronically Signed   By: Sandi Mariscal M.D.   On: 06/21/2017 14:45 Personally reviewed the MR angios and concur with the findings noted above.  The sinuses of Valsalva are not dilated, but unchanged despite different numbers being reported.  Impression:  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  The remainder of his thoracic aorta is tortuous but of normal diameter.  This is been unchanged over serial studies over the past several years.  He needs continued follow-up at 95-month intervals.  We will schedule him for an MR angiogram at that time.  Hypertension-blood pressure well controlled on current regimen.  Hyperlipidemia-on Lipitor  Type 2 diabetes-on Invokana,  metformin, and Actos  Plan: Return in 6 months with MR angiogram of chest  Melrose Nakayama, MD [facility] [phone]

## 2017-07-27 ENCOUNTER — Other Ambulatory Visit: Payer: Self-pay | Admitting: Internal Medicine

## 2017-08-01 ENCOUNTER — Other Ambulatory Visit: Payer: Self-pay | Admitting: Internal Medicine

## 2017-08-25 ENCOUNTER — Other Ambulatory Visit: Payer: Self-pay | Admitting: Internal Medicine

## 2017-09-10 ENCOUNTER — Other Ambulatory Visit: Payer: Self-pay | Admitting: Internal Medicine

## 2017-09-21 ENCOUNTER — Other Ambulatory Visit: Payer: Self-pay | Admitting: Internal Medicine

## 2017-09-24 ENCOUNTER — Other Ambulatory Visit: Payer: Self-pay | Admitting: Internal Medicine

## 2017-10-28 ENCOUNTER — Other Ambulatory Visit: Payer: Self-pay | Admitting: Internal Medicine

## 2017-10-29 ENCOUNTER — Other Ambulatory Visit: Payer: Self-pay | Admitting: Internal Medicine

## 2017-11-06 ENCOUNTER — Ambulatory Visit: Payer: 59 | Admitting: Internal Medicine

## 2017-11-06 ENCOUNTER — Encounter: Payer: Self-pay | Admitting: Internal Medicine

## 2017-11-06 VITALS — BP 118/80 | HR 64 | Temp 97.8°F | Resp 16 | Ht 75.0 in | Wt 273.2 lb

## 2017-11-06 DIAGNOSIS — E785 Hyperlipidemia, unspecified: Secondary | ICD-10-CM | POA: Diagnosis not present

## 2017-11-06 DIAGNOSIS — E119 Type 2 diabetes mellitus without complications: Secondary | ICD-10-CM | POA: Diagnosis not present

## 2017-11-06 DIAGNOSIS — I10 Essential (primary) hypertension: Secondary | ICD-10-CM | POA: Diagnosis not present

## 2017-11-06 LAB — COMPREHENSIVE METABOLIC PANEL
ALBUMIN: 4.2 g/dL (ref 3.5–5.2)
ALT: 16 U/L (ref 0–53)
AST: 13 U/L (ref 0–37)
Alkaline Phosphatase: 60 U/L (ref 39–117)
BILIRUBIN TOTAL: 0.7 mg/dL (ref 0.2–1.2)
BUN: 21 mg/dL (ref 6–23)
CALCIUM: 9.3 mg/dL (ref 8.4–10.5)
CO2: 31 mEq/L (ref 19–32)
Chloride: 100 mEq/L (ref 96–112)
Creatinine, Ser: 0.88 mg/dL (ref 0.40–1.50)
GFR: 93.84 mL/min (ref >60.00)
Glucose, Bld: 137 mg/dL — ABNORMAL HIGH (ref 70–99)
Potassium: 4.6 mEq/L (ref 3.5–5.1)
Sodium: 138 mEq/L (ref 135–145)
Total Protein: 6.8 g/dL (ref 6.0–8.3)

## 2017-11-06 LAB — LIPID PANEL
CHOLESTEROL: 188 mg/dL (ref 0–200)
HDL: 44.9 mg/dL (ref >39.00)
LDL Cholesterol: 109 mg/dL — ABNORMAL HIGH (ref 0–99)
NonHDL: 143.14
Total CHOL/HDL Ratio: 4
Triglycerides: 173 mg/dL — ABNORMAL HIGH (ref 0.0–149.0)
VLDL: 34.6 mg/dL (ref 0.0–40.0)

## 2017-11-06 LAB — HEMOGLOBIN A1C: Hgb A1c MFr Bld: 7.1 % — ABNORMAL HIGH (ref 4.6–6.5)

## 2017-11-06 MED ORDER — METFORMIN HCL 850 MG PO TABS: ORAL_TABLET | ORAL | 6 refills | Status: DC

## 2017-11-06 MED ORDER — DAPAGLIFLOZIN PROPANEDIOL 10 MG PO TABS: 10.0000 mg | ORAL_TABLET | Freq: Every day | ORAL | 6 refills | Status: DC

## 2017-11-06 NOTE — Progress Notes (Signed)
Subjective:    Patient ID: Anthony [name], male    DOB: 04-04-1955, 62 y.o.   MRN: [id number]  DOS:  11/06/2017 Type of visit - description : Routine follow-up Interval history: DM: Had to stop Invokana 4 weeks ago due to cost. High cholesterol: Good med compliance, due for FLP HTN: Good med compliance, due for labs.  Wt Readings from Last 3 Encounters:  11/06/17 273 lb 3.2 oz (123.9 kg)  06/25/17 272 lb 3.2 oz (123.5 kg)  03/01/17 259 lb (117.5 kg)     Review of Systems Complains of clogged feeling at the left ear w/ mild lightheadedness for the last few days.  Denies fever, chills or ear discharge Mild sinus congestion and cough with clear sputum.  Unclear if the sputum is from PND or the chest. Denies chest pain, difficulty breathing. No nausea, vomiting, diarrhea  Past Medical History:  Diagnosis Date  . Allergy   . Aortic root enlargement (HCC)    CT 42 mm 2014  . Chronic back pain   . Diabetes mellitus    type II  . DJD (degenerative joint disease)    per MRI of LS spine 2005  . GERD (gastroesophageal reflux disease)   . Hyperlipidemia   . Hypertension   . Hypogonadism male    dx w/ hypogonadism elsewhere ~ 2011  . Normal cardiac stress test    (-) stress test 2003, 2006  . S/P cardiac cath     04-2006 neg per pt--CP improved w/ PPIs  . Sleep apnea    uses cpap  . Sleep apnea   . Tachycardia    ER 07-2012, ?SCR, on atenolol    Past Surgical History:  Procedure Laterality Date  . CARDIAC CATHETERIZATION    . COLONOSCOPY    . CYSTECTOMY     from back  . POLYPECTOMY      Social History   Socioeconomic History  . Marital status: Married    Spouse name: Not on file  . Number of children: 2  . Years of education: Not on file  . Highest education level: Not on file  Occupational History  . Occupation: TRUCK DRIVER    Employer: [employer]  Social Needs  . Financial resource strain: Not on file  . Food insecurity:    Worry: Not on file   Inability: Not on file  . Transportation needs:    Medical: Not on file    Non-medical: Not on file  Tobacco Use  . Smoking status: Former Smoker    Types: Cigars    Last attempt to quit: 03/12/1981    Years since quitting: 36.6  . Smokeless tobacco: Never Used  Substance and Sexual Activity  . Alcohol use: Yes    Alcohol/week: 0.0 standard drinks    Comment: occassinally  . Drug use: No  . Sexual activity: Not on file  Lifestyle  . Physical activity:    Days per week: Not on file    Minutes per session: Not on file  . Stress: Not on file  Relationships  . Social connections:    Talks on phone: Not on file    Gets together: Not on file    Attends religious service: Not on file    Active member of club or organization: Not on file    Attends meetings of clubs or organizations: Not on file    Relationship status: Not on file  . Intimate partner violence:    Fear of current or  ex partner: Not on file    Emotionally abused: Not on file    Physically abused: Not on file    Forced sexual activity: Not on file  Other Topics Concern  . Not on file  Social History Narrative   Truck driver (local); lives w/ wife      Allergies as of 11/06/2017   No Known Allergies     Medication List        Accurate as of 11/06/17  4:47 PM. Always use your most recent med list.          aspirin 81 MG tablet Take 81 mg by mouth daily.   atenolol 50 MG tablet Commonly known as:  TENORMIN Take 0.5 tablets (25 mg total) by mouth daily.   atorvastatin 40 MG tablet Commonly known as:  LIPITOR Take 0.5 tablets (20 mg total) by mouth every other day.   dapagliflozin propanediol 10 MG Tabs tablet Commonly known as:  FARXIGA Take 10 mg by mouth daily.   glucose blood test strip Check blood sugar no more than twice daily.   hydrochlorothiazide 25 MG tablet Commonly known as:  HYDRODIURIL Take 1 tablet (25 mg total) by mouth daily.   losartan 25 MG tablet Commonly known as:   COZAAR Take 1 tablet (25 mg total) by mouth daily.   metFORMIN 850 MG tablet Commonly known as:  GLUCOPHAGE 2 tablets in the morning and 1 tablet in the afternoon. Take it with food   pioglitazone 30 MG tablet Commonly known as:  ACTOS Take 1 tablet (30 mg total) by mouth daily.          Objective:   Physical Exam BP 118/80 (BP Location: Left Arm, Patient Position: Sitting, Cuff Size: Large)   Pulse 64   Temp 97.8 F (36.6 C) (Oral)   Resp 16   Ht 6\' 3"  (1.905 m)   Wt 273 lb 3.2 oz (123.9 kg)   SpO2 97%   BMI 34.15 kg/m  General:   Well developed, NAD, see BMI.  HEENT:  Normocephalic . Face symmetric, atraumatic TMs bulging bilaterally without redness.  Nose is slightly congested, sinuses no TTP, throat symmetric and not red. Lungs:  CTA B Normal respiratory effort, no intercostal retractions, no accessory muscle use. Heart: RRR,  no murmur.  No pretibial edema bilaterally  Skin: Not pale. Not jaundice Neurologic:  alert & oriented X3.  Speech normal, gait appropriate for age and unassisted Psych--  Cognition and judgment appear intact.  Cooperative with normal attention span and concentration.  Behavior appropriate. No anxious or depressed appearing.      Assessment & Plan:   Assessment  DM HTN Hyperlipidemia CV Dr [name], next visit 08-2016 --Tachycardia : ER eval 2014,   rx atenolol --stress test 2003, 2006; Chest pain-2008,  (-) cardiac cath, sx decrease with PPIs ---Aorta enlargment per CT  2014, saw surgery 12-2016, they will cont observation OSA , severe - on Cpap GERD DJD, Chronic back pain, MRI 2005 showed OA Hypogonadism DX 2011 + FH Prostate ca brother age 24    P LAN DM: On Actos and metformin 850 twice a day.  Unable to afford Invokana for the last 4 weeks, CBGs in the morning usually 130s but now increased to the 150s. Plan: A1c, increase metformin to 850 mg 3 tablets daily. Start [employer] discount coupon provided. HTN: Continue  Tenormin, losartan, HCTZ.  Check a CMP High cholesterol: On Lipitor, check a FLP. Ear discomfort: Recommend Flonase and Astelin for  few weeks, no obvious infection on exam, call if not better RTC 2 months

## 2017-11-06 NOTE — Assessment & Plan Note (Signed)
DM: On Actos and metformin 850 twice a day.  Unable to afford Invokana for the last 4 weeks, CBGs in the morning usually 130s but now increased to the 150s. Plan: A1c, increase metformin to 850 mg 3 tablets daily. Start [employer] discount coupon provided. HTN: Continue Tenormin, losartan, HCTZ.  Check a CMP High cholesterol: On Lipitor, check a FLP. Ear discomfort: Recommend Flonase and Astelin for few weeks, no obvious infection on exam, call if not better RTC 2 months

## 2017-11-06 NOTE — Patient Instructions (Signed)
GO TO THE LAB : Get the blood work     GO TO THE FRONT DESK Schedule your next appointment for a checkup in 2 months  Increase metformin: 2 tablets in the morning with the biggest meal either breakfast or lunch.  1 tablet with your dinner  Instead of Invokana, try [place] 1 daily.  ,

## 2017-11-10 ENCOUNTER — Other Ambulatory Visit: Payer: Self-pay | Admitting: Internal Medicine

## 2017-11-13 ENCOUNTER — Telehealth: Payer: Self-pay | Admitting: Internal Medicine

## 2017-11-13 MED ORDER — SITAGLIPTIN PHOSPHATE 100 MG PO TABS: 100.0000 mg | ORAL_TABLET | Freq: Every day | ORAL | 6 refills | Status: DC

## 2017-11-13 NOTE — Telephone Encounter (Signed)
Unable to afford farxiga. Options are limited due to cost. Noted he previously he did not like onglyza, will try Januvia 100 mg 1 tablet daily, send the prescription, will need a coupon

## 2017-11-13 NOTE — Telephone Encounter (Signed)
Januvia 100mg  sent to [facility]. Also faxed discount card.

## 2017-11-20 ENCOUNTER — Other Ambulatory Visit: Payer: Self-pay | Admitting: *Deleted

## 2017-11-20 DIAGNOSIS — I712 Thoracic aortic aneurysm, without rupture, unspecified: Secondary | ICD-10-CM

## 2017-11-22 ENCOUNTER — Other Ambulatory Visit: Payer: Self-pay

## 2017-11-22 MED ORDER — ATENOLOL 50 MG PO TABS: 25.0000 mg | ORAL_TABLET | Freq: Every day | ORAL | 0 refills | Status: DC

## 2017-11-28 ENCOUNTER — Other Ambulatory Visit: Payer: Self-pay | Admitting: Internal Medicine

## 2017-12-20 ENCOUNTER — Ambulatory Visit
Admission: RE | Admit: 2017-12-20 | Discharge: 2017-12-20 | Disposition: A | Discharge status: Home / Self Care | Payer: 59 | Source: Ambulatory Visit | Attending: Thoracic Surgery (Cardiothoracic Vascular Surgery) | Admitting: Thoracic Surgery (Cardiothoracic Vascular Surgery)

## 2017-12-20 DIAGNOSIS — I77810 Thoracic aortic ectasia: Secondary | ICD-10-CM | POA: Diagnosis not present

## 2017-12-20 DIAGNOSIS — I712 Thoracic aortic aneurysm, without rupture, unspecified: Secondary | ICD-10-CM

## 2017-12-20 MED ORDER — GADOBENATE DIMEGLUMINE 529 MG/ML IV SOLN
20.0000 mL | Freq: Once | INTRAVENOUS | Status: AC | PRN
  Administered 2017-12-20: 20 mL via INTRAVENOUS

## 2017-12-24 ENCOUNTER — Encounter: Payer: Self-pay | Admitting: Internal Medicine

## 2017-12-24 ENCOUNTER — Encounter: Payer: Self-pay | Admitting: Thoracic Surgery (Cardiothoracic Vascular Surgery)

## 2017-12-24 ENCOUNTER — Ambulatory Visit: Payer: 59 | Admitting: Thoracic Surgery (Cardiothoracic Vascular Surgery)

## 2017-12-24 ENCOUNTER — Other Ambulatory Visit: Payer: Self-pay

## 2017-12-24 VITALS — BP 122/84 | HR 77 | Resp 16 | Ht 75.0 in | Wt 280.0 lb

## 2017-12-24 DIAGNOSIS — I712 Thoracic aortic aneurysm, without rupture, unspecified: Secondary | ICD-10-CM

## 2017-12-24 NOTE — Progress Notes (Signed)
[address]       [place], [address]             [phone]    HPI: Mr. Guia returns for follow-up of the sinus of Valsalva aneurysm.  Pickrell is a 62 year old man with a past history of hypertension, hyperlipidemia, gastroesophageal reflux, type 2 diabetes, sleep apnea, and tachycardia.  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  It was noted on echocardiogram.  A CT angiogram reported only showed it to be 42 mm.  He is been followed since then.   I last saw him in April.  It was about 4.5 cm at that time.  There is some variability between radiology reports.  He continues to have chronic shortness of breath which is not changed.  He still has this pain across his chest if he exerts himself after eating.  He not experience that discomfort if he has not eaten.  He can walk 2 to 3 miles without any difficulty. Past Medical History:  Diagnosis Date  . Allergy   . Aortic root enlargement (HCC)    CT 42 mm 2014  . Chronic back pain   . Diabetes mellitus    type II  . DJD (degenerative joint disease)    per MRI of LS spine 2005  . GERD (gastroesophageal reflux disease)   . Hyperlipidemia   . Hypertension   . Hypogonadism male    dx w/ hypogonadism elsewhere ~ 2011  . Normal cardiac stress test    (-) stress test 2003, 2006  . S/P cardiac cath     04-2006 neg per pt--CP improved w/ PPIs  . Sleep apnea    uses cpap  . Sleep apnea   . Tachycardia    ER 07-2012, ?SCR, on atenolol    Current Outpatient Medications  Medication Sig Dispense Refill  . aspirin 81 MG tablet Take 81 mg by mouth daily.      Marland Kitchen atenolol (TENORMIN) 50 MG tablet Take 0.5 tablets (25 mg total) by mouth daily. Please schedule appt for future refills thank you. 30 tablet 0  . atorvastatin (LIPITOR) 40 MG tablet Take 0.5 tablets (20 mg total) by mouth every other day. 30 tablet 3  . glucose blood (ONE TOUCH ULTRA TEST) test strip Check blood sugar no more than twice daily. 100  each 99  . hydrochlorothiazide (HYDRODIURIL) 25 MG tablet Take 1 tablet (25 mg total) by mouth daily. 30 tablet 5  . losartan (COZAAR) 25 MG tablet Take 1 tablet (25 mg total) by mouth daily. 30 tablet 2  . metFORMIN (GLUCOPHAGE) 850 MG tablet Take 2 tablets by mouth with breakfast and 1 tablet by mouth with dinner 90 tablet 1  . pioglitazone (ACTOS) 30 MG tablet Take 1 tablet (30 mg total) by mouth daily. 30 tablet 0  . sitaGLIPtin (JANUVIA) 100 MG tablet Take 1 tablet (100 mg total) by mouth daily. 30 tablet 6   Current Facility-Administered Medications  Medication Dose Route Frequency Provider Last Rate Last Dose  . 0.9 %  sodium chloride infusion  500 mL Intravenous Once Ladene Artist, MD        Physical Exam BP 122/84 (BP Location: Right Arm, Patient Position: Sitting, Cuff Size: Large)   Pulse 77   Resp 16   Ht 6\' 3"  (1.905 m)   Wt 280 lb (127 kg)   SpO2 96% Comment: ON RA  BMI 35.55 kg/m  62 year old  man in no acute distress Alert and oriented x3 with no focal deficits No carotid bruits Cardiac regular rate and rhythm no audible murmur Lungs clear with equal breath sounds bilaterally No peripheral edema  Diagnostic Tests: MRA CHEST WITH OR WITHOUT CONTRAST  TECHNIQUE: Angiographic images of the chest were obtained using MRA technique with intravenous contrast.  CONTRAST:  91mL MULTIHANCE GADOBENATE DIMEGLUMINE 529 MG/ML IV SOLN  Creatinine was obtained on site at [facility] at [address]  Results: Creatinine 0.9 mg/dL.  GFR 93.  COMPARISON:  06/21/2017  FINDINGS: VASCULAR  Aorta: Aortic root measures up to 4.8 cm diameter (previously 4.7). Normal caliber of ascending, arch, and descending segments with stable mild tortuosity. No dissection or stenosis. Classic 3 vessel brachiocephalic arterial origin anatomy without proximal stenosis. No significant atheromatous irregularity identified. Proximal abdominal aorta  unremarkable.  Heart: Normal size.  Pulmonary Arteries: Normal caliber centrally, limited evaluation of segmental and subsegmental branches.  Other: No pleural or pericardial effusion.  NON-VASCULAR  Spinal cord: Negative  Brachial plexus: Negative  Muscles and tendons: Negative  Bones: Negative  Joints: Negative  IMPRESSION: VASCULAR  1. Aortic root dilatation up to 4.8 cm. Tortuous nondilated thoracic aorta.  NON-VASCULAR  1. No acute findings.   Electronically Signed   By: Lucrezia Europe M.D.   On: 12/20/2017 10:52 I personally reviewed the MR images.  I think the variation in size is due to technique.  I do not think there is been any significant change.  This is difficult to size due to its nearly vertical orientation.  Impression: Mr. Yutzy is a 62 year old gentleman with a history of hypertension, hyperlipidemia, type 2 diabetes, and tachycardia.  He is been followed for dilated aortic root since 2014.  He has dilated sinuses of Valsalva with a normal ascending aneurysm above the sinotubular junction.  There is some variability in measurement of the sinus of Valsalva primarily because the aortic valve has a nearly vertical orientation, I am not sure the measurements are being done in a true cross-sectional plane.  Comparing studies sequentially there has been no significant change recently.  Needs continued semiannual follow-up.  Hypertension-blood pressure well controlled on current regimen  Hyperlipidemia-on Lipitor  Plan: Return in 6 months with MR Angio  Melrose Nakayama, MD [facility] [phone]

## 2017-12-31 ENCOUNTER — Other Ambulatory Visit: Payer: Self-pay | Admitting: Internal Medicine

## 2018-01-03 DIAGNOSIS — H2513 Age-related nuclear cataract, bilateral: Secondary | ICD-10-CM | POA: Diagnosis not present

## 2018-01-03 DIAGNOSIS — E119 Type 2 diabetes mellitus without complications: Secondary | ICD-10-CM | POA: Diagnosis not present

## 2018-01-03 LAB — HM DIABETES EYE EXAM

## 2018-01-07 ENCOUNTER — Ambulatory Visit: Payer: 59 | Admitting: Internal Medicine

## 2018-01-07 ENCOUNTER — Encounter: Payer: Self-pay | Admitting: Internal Medicine

## 2018-01-07 VITALS — BP 124/66 | HR 60 | Temp 97.4°F | Resp 16 | Ht 75.0 in | Wt 276.2 lb

## 2018-01-07 DIAGNOSIS — E785 Hyperlipidemia, unspecified: Secondary | ICD-10-CM

## 2018-01-07 DIAGNOSIS — E119 Type 2 diabetes mellitus without complications: Secondary | ICD-10-CM

## 2018-01-07 DIAGNOSIS — I7789 Other specified disorders of arteries and arterioles: Secondary | ICD-10-CM

## 2018-01-07 MED ORDER — PRAVASTATIN SODIUM 20 MG PO TABS: 20.0000 mg | ORAL_TABLET | Freq: Every day | ORAL | 3 refills | Status: DC

## 2018-01-07 NOTE — Progress Notes (Signed)
Pre visit review using our clinic review tool, if applicable. No additional management support is needed unless otherwise documented below in the visit note. 

## 2018-01-07 NOTE — Assessment & Plan Note (Signed)
P LAN DM: Currently on metformin, Actos, Januvia.  Since the last visit reports he is doing better with diet and practicing portion control. We will check A1c in 6 weeks. High cholesterol: Last LDL 109, he reports today that has not taking Lipitor in over a year thus LDL of 109  is baseline.  Discussed with patient the importance of taking statins in the context of diabetes; he agreed to try Pravachol.  AST, ALT and FLP in 6 weeks. Thoracic aortic enlargement.  Saw Dr. Roxan Hockey 12-24-17  Preventive care: Declined it a flu shot, having a mild URI, states we will get it later.  RTC 6 weeks -labs 4 months CPX

## 2018-01-07 NOTE — Progress Notes (Signed)
Subjective:    Patient ID: Anthony [name], male    DOB: 10/20/1955, 62 y.o.   MRN: [id number]  DOS:  01/07/2018 Type of visit - description : rov Interval history: DM: Good compliance with medication, ambulatory CBGs 120, 130 High cholesterol: Today he reports that he has not been taking Lipitor for a year due to aches and pains HTN: Good ambulatory BPs.   Review of Systems Reports diarrhea on and off, about 3 times a week, no blood in the stools, see comments under assessment and plan.  Past Medical History:  Diagnosis Date  . Allergy   . Aortic root enlargement (HCC)    CT 42 mm 2014  . Chronic back pain   . Diabetes mellitus    type II  . DJD (degenerative joint disease)    per MRI of LS spine 2005  . GERD (gastroesophageal reflux disease)   . Hyperlipidemia   . Hypertension   . Hypogonadism male    dx w/ hypogonadism elsewhere ~ 2011  . Normal cardiac stress test    (-) stress test 2003, 2006  . S/P cardiac cath     04-2006 neg per pt--CP improved w/ PPIs  . Sleep apnea    uses cpap  . Sleep apnea   . Tachycardia    ER 07-2012, ?SCR, on atenolol    Past Surgical History:  Procedure Laterality Date  . CARDIAC CATHETERIZATION    . CYSTECTOMY     from back  . POLYPECTOMY      Social History   Socioeconomic History  . Marital status: Married    Spouse name: Not on file  . Number of children: 2  . Years of education: Not on file  . Highest education level: Not on file  Occupational History  . Occupation: TRUCK DRIVER    Employer: [employer]  Social Needs  . Financial resource strain: Not on file  . Food insecurity:    Worry: Not on file    Inability: Not on file  . Transportation needs:    Medical: Not on file    Non-medical: Not on file  Tobacco Use  . Smoking status: Former Smoker    Types: Cigars    Last attempt to quit: 03/12/1981    Years since quitting: 36.8  . Smokeless tobacco: Never Used  Substance and Sexual Activity  . Alcohol use:  Yes    Alcohol/week: 0.0 standard drinks    Comment: occassinally  . Drug use: No  . Sexual activity: Not on file  Lifestyle  . Physical activity:    Days per week: Not on file    Minutes per session: Not on file  . Stress: Not on file  Relationships  . Social connections:    Talks on phone: Not on file    Gets together: Not on file    Attends religious service: Not on file    Active member of club or organization: Not on file    Attends meetings of clubs or organizations: Not on file    Relationship status: Not on file  . Intimate partner violence:    Fear of current or ex partner: Not on file    Emotionally abused: Not on file    Physically abused: Not on file    Forced sexual activity: Not on file  Other Topics Concern  . Not on file  Social History Narrative   Truck driver (local); lives w/ wife      Allergies as  of 01/07/2018   No Known Allergies     Medication List        Accurate as of 01/07/18  3:38 PM. Always use your most recent med list.          aspirin 81 MG tablet Take 81 mg by mouth daily.   atenolol 50 MG tablet Commonly known as:  TENORMIN Take 0.5 tablets (25 mg total) by mouth daily. Please schedule appt for future refills thank you.   glucose blood test strip Check blood sugar no more than twice daily.   hydrochlorothiazide 25 MG tablet Commonly known as:  HYDRODIURIL Take 1 tablet (25 mg total) by mouth daily.   losartan 25 MG tablet Commonly known as:  COZAAR Take 1 tablet (25 mg total) by mouth daily.   metFORMIN 850 MG tablet Commonly known as:  GLUCOPHAGE Take 2 tablets by mouth with breakfast and 1 tablet by mouth with dinner   pioglitazone 30 MG tablet Commonly known as:  ACTOS Take 1 tablet (30 mg total) by mouth daily.   pravastatin 20 MG tablet Commonly known as:  PRAVACHOL Take 1 tablet (20 mg total) by mouth daily.   sitaGLIPtin 100 MG tablet Commonly known as:  JANUVIA Take 1 tablet (100 mg total) by mouth  daily.          Objective:   Physical Exam BP 124/66 (BP Location: Left Arm, Patient Position: Sitting, Cuff Size: Small)   Pulse 60   Temp (!) 97.4 F (36.3 C) (Oral)   Resp 16   Ht 6\' 3"  (1.905 m)   Wt 276 lb 4 oz (125.3 kg)   SpO2 94%   BMI 34.53 kg/m  General:   Well developed, NAD, see BMI.  HEENT:  Normocephalic . Face symmetric, atraumatic Lungs:  CTA B Normal respiratory effort, no intercostal retractions, no accessory muscle use. Heart: RRR,  no murmur.  No pretibial edema bilaterally  Skin: Not pale. Not jaundice Neurologic:  alert & oriented X3.  Speech normal, gait appropriate for age and unassisted Psych--  Cognition and judgment appear intact.  Cooperative with normal attention span and concentration.  Behavior appropriate. No anxious or depressed appearing.      Assessment & Plan:   Assessment  DM HTN Hyperlipidemia CV Dr [name], next visit 08-2016 --Tachycardia : ER eval 2014,   rx atenolol --stress test 2003, 2006; Chest pain-2008,  (-) cardiac cath, sx decrease with PPIs ---Aorta enlargment per CT  2014, saw surgery 12-2016, they will cont observation OSA , severe - on Cpap GERD DJD, Chronic back pain, MRI 2005 showed OA Hypogonadism DX 2011 + FH Prostate ca brother age 21    P LAN DM: Currently on metformin, Actos, Januvia.  Since the last visit reports he is doing better with diet and practicing portion control. We will check A1c in 6 weeks. High cholesterol: Last LDL 109, he reports today that has not taking Lipitor in over a year thus LDL of 109  is baseline.  Discussed with patient the importance of taking statins in the context of diabetes; he agreed to try Pravachol.  AST, ALT and FLP in 6 weeks. Thoracic aortic enlargement.  Saw Dr. Roxan Hockey 12-24-17  Preventive care: Declined it a flu shot, having a mild URI, states we will get it later.  RTC 6 weeks -labs 4 months CPX

## 2018-01-07 NOTE — Patient Instructions (Signed)
  GO TO THE FRONT DESK Schedule labs 6 weeks from today, fasting  Schedule your next appointment for a  Physical exam in 4 months    Start pravachol

## 2018-01-10 ENCOUNTER — Encounter: Payer: Self-pay | Admitting: Internal Medicine

## 2018-01-12 DIAGNOSIS — J019 Acute sinusitis, unspecified: Secondary | ICD-10-CM | POA: Diagnosis not present

## 2018-01-18 ENCOUNTER — Other Ambulatory Visit: Payer: Self-pay | Admitting: Cardiovascular Disease

## 2018-02-01 DIAGNOSIS — J0100 Acute maxillary sinusitis, unspecified: Secondary | ICD-10-CM | POA: Diagnosis not present

## 2018-02-14 ENCOUNTER — Other Ambulatory Visit: Payer: Self-pay | Admitting: Cardiovascular Disease

## 2018-02-16 ENCOUNTER — Other Ambulatory Visit: Payer: Self-pay | Admitting: Cardiovascular Disease

## 2018-02-16 ENCOUNTER — Other Ambulatory Visit: Payer: Self-pay | Admitting: Internal Medicine

## 2018-02-18 ENCOUNTER — Encounter (INDEPENDENT_AMBULATORY_CARE_PROVIDER_SITE_OTHER): Payer: 59 | Admitting: Internal Medicine

## 2018-02-18 ENCOUNTER — Other Ambulatory Visit (INDEPENDENT_AMBULATORY_CARE_PROVIDER_SITE_OTHER): Payer: 59

## 2018-02-18 ENCOUNTER — Encounter: Payer: Self-pay | Admitting: Internal Medicine

## 2018-02-18 VITALS — Ht 75.0 in

## 2018-02-18 DIAGNOSIS — E119 Type 2 diabetes mellitus without complications: Secondary | ICD-10-CM

## 2018-02-18 DIAGNOSIS — E785 Hyperlipidemia, unspecified: Secondary | ICD-10-CM | POA: Diagnosis not present

## 2018-02-18 LAB — LIPID PANEL
CHOLESTEROL: 134 mg/dL (ref 0–200)
HDL: 39.4 mg/dL (ref >39.00)
LDL Cholesterol: 77 mg/dL (ref 0–99)
NonHDL: 94.93
Total CHOL/HDL Ratio: 3
Triglycerides: 90 mg/dL (ref 0.0–149.0)
VLDL: 18 mg/dL (ref 0.0–40.0)

## 2018-02-18 LAB — ALT: ALT: 14 U/L (ref 0–53)

## 2018-02-18 LAB — AST: AST: 13 U/L (ref 0–37)

## 2018-02-18 LAB — HEMOGLOBIN A1C: Hgb A1c MFr Bld: 6.5 % (ref 4.6–6.5)

## 2018-02-18 NOTE — Progress Notes (Deleted)
Pre visit review using our clinic review tool, if applicable. No additional management support is needed unless otherwise documented below in the visit note. 

## 2018-02-21 MED ORDER — PRAVASTATIN SODIUM 20 MG PO TABS: 20.0000 mg | ORAL_TABLET | Freq: Every day | ORAL | 1 refills | Status: DC

## 2018-02-21 NOTE — Addendum Note (Signed)
Addended byDamita Dunnings D on: 02/21/2018 07:49 AM   Modules accepted: Orders

## 2018-03-13 ENCOUNTER — Other Ambulatory Visit: Payer: Self-pay

## 2018-03-13 MED ORDER — ATENOLOL 50 MG PO TABS: 25.0000 mg | ORAL_TABLET | Freq: Every day | ORAL | 0 refills | Status: DC

## 2018-03-17 NOTE — Progress Notes (Signed)
CARDIOLOGY OFFICE NOTE  Date:  03/18/2018    Anthony [name] Date of Birth: 1956-03-02 Medical Record #[id number]  PCP:  Colon Branch, MD  Cardiologist:  [name]    No chief complaint on file.   History of Present Illness:  63 y.o. previous atypical chest pain with normal cath in 2008. History of atrial arrhythmias seen by Dr Caryl Comes and  Rx with atenolol. Has aortic root dilatation measures 4.8 cm by recent MRI October 2019.  This is followed by Dr Roxan Hockey  BP is well controlledOn ARB, beta blocker and diuretic. 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 Lacks motivation and work hours start at noon and go through early am   Past Medical History:  Diagnosis Date  . Allergy   . Aortic root enlargement (HCC)    CT 42 mm 2014  . Chronic back pain   . Diabetes mellitus    type II  . DJD (degenerative joint disease)    per MRI of LS spine 2005  . GERD (gastroesophageal reflux disease)   . Hyperlipidemia   . Hypertension   . Hypogonadism male    dx w/ hypogonadism elsewhere ~ 2011  . Normal cardiac stress test    (-) stress test 2003, 2006  . S/P cardiac cath     04-2006 neg per pt--CP improved w/ PPIs  . Sleep apnea    uses cpap  . Sleep apnea   . Tachycardia    ER 07-2012, ?SCR, on atenolol    Past Surgical History:  Procedure Laterality Date  . CARDIAC CATHETERIZATION    . CYSTECTOMY     from back  . POLYPECTOMY       Medications: No outpatient medications have been marked as taking for the 03/18/18 encounter (Office Visit) with Josue Hector, MD.     Allergies: No Known Allergies  Social History: The patient  reports that he quit smoking about 37 years ago. His smoking use included cigars. He has never used smokeless tobacco. He reports current alcohol use. He  reports that he does not use drugs.   Family History: The patient's family history includes Bladder Cancer in his paternal uncle; Colon polyps in his brother; Diabetes in an other family member; Heart disease in his brother and father; Prostate cancer in his brother; Stomach cancer in his paternal uncle.   Review of Systems: Please see the history of present illness.   Otherwise, the review of systems is positive for none.   All other systems are reviewed and negative.   Physical Exam: VS:  BP 126/80   Pulse 70   Ht 6\' 3"  (1.905 m)   Wt 280 lb (127 kg)   SpO2 99%   BMI 35.00 kg/m  .  BMI Body mass index is 35 kg/m.  Wt Readings from Last 3 Encounters:  03/18/18 280 lb (127 kg)  01/07/18 276 lb 4 oz (125.3 kg)  12/24/17 280 lb (127 kg)    Affect appropriate Healthy:  appears stated age 4: normal Neck supple with no adenopathy JVP normal no bruits no thyromegaly Lungs clear with no wheezing and good diaphragmatic motion Heart:  S1/S2 no murmur, no rub, gallop or click PMI normal Abdomen: benighn, BS positve, no tenderness, no AAA no bruit.  No HSM or HJR Distal pulses intact with no bruits No edema Neuro non-focal Skin warm and dry No muscular weakness    LABORATORY DATA:  EKG:   .11/20/16 SR rate 73 normal  03/18/18 SR rate 70 normal   Lab Results  Component Value Date   WBC 7.3 01/16/2017   HGB 14.4 01/16/2017   HCT 42.3 01/16/2017   PLT 269.0 01/16/2017   GLUCOSE 137 (H) 11/06/2017   CHOL 134 02/18/2018   TRIG 90.0 02/18/2018   HDL 39.40 02/18/2018   LDLCALC 77 02/18/2018   ALT 14 02/18/2018   AST 13 02/18/2018   NA 138 11/06/2017   K 4.6 11/06/2017   CL 100 11/06/2017   CREATININE 0.88 11/06/2017   BUN 21 11/06/2017   CO2 31 11/06/2017   TSH 1.17 10/18/2016   PSA 0.74 12/14/2016   INR 1.0 RATIO 05/06/2006   HGBA1C 6.5 02/18/2018   MICROALBUR 0.3 10/18/2016     BNP (last 3 results) No results for input(s): BNP in the last 8760  hours.  ProBNP (last 3 results) No results for input(s): PROBNP in the last 8760 hours.   Other Studies Reviewed Today:  Echo Study Conclusions 08/2015  - Left ventricle: The cavity size was normal. There was mild focal   basal hypertrophy of the septum. Systolic function was vigorous.   The estimated ejection fraction was in the range of 65% to 70%.   Wall motion was normal; there were no regional wall motion   abnormalities. Features are consistent with a pseudonormal left   ventricular filling pattern, with concomitant abnormal relaxation   and increased filling pressure (grade 2 diastolic dysfunction). - Aorta: Aortic root dimension: 44 mm (ED) sinus of Valsalva. - Aortic root: The aortic root was moderately dilated. - Left atrium: The atrium was mildly dilated. Volume/bsa, ES,   (1-plane Simpson&'s, A2C): 35.8 ml/m^2. - Right ventricle: The cavity size was mildly dilated. Wall   thickness was normal.  Impressions:  - Previous echo 2015 - aortic root 10mm. No significant change.   Assessment/Plan:  1. Aortic Root Dilatation: BP well controlled Root 4.8 cm stable by MRI 12/20/17  F/U Dr Roxan Hockey Plans MRA in 6 months   2. H/o Symptomatic Atrial Arrhthymias: well controlled with atenolol.    3. HTN: well controlled on current regimen.   4. HLD: followed by PCP. Well controlled on Lipitor. Recent Lipid panel noted.  5. DM: followed by PCP - he is trying to do better with diet/weight loss.  6. Obesity: he seems motivated to make changes. Discussed at length.   Current medicines are reviewed with the patient today.  The patient does not have concerns regarding medicines other than what has been noted above.  The following changes have been made:  See above.  Labs/ tests ordered today include:    Orders Placed This Encounter  Procedures  . EKG 12-Lead     Disposition:   FU with me in a year   Patient is agreeable to this plan and will call if any  problems develop in the interim.   Signed: Jenkins Rouge, MD  03/18/2018 11:06 AM  [facility] [address] [place], [place]  [address] Phone: [phone] Fax: [phone]

## 2018-03-18 ENCOUNTER — Encounter: Payer: Self-pay | Admitting: Cardiovascular Disease

## 2018-03-18 ENCOUNTER — Ambulatory Visit: Payer: 59 | Admitting: Cardiovascular Disease

## 2018-03-18 VITALS — BP 126/80 | HR 70 | Ht 75.0 in | Wt 280.0 lb

## 2018-03-18 DIAGNOSIS — E785 Hyperlipidemia, unspecified: Secondary | ICD-10-CM

## 2018-03-18 DIAGNOSIS — I77810 Thoracic aortic ectasia: Secondary | ICD-10-CM | POA: Diagnosis not present

## 2018-03-18 DIAGNOSIS — I10 Essential (primary) hypertension: Secondary | ICD-10-CM | POA: Diagnosis not present

## 2018-03-18 NOTE — Patient Instructions (Addendum)

## 2018-03-26 ENCOUNTER — Other Ambulatory Visit: Payer: Self-pay | Admitting: Internal Medicine

## 2018-04-06 ENCOUNTER — Other Ambulatory Visit: Payer: Self-pay | Admitting: Cardiovascular Disease

## 2018-05-06 ENCOUNTER — Other Ambulatory Visit: Payer: Self-pay | Admitting: Internal Medicine

## 2018-05-07 ENCOUNTER — Other Ambulatory Visit: Payer: Self-pay | Admitting: *Deleted

## 2018-05-07 DIAGNOSIS — I712 Thoracic aortic aneurysm, without rupture, unspecified: Secondary | ICD-10-CM

## 2018-05-09 DIAGNOSIS — D1722 Benign lipomatous neoplasm of skin and subcutaneous tissue of left arm: Secondary | ICD-10-CM | POA: Diagnosis not present

## 2018-05-09 DIAGNOSIS — D1721 Benign lipomatous neoplasm of skin and subcutaneous tissue of right arm: Secondary | ICD-10-CM | POA: Diagnosis not present

## 2018-05-09 DIAGNOSIS — D225 Melanocytic nevi of trunk: Secondary | ICD-10-CM | POA: Diagnosis not present

## 2018-05-16 ENCOUNTER — Encounter: Payer: Self-pay | Admitting: Internal Medicine

## 2018-05-16 ENCOUNTER — Ambulatory Visit (INDEPENDENT_AMBULATORY_CARE_PROVIDER_SITE_OTHER): Payer: 59 | Admitting: Internal Medicine

## 2018-05-16 VITALS — BP 128/80 | HR 68 | Temp 98.0°F | Resp 16 | Ht 75.0 in | Wt 276.2 lb

## 2018-05-16 DIAGNOSIS — Z23 Encounter for immunization: Secondary | ICD-10-CM

## 2018-05-16 DIAGNOSIS — Z0000 Encounter for general adult medical examination without abnormal findings: Secondary | ICD-10-CM | POA: Diagnosis not present

## 2018-05-16 DIAGNOSIS — E119 Type 2 diabetes mellitus without complications: Secondary | ICD-10-CM

## 2018-05-16 DIAGNOSIS — Z125 Encounter for screening for malignant neoplasm of prostate: Secondary | ICD-10-CM

## 2018-05-16 LAB — CBC WITH DIFFERENTIAL/PLATELET
Basophils Absolute: 0 10*3/uL (ref 0.0–0.1)
Basophils Relative: 0.7 % (ref 0.0–3.0)
Eosinophils Absolute: 0.1 10*3/uL (ref 0.0–0.7)
Eosinophils Relative: 1.8 % (ref 0.0–5.0)
HCT: 39.7 % (ref 39.0–52.0)
Hemoglobin: 13.7 g/dL (ref 13.0–17.0)
LYMPHS ABS: 1.1 10*3/uL (ref 0.7–4.0)
Lymphocytes Relative: 18.1 % (ref 12.0–46.0)
MCHC: 34.5 g/dL (ref 30.0–36.0)
MCV: 91.4 fl (ref 78.0–100.0)
Monocytes Absolute: 0.6 10*3/uL (ref 0.1–1.0)
Monocytes Relative: 10.1 % (ref 3.0–12.0)
NEUTROS ABS: 4.3 10*3/uL (ref 1.4–7.7)
Neutrophils Relative %: 69.3 % (ref 43.0–77.0)
Platelets: 259 10*3/uL (ref 150.0–400.0)
RBC: 4.34 Mil/uL (ref 4.22–5.81)
RDW: 12.6 % (ref 11.5–15.5)
WBC: 6.3 10*3/uL (ref 4.0–10.5)

## 2018-05-16 LAB — BASIC METABOLIC PANEL
BUN: 19 mg/dL (ref 6–23)
CALCIUM: 9.1 mg/dL (ref 8.4–10.5)
CO2: 29 meq/L (ref 19–32)
Chloride: 101 mEq/L (ref 96–112)
Creatinine, Ser: 0.86 mg/dL (ref 0.40–1.50)
GFR: 90.51 mL/min (ref >60.00)
Glucose, Bld: 143 mg/dL — ABNORMAL HIGH (ref 70–99)
Potassium: 4 mEq/L (ref 3.5–5.1)
Sodium: 138 mEq/L (ref 135–145)

## 2018-05-16 LAB — HEMOGLOBIN A1C: Hgb A1c MFr Bld: 6.6 % — ABNORMAL HIGH (ref 4.6–6.5)

## 2018-05-16 LAB — TSH: TSH: 1.59 u[IU]/mL (ref 0.35–4.50)

## 2018-05-16 LAB — PSA: PSA: 0.79 ng/mL (ref 0.10–4.00)

## 2018-05-16 NOTE — Assessment & Plan Note (Signed)
HI:[id number] CBGs in the 110, 120 range.  Continue metformin, Actos and Januvia HTN: Ambulatory BPs 120/80, continue losartan, Tenormin, HCTZ.  Checking labs. Hyperlipidemia: Controlled OSA: Good CPAP compliance Hypogonadism: Remote, has a family history of prostate cancer, on no treatment, feels well .  At some point consider DEXA RTC 6 months

## 2018-05-16 NOTE — Assessment & Plan Note (Addendum)
-  Tdad 05/2018; zostavax 2014; pnm shot 2014; prevnar 05-2015; shingrex :declined for now . Declined flu shot   -+ FH Prostate cancer:   DRE (-) today, check a PSA -CCS: s/p multiple  Cscopes,   11-2013,   02/2017 + polyps , next per GI   -FH CAD: ASA, pravastatin, control CV RF -diet  and exercise: Doing well. -LABS: BMP, CBC, A1c, TSH, PSA

## 2018-05-16 NOTE — Progress Notes (Signed)
Pre visit review using our clinic review tool, if applicable. No additional management support is needed unless otherwise documented below in the visit note. 

## 2018-05-16 NOTE — Patient Instructions (Signed)
GO TO THE LAB : Get the blood work     GO TO THE FRONT DESK Schedule your next appointment for a checkup, fasting in 6 months

## 2018-05-16 NOTE — Progress Notes (Signed)
Subjective:    Patient ID: Anthony [name], male    DOB: 1955-10-22, 63 y.o.   MRN: [id number]  DOS:  05/16/2018 Type of visit - description: CPX In general feels well.    Review of Systems Works second shift, shift finish at 1 AM,  sometimes unable to sleep more than 5 or 6 hours. Good compliance with CPAP, good energy level.  Other than above, a 14 point review of systems is negative   Past Medical History:  Diagnosis Date  . Allergy   . Aortic root enlargement (HCC)    CT 42 mm 2014  . Chronic back pain   . Diabetes mellitus    type II  . DJD (degenerative joint disease)    per MRI of LS spine 2005  . GERD (gastroesophageal reflux disease)   . Hyperlipidemia   . Hypertension   . Hypogonadism male    dx w/ hypogonadism elsewhere ~ 2011  . Normal cardiac stress test    (-) stress test 2003, 2006  . S/P cardiac cath     04-2006 neg per pt--CP improved w/ PPIs  . Sleep apnea    uses cpap  . Tachycardia    ER 07-2012, ?SCR, on atenolol    Past Surgical History:  Procedure Laterality Date  . CARDIAC CATHETERIZATION    . CYSTECTOMY     from back  . POLYPECTOMY      Social History   Socioeconomic History  . Marital status: Married    Spouse name: Not on file  . Number of children: 2  . Years of education: Not on file  . Highest education level: Not on file  Occupational History  . Occupation: TRUCK DRIVER    Employer: [employer]  Social Needs  . Financial resource strain: Not on file  . Food insecurity:    Worry: Not on file    Inability: Not on file  . Transportation needs:    Medical: Not on file    Non-medical: Not on file  Tobacco Use  . Smoking status: Former Smoker    Types: Cigars    Last attempt to quit: 03/12/1981    Years since quitting: 37.2  . Smokeless tobacco: Never Used  Substance and Sexual Activity  . Alcohol use: Yes    Alcohol/week: 0.0 standard drinks    Comment: occassinally  . Drug use: No  . Sexual activity: Not on file    Lifestyle  . Physical activity:    Days per week: Not on file    Minutes per session: Not on file  . Stress: Not on file  Relationships  . Social connections:    Talks on phone: Not on file    Gets together: Not on file    Attends religious service: Not on file    Active member of club or organization: Not on file    Attends meetings of clubs or organizations: Not on file    Relationship status: Not on file  . Intimate partner violence:    Fear of current or ex partner: Not on file    Emotionally abused: Not on file    Physically abused: Not on file    Forced sexual activity: Not on file  Other Topics Concern  . Not on file  Social History Narrative   Truck driver (local); lives w/ wife     Family History  Problem Relation Age of Onset  . Heart disease Father  CABG at age 9  . Colon polyps Brother   . Heart disease Brother        CABG  . Diabetes Other        cousins  . Prostate cancer Brother 18  . Stomach cancer Paternal Uncle   . Bladder Cancer Paternal Uncle   . Colon cancer Neg Hx   . Rectal cancer Neg Hx   . Esophageal cancer Neg Hx     Allergies as of 05/16/2018   No Known Allergies     Medication List       Accurate as of May 16, 2018  5:00 PM. Always use your most recent med list.        aspirin 81 MG tablet Take 81 mg by mouth daily.   atenolol 50 MG tablet Commonly known as:  TENORMIN TAKE 1/2 TABLET BY MOUTH DAILY   glucose blood test strip Commonly known as:  ONE TOUCH ULTRA TEST Check blood sugar no more than twice daily.   hydrochlorothiazide 25 MG tablet Commonly known as:  HYDRODIURIL Take 1 tablet (25 mg total) by mouth daily.   losartan 25 MG tablet Commonly known as:  COZAAR Take 1 tablet (25 mg total) by mouth daily.   metFORMIN 850 MG tablet Commonly known as:  GLUCOPHAGE TAKE 2 TABLETS BY MOUTH WITH BREAKFAST AND 1 TABLET BY MOUTH WITH DINNER   pioglitazone 30 MG tablet Commonly known as:  ACTOS Take 1 tablet  (30 mg total) by mouth daily.   pravastatin 20 MG tablet Commonly known as:  Pravachol Take 1 tablet (20 mg total) by mouth daily.   sitaGLIPtin 100 MG tablet Commonly known as:  Januvia Take 1 tablet (100 mg total) by mouth daily.           Objective:   Physical Exam BP 128/80 (BP Location: Left Arm, Patient Position: Sitting, Cuff Size: Normal)   Pulse 68   Temp 98 F (36.7 C) (Oral)   Resp 16   Ht 6\' 3"  (1.905 m)   Wt 276 lb 4 oz (125.3 kg)   SpO2 96%   BMI 34.53 kg/m  General: Well developed, NAD, BMI noted Neck: No  thyromegaly  HEENT:  Normocephalic . Face symmetric, atraumatic Lungs:  CTA B Normal respiratory effort, no intercostal retractions, no accessory muscle use. Heart: RRR,  no murmur.  No pretibial edema bilaterally  Abdomen:  Not distended, soft, non-tender. No rebound or rigidity.   Skin: Exposed areas without rash. Not pale. Not jaundice DRE: Normal sphincter tone, no stools, prostate normal. Neurologic:  alert & oriented X3.  Speech normal, gait appropriate for age and unassisted Strength symmetric and appropriate for age.  Psych: Cognition and judgment appear intact.  Cooperative with normal attention span and concentration.  Behavior appropriate. No anxious or depressed appearing.     Assessment    Assessment  DM HTN Hyperlipidemia CV Dr [name], next visit 08-2016 --Tachycardia : ER eval 2014,   rx atenolol --stress test 2003, 2006; Chest pain-2008,  (-) cardiac cath, sx decrease with PPIs ---Aorta enlargment per CT  2014, saw surgery 12-2016, they will cont observation OSA , severe - on Cpap GERD DJD, Chronic back pain, MRI 2005 showed OA Hypogonadism DX 2011, 2013:  FSH, LH and prolactin, declined Endo referral + FH Prostate ca brother age 69    PLAN VC:[id number] CBGs in the 110, 120 range.  Continue metformin, Actos and Januvia HTN: Ambulatory BPs 120/80, continue losartan, Tenormin, HCTZ.  Checking labs.  Hyperlipidemia:  Controlled OSA: Good CPAP compliance Hypogonadism: Remote, has a family history of prostate cancer, on no treatment, feels well .  At some point consider DEXA RTC 6 months

## 2018-05-19 ENCOUNTER — Other Ambulatory Visit: Payer: Self-pay | Admitting: Internal Medicine

## 2018-05-19 MED ORDER — TELMISARTAN 20 MG PO TABS: 20.0000 mg | ORAL_TABLET | Freq: Every day | ORAL | 1 refills | Status: DC

## 2018-05-19 NOTE — Telephone Encounter (Signed)
Switch to telmisartan 20 mg 1 tablet daily, send a new prescription.  Advised patient to monitor BPs to be sure that they continue to be controlled.

## 2018-05-19 NOTE — Telephone Encounter (Signed)
Telmisartan 20mg  sent to [facility]. Pt informed via MyChart.

## 2018-05-19 NOTE — Telephone Encounter (Signed)
All losartan doses on back order at [facility] pharmacy.

## 2018-05-20 ENCOUNTER — Other Ambulatory Visit: Payer: Self-pay | Admitting: Internal Medicine

## 2018-05-20 MED ORDER — TELMISARTAN 40 MG PO TABS: 20.0000 mg | ORAL_TABLET | Freq: Every day | ORAL | 1 refills | Status: DC

## 2018-05-20 NOTE — Telephone Encounter (Signed)
Telmisartan 20mg  on back order.

## 2018-05-20 NOTE — Telephone Encounter (Signed)
Telmisartan 40mg  1/2 tablet daily sent to [facility] pharmacy.

## 2018-05-20 NOTE — Telephone Encounter (Signed)
Try telmisartan 40 mg: Half tablet daily. If not available, ask pharmacist what ARBs are available

## 2018-06-08 ENCOUNTER — Other Ambulatory Visit: Payer: Self-pay | Admitting: Internal Medicine

## 2018-06-27 ENCOUNTER — Other Ambulatory Visit: Payer: 59

## 2018-06-28 ENCOUNTER — Other Ambulatory Visit: Payer: Self-pay | Admitting: Internal Medicine

## 2018-07-01 ENCOUNTER — Encounter: Payer: 59 | Admitting: Thoracic Surgery (Cardiothoracic Vascular Surgery)

## 2018-08-08 ENCOUNTER — Other Ambulatory Visit: Payer: Self-pay

## 2018-08-08 ENCOUNTER — Ambulatory Visit
Admission: RE | Admit: 2018-08-08 | Discharge: 2018-08-08 | Disposition: A | Discharge status: Home / Self Care | Payer: 59 | Source: Ambulatory Visit | Attending: Thoracic Surgery (Cardiothoracic Vascular Surgery) | Admitting: Thoracic Surgery (Cardiothoracic Vascular Surgery)

## 2018-08-08 DIAGNOSIS — I712 Thoracic aortic aneurysm, without rupture, unspecified: Secondary | ICD-10-CM

## 2018-08-08 MED ORDER — GADOBENATE DIMEGLUMINE 529 MG/ML IV SOLN
20.0000 mL | Freq: Once | INTRAVENOUS | Status: AC | PRN
  Administered 2018-08-08: 20 mL via INTRAVENOUS

## 2018-08-11 ENCOUNTER — Other Ambulatory Visit: Payer: Self-pay

## 2018-08-12 ENCOUNTER — Ambulatory Visit: Payer: 59 | Admitting: Thoracic Surgery (Cardiothoracic Vascular Surgery)

## 2018-08-12 ENCOUNTER — Encounter: Payer: Self-pay | Admitting: Thoracic Surgery (Cardiothoracic Vascular Surgery)

## 2018-08-12 VITALS — BP 130/82 | HR 83 | Temp 97.7°F | Resp 20 | Ht 75.0 in | Wt 279.0 lb

## 2018-08-12 DIAGNOSIS — Q2544 Congenital dilation of aorta: Secondary | ICD-10-CM | POA: Diagnosis not present

## 2018-08-12 DIAGNOSIS — I712 Thoracic aortic aneurysm, without rupture, unspecified: Secondary | ICD-10-CM

## 2018-08-12 DIAGNOSIS — Q2549 Other congenital malformations of aorta: Secondary | ICD-10-CM

## 2018-08-12 NOTE — Progress Notes (Signed)
[address]       [place] [address]             [phone]      HPI: Mr. Deshotel returns for a scheduled follow-up visit  Anthony [name] is a 63 year old gentleman with history of hypertension, hyperlipidemia, reflux, type 2 diabetes, sleep apnea, and tachycardia.  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  It was first noted on an echocardiogram.  It was 4.2 cm by CT angiogram.  I follow him annually and then in 2019 it increased in size to 4.5 cm.  At his last visit in October it was read as being 4.8 cm, however looking back at previous CT and MRI was, it was unchanged at 4.6 cm.  I the interim since his last visit he has been doing well.  He has an occasional fleeting left-sided sharp chest pain.  This usually lasts a very short period of time and is not necessarily associated with exertion.  He continues to work.  Past Medical History:  Diagnosis Date  . Allergy   . Aortic root enlargement (HCC)    CT 42 mm 2014  . Chronic back pain   . Diabetes mellitus    type II  . DJD (degenerative joint disease)    per MRI of LS spine 2005  . GERD (gastroesophageal reflux disease)   . Hyperlipidemia   . Hypertension   . Hypogonadism male    dx w/ hypogonadism elsewhere ~ 2011  . Normal cardiac stress test    (-) stress test 2003, 2006  . S/P cardiac cath     04-2006 neg per pt--CP improved w/ PPIs  . Sleep apnea    uses cpap  . Tachycardia    ER 07-2012, ?SCR, on atenolol     Current Outpatient Medications  Medication Sig Dispense Refill  . aspirin 81 MG tablet Take 81 mg by mouth daily.      Marland Kitchen atenolol (TENORMIN) 50 MG tablet TAKE 1/2 TABLET BY MOUTH DAILY 45 tablet 3  . glucose blood (ONE TOUCH ULTRA TEST) test strip Check blood sugar no more than twice daily. 100 each 99  . hydrochlorothiazide (HYDRODIURIL) 25 MG tablet Take 1 tablet (25 mg total) by mouth daily. 30 tablet 5  . metFORMIN (GLUCOPHAGE) 850 MG tablet TAKE 2 TABLETS BY MOUTH WITH  BREAKFAST AND 1 TABLET BY MOUTH WITH DINNER 90 tablet 8  . pioglitazone (ACTOS) 30 MG tablet Take 1 tablet (30 mg total) by mouth daily. 90 tablet 1  . pravastatin (PRAVACHOL) 20 MG tablet Take 1 tablet (20 mg total) by mouth daily. 90 tablet 1  . sitaGLIPtin (JANUVIA) 100 MG tablet Take 1 tablet (100 mg total) by mouth daily. 30 tablet 3  . telmisartan (MICARDIS) 40 MG tablet Take 0.5 tablets (20 mg total) by mouth daily. 45 tablet 1   No current facility-administered medications for this visit.     Physical Exam BP 130/82   Pulse 83   Temp 97.7 F (36.5 C) (Skin)   Resp 20   Ht 6\' 3"  (1.905 m)   Wt 279 lb (126.6 kg)   SpO2 94% Comment: RA  BMI 34.68 kg/m  63 year old man in no acute distress Alert and oriented x3 with no focal deficits No carotid bruits Cardiac regular rate and rhythm with a normal S1 and S2, no rubs or murmurs Lungs clear bilaterally No peripheral edema  Diagnostic Tests: MRA CHEST  WITH OR WITHOUT CONTRAST  TECHNIQUE: Angiographic images of the chest were obtained using MRA technique 20 cc MultiHance intravenous contrast.  CONTRAST:  76mL MULTIHANCE GADOBENATE DIMEGLUMINE 529 MG/ML IV SOLN  Creatinine was obtained on site at [facility] at [address]  Results: Creatinine 0.9 mg/dL.  COMPARISON:  12/20/2017  FINDINGS: VASCULAR  Aorta: Maximal diameters of the ascending aorta at the sinus of Valsalva, sino-tubular junction, and ascending aorta are 4.6 cm, 3.3 cm, and 3.3 cm. Previously, maximal diameter was recorded at 4.8 cm. There is no evidence of aortic dissection. There is no evidence of acute intramural hematoma on T1 noncontrast images. Aorta remains tortuous. Great vessels are patent. Vertebral arteries are also patent within the confines of the thorax and lower neck.  Heart: Mildly enlarged.  Pulmonary Arteries: There is no obvious evidence of acute pulmonary thromboembolism on venous phase images.   Other: Noncontributory  NON-VASCULAR  No evidence of mediastinal mass effect. Lungs are grossly clear. No pneumothorax or pleural effusion  IMPRESSION: VASCULAR  Stable dilatation at the sinus of Valsalva at 4.6 cm. Previously, this was recorded at 4.8 cm. There is no evidence of aortic dissection or acute intramural hematoma.  NON-VASCULAR  No acute intrathoracic pathology.   Electronically Signed   By: Marybelle Killings M.D.   On: 08/08/2018 13:47 I personally reviewed the MR images and concur with the findings noted above  Impression: Secundino Ellithorpe is a 63 year old gentleman with a history of hypertension, hyperlipidemia, type 2 diabetes, reflux, sleep apnea, and tachycardia.  He was found to have a dilated aortic root back in 2014.  He has been followed since that time.  His MR angio today showed the aortic root is 4.6 cm in size.  It has been stable at that size for the last 3 visits.  He needs another scan in 6 months.  Hypertension-blood pressure well controlled on current regimen  Hyperlipidemia-on Pravachol  Type 2 diabetes-on Actos, Januvia, and metformin.  Most recent A1c was 6.6  Plan: Return in 6 months with MR angio of chest  Melrose Nakayama, MD [facility] [phone]

## 2018-09-07 ENCOUNTER — Other Ambulatory Visit: Payer: Self-pay | Admitting: Internal Medicine

## 2018-09-08 MED ORDER — TELMISARTAN 40 MG PO TABS: 20.0000 mg | ORAL_TABLET | Freq: Every day | ORAL | 1 refills | Status: DC

## 2018-09-28 ENCOUNTER — Other Ambulatory Visit: Payer: Self-pay | Admitting: Internal Medicine

## 2018-10-05 ENCOUNTER — Other Ambulatory Visit: Payer: Self-pay | Admitting: Internal Medicine

## 2018-10-06 MED ORDER — TELMISARTAN 40 MG PO TABS: 20.0000 mg | ORAL_TABLET | Freq: Every day | ORAL | 1 refills | Status: DC

## 2018-10-17 ENCOUNTER — Other Ambulatory Visit: Payer: Self-pay | Admitting: Internal Medicine

## 2018-10-20 MED ORDER — TELMISARTAN 40 MG PO TABS: 20.0000 mg | ORAL_TABLET | Freq: Every day | ORAL | 1 refills | Status: DC

## 2018-10-25 ENCOUNTER — Other Ambulatory Visit: Payer: Self-pay | Admitting: Internal Medicine

## 2018-10-27 ENCOUNTER — Encounter: Payer: Self-pay | Admitting: Internal Medicine

## 2018-10-27 ENCOUNTER — Other Ambulatory Visit: Payer: Self-pay | Admitting: Internal Medicine

## 2018-10-27 MED ORDER — LOSARTAN POTASSIUM 25 MG PO TABS: 25.0000 mg | ORAL_TABLET | Freq: Every day | ORAL | 6 refills | Status: DC

## 2018-10-27 MED ORDER — TELMISARTAN 40 MG PO TABS: 20.0000 mg | ORAL_TABLET | Freq: Every day | ORAL | 1 refills | Status: DC

## 2018-10-27 NOTE — Telephone Encounter (Signed)
See message, DC telmisartan, losartan 25 mg #30 and 6 refills

## 2018-10-27 NOTE — Telephone Encounter (Signed)
Pt having side effects to telmisartan- headaches, low sex drive- he is requesting to go back on losartan- please advise.

## 2018-11-01 ENCOUNTER — Other Ambulatory Visit: Payer: Self-pay | Admitting: Internal Medicine

## 2018-11-20 ENCOUNTER — Other Ambulatory Visit: Payer: Self-pay

## 2018-11-21 ENCOUNTER — Encounter: Payer: Self-pay | Admitting: Internal Medicine

## 2018-11-21 ENCOUNTER — Ambulatory Visit: Payer: 59 | Admitting: Internal Medicine

## 2018-11-21 VITALS — BP 130/82 | HR 74 | Temp 97.1°F | Resp 18 | Ht 75.0 in | Wt 279.4 lb

## 2018-11-21 DIAGNOSIS — E119 Type 2 diabetes mellitus without complications: Secondary | ICD-10-CM

## 2018-11-21 DIAGNOSIS — I10 Essential (primary) hypertension: Secondary | ICD-10-CM

## 2018-11-21 LAB — BASIC METABOLIC PANEL
BUN: 22 mg/dL (ref 6–23)
CO2: 28 mEq/L (ref 19–32)
Calcium: 9 mg/dL (ref 8.4–10.5)
Chloride: 104 mEq/L (ref 96–112)
Creatinine, Ser: 0.88 mg/dL (ref 0.40–1.50)
GFR: 87.99 mL/min (ref >60.00)
Glucose, Bld: 134 mg/dL — ABNORMAL HIGH (ref 70–99)
Potassium: 4.2 mEq/L (ref 3.5–5.1)
Sodium: 140 mEq/L (ref 135–145)

## 2018-11-21 LAB — HEMOGLOBIN A1C: Hgb A1c MFr Bld: 7.1 % — ABNORMAL HIGH (ref 4.6–6.5)

## 2018-11-21 MED ORDER — PIOGLITAZONE HCL 30 MG PO TABS: 30.0000 mg | ORAL_TABLET | Freq: Every day | ORAL | 1 refills | Status: DC

## 2018-11-21 NOTE — Progress Notes (Signed)
Subjective:    Patient ID: Anthony [name], male    DOB: [birth year], 63 y.o.   MRN: [id number]  DOS:  11/21/2018 Type of visit - description: Routine office visit Chart is reviewed, saw cardiovascular surgery We reviewed his ambulatory BPs and CBGs.  Wt Readings from Last 3 Encounters:  11/21/18 279 lb 6.4 oz (126.7 kg)  08/12/18 279 lb (126.6 kg)  05/16/18 276 lb 4 oz (125.3 kg)     Review of Systems  Denies chest pain no difficulty breathing No lower extremity edema No nausea, vomiting, diarrhea. Occasional palpitations.  Past Medical History:  Diagnosis Date  . Allergy   . Aortic root enlargement (HCC)    CT 42 mm 2014  . Chronic back pain   . Diabetes mellitus    type II  . DJD (degenerative joint disease)    per MRI of LS spine 2005  . GERD (gastroesophageal reflux disease)   . Hyperlipidemia   . Hypertension   . Hypogonadism male    dx w/ hypogonadism elsewhere ~ 2011  . Normal cardiac stress test    (-) stress test 2003, 2006  . S/P cardiac cath     04-2006 neg per pt--CP improved w/ PPIs  . Sleep apnea    uses cpap  . Tachycardia    ER 07-2012, ?SCR, on atenolol    Past Surgical History:  Procedure Laterality Date  . CARDIAC CATHETERIZATION    . CYSTECTOMY     from back  . POLYPECTOMY      Social History   Socioeconomic History  . Marital status: Married    Spouse name: Not on file  . Number of children: 2  . Years of education: Not on file  . Highest education level: Not on file  Occupational History  . Occupation: TRUCK DRIVER    Employer: [employer]  Social Needs  . Financial resource strain: Not on file  . Food insecurity    Worry: Not on file    Inability: Not on file  . Transportation needs    Medical: Not on file    Non-medical: Not on file  Tobacco Use  . Smoking status: Former Smoker    Types: Cigars    Quit date: 03/12/1981    Years since quitting: 37.7  . Smokeless tobacco: Never Used  Substance and Sexual Activity  .  Alcohol use: Yes    Alcohol/week: 0.0 standard drinks    Comment: occassinally  . Drug use: No  . Sexual activity: Not on file  Lifestyle  . Physical activity    Days per week: Not on file    Minutes per session: Not on file  . Stress: Not on file  Relationships  . Social Herbalist on phone: Not on file    Gets together: Not on file    Attends religious service: Not on file    Active member of club or organization: Not on file    Attends meetings of clubs or organizations: Not on file    Relationship status: Not on file  . Intimate partner violence    Fear of current or ex partner: Not on file    Emotionally abused: Not on file    Physically abused: Not on file    Forced sexual activity: Not on file  Other Topics Concern  . Not on file  Social History Narrative   Truck driver (local); lives w/ wife      Allergies as of  11/21/2018   No Known Allergies     Medication List       Accurate as of November 21, 2018 11:59 PM. If you have any questions, ask your nurse or doctor.        aspirin 81 MG tablet Take 81 mg by mouth daily.   atenolol 50 MG tablet Commonly known as: TENORMIN TAKE 1/2 TABLET BY MOUTH DAILY   glucose blood test strip Commonly known as: ONE TOUCH ULTRA TEST Check blood sugar no more than twice daily.   hydrochlorothiazide 25 MG tablet Commonly known as: HYDRODIURIL Take 1 tablet (25 mg total) by mouth daily.   losartan 25 MG tablet Commonly known as: COZAAR Take 1 tablet (25 mg total) by mouth daily.   metFORMIN 850 MG tablet Commonly known as: GLUCOPHAGE TAKE 2 TABLETS BY MOUTH WITH BREAKFAST AND 1 TABLET BY MOUTH WITH DINNER   pioglitazone 30 MG tablet Commonly known as: ACTOS Take 1 tablet (30 mg total) by mouth daily.   pravastatin 20 MG tablet Commonly known as: PRAVACHOL Take 1 tablet (20 mg total) by mouth daily.   sitaGLIPtin 100 MG tablet Commonly known as: Januvia Take 1 tablet (100 mg total) by mouth daily.            Objective:   Physical Exam BP 130/82 (BP Location: Left Arm, Patient Position: Sitting, Cuff Size: Normal)   Pulse 74   Temp (!) 97.1 F (36.2 C) (Temporal)   Resp 18   Ht 6\' 3"  (1.905 m)   Wt 279 lb 6.4 oz (126.7 kg)   SpO2 98%   BMI 34.92 kg/m  General:   Well developed, NAD, BMI noted. HEENT:  Normocephalic . Face symmetric, atraumatic Lungs:  CTA B Normal respiratory effort, no intercostal retractions, no accessory muscle use. Heart: RRR,  no murmur.  No pretibial edema bilaterally  Skin: Not pale. Not jaundice Neurologic:  alert & oriented X3.  Speech normal, gait appropriate for age and unassisted Psych--  Cognition and judgment appear intact.  Cooperative with normal attention span and concentration.  Behavior appropriate. No anxious or depressed appearing.      Assessment    Assessment  DM HTN Hyperlipidemia CV Dr [name], next visit 08-2016 --Tachycardia : ER eval 2014,   rx atenolol --stress test 2003, 2006; Chest pain-2008,  (-) cardiac cath, sx decrease with PPIs ---Aorta enlargment per CT  2014, saw surgery 12-2016, they will cont observation OSA , severe - on Cpap GERD DJD, Chronic back pain, MRI 2005 showed OA Hypogonadism DX 2011, 2013:  FSH, LH and prolactin, declined Endo referral + FH Prostate ca brother age 69    PLAN DM: Currently on metformin, Actos, Januvia.  Ambulatory CBGs in the 120s.  Check a A1c.   Has been inactive lately but plans to go back to the gym.  Diet discussed. HTN: Seems controlled, at home BPs in the 117/72 range.  Continue losartan, HCTZ, check a BMP Aortic root dilatation: Last visit with cardiovascular surgery 08/2018, they recommended to RTC 6 months, will need MRI angioof the chest. Strongly recommended a flu shot: Declined. RTC 6 months CPX

## 2018-11-21 NOTE — Patient Instructions (Signed)
GO TO THE LAB : Get the blood work     GO TO THE FRONT DESK Schedule your next appointment   for a physical exam in 6 months  Continue checking your blood pressure, blood sugar

## 2018-11-22 NOTE — Assessment & Plan Note (Signed)
DM: Currently on metformin, Actos, Januvia.  Ambulatory CBGs in the 120s.  Check a A1c.   Has been inactive lately but plans to go back to the gym.  Diet discussed. HTN: Seems controlled, at home BPs in the 117/72 range.  Continue losartan, HCTZ, check a BMP Aortic root dilatation: Last visit with cardiovascular surgery 08/2018, they recommended to RTC 6 months, will need MRI angioof the chest. Strongly recommended a flu shot: Declined. RTC 6 months CPX

## 2018-12-22 ENCOUNTER — Other Ambulatory Visit: Payer: Self-pay | Admitting: Internal Medicine

## 2019-01-09 LAB — HM DIABETES EYE EXAM

## 2019-01-12 ENCOUNTER — Encounter: Payer: Self-pay | Admitting: Internal Medicine

## 2019-02-02 ENCOUNTER — Telehealth: Payer: Self-pay | Admitting: Cardiovascular Disease

## 2019-02-02 NOTE — Telephone Encounter (Signed)
Patient needs DOT letter by February 21, 2019. Made patient an appointment 02/13/19 since he has not been seen in almost a year. Will forward to Dr. [name] to see if patient needs any testing prior to appointment.

## 2019-02-02 NOTE — Telephone Encounter (Signed)
New Message  Patient states that it is time for his DOT physical and he needs a letter from Dr. 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 Please give patient a call once letter is ready to pick up.

## 2019-02-02 NOTE — Telephone Encounter (Signed)
No he is fine you can write letter to clear him to drive before visit

## 2019-02-02 NOTE — Telephone Encounter (Signed)
Letter completed. Will have Dr. [name] sign when he is back in the office.

## 2019-02-03 ENCOUNTER — Encounter: Payer: Self-pay | Admitting: Thoracic Surgery (Cardiothoracic Vascular Surgery)

## 2019-02-03 NOTE — Progress Notes (Signed)
      [address]       [place] [address]             [phone]      February 03, 2019  To whom it may concern  I follow Mr. Anthony Napoles. [name], DOB 1956-02-14, for an ascending aortic aneurysm.  This was first noted in 2014.  It has grown slightly in the interim, but has been stable over the past 18 months.  There is no indication for surgery.  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  Sincerely  Revonda Standard. Roxan Hockey, MD

## 2019-02-03 NOTE — Progress Notes (Unsigned)
      [address]       [place] [address]             [phone]      February 03, 2019  To whom it may concern,  I follow Anthony [name]. Wooden, DOB Jan 28, 1956, for an ascending aortic aneurysm that was first noted in 2014.  While it has grown slightly over time, it has been stable for the past 18 months.  There is no indication for surgery at this time.  This aneurysm does not impact [name] ability to drive a motor vehicle and should not be a determining factor in whether he obtains a Insurance account manager.  Sincerely,  Revonda Standard. Roxan Hockey, MD

## 2019-02-03 NOTE — Progress Notes (Signed)
      [address]       [place] [address]             [phone]      February 03, 2019.  To whom it may concern.  I follow Mr. Anthony Tufford. [name], DOB 1955/07/12, for an ascending aneurysm that was first discovered in 2014.  It has been followed since then.  Although it has grown slightly over time it has been stable over the past 18 months.  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  Sincerely  Revonda Standard. Roxan Hockey, MD

## 2019-02-11 NOTE — Progress Notes (Signed)
CARDIOLOGY OFFICE NOTE  Date:  02/13/2019    Anthony [name] Date of Birth: 01-12-1956 Medical Record [id number]  PCP:  Colon Branch, MD  Cardiologist:  [name]    No chief complaint on file.   History of Present Illness:  63 y.o. previous atypical chest pain with normal cath in 2008. History of atrial arrhythmias seen by Dr Caryl Comes and  Rx with atenolol. Has aortic root dilatation measures 4.6 cm by recent MRI May 2020  This is followed by Dr Roxan Hockey  BP is well controlledOn ARB, beta blocker and diuretic. 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 Allergy   . Aortic root enlargement (HCC)    CT 42 mm 2014  . Chronic back pain   . Diabetes mellitus    type II  . DJD (degenerative joint disease)    per MRI of LS spine 2005  . GERD (gastroesophageal reflux disease)   . Hyperlipidemia   . Hypertension   . Hypogonadism male    dx w/ hypogonadism elsewhere ~ 2011  . Normal cardiac stress test    (-) stress test 2003, 2006  . S/P cardiac cath     04-2006 neg per pt--CP improved w/ PPIs  . Sleep apnea    uses cpap  . Tachycardia    ER 07-2012, ?SCR, on atenolol    Past Surgical History:  Procedure Laterality Date  . CARDIAC CATHETERIZATION    . CYSTECTOMY     from back  . POLYPECTOMY       Medications: Current Meds  Medication Sig  . aspirin 81 MG tablet Take 81 mg by mouth daily.    Marland Kitchen atenolol (TENORMIN) 50 MG tablet TAKE 1/2 TABLET BY MOUTH DAILY  . hydrochlorothiazide (HYDRODIURIL) 25 MG tablet Take 1 tablet (25 mg total) by mouth daily.  Marland Kitchen losartan (COZAAR) 25 MG tablet Take 1 tablet (25 mg total)  by mouth daily.  . metFORMIN (GLUCOPHAGE) 850 MG tablet TAKE 2 TABLETS BY MOUTH WITH BREAKFAST AND 1 TABLET BY MOUTH WITH DINNER  . ONETOUCH ULTRA test strip CHECK BLOOD SUGAR NO MORE THAN TWICE DAILY. DX IS E11.9. MAX ON INSURANCE  . pioglitazone (ACTOS) 30 MG tablet Take 1 tablet (30 mg total) by mouth daily.  . pravastatin (PRAVACHOL) 20 MG tablet Take 1 tablet (20 mg total) by mouth daily.  . sitaGLIPtin (JANUVIA) 100 MG tablet Take 1 tablet (100 mg total) by mouth daily.     Allergies: No Known Allergies  Social History: The patient  reports that he quit smoking about 37 years ago. His smoking use included cigars. He has never used smokeless tobacco. He reports current alcohol use. He reports that he does not use drugs.   Family History: The patient's family history includes Bladder Cancer in his paternal uncle; Colon polyps in his brother; Diabetes in an other family member; Heart disease in his brother and father; Prostate cancer (age of onset: 61) in his brother; Stomach cancer in his paternal uncle.   Review of Systems: Please see the history of present illness.   Otherwise,  the review of systems is positive for none.   All other systems are reviewed and negative.   Physical Exam: VS:  BP 124/62   Pulse 76   Ht 6\' 3"  (1.905 m)   Wt 281 lb (127.5 kg)   SpO2 96%   BMI 35.12 kg/m  .  BMI Body mass index is 35.12 kg/m.  Wt Readings from Last 3 Encounters:  02/13/19 281 lb (127.5 kg)  11/21/18 279 lb 6.4 oz (126.7 kg)  08/12/18 279 lb (126.6 kg)    Affect appropriate Healthy:  appears stated age 63: normal Neck supple with no adenopathy JVP normal no bruits no thyromegaly Lungs clear with no wheezing and good diaphragmatic motion Heart:  S1/S2 no murmur, no rub, gallop or click PMI normal Abdomen: benighn, BS positve, no tenderness, no AAA no bruit.  No HSM or HJR Distal pulses intact with no bruits No edema Neuro non-focal Skin warm and dry No muscular  weakness    LABORATORY DATA:  EKG:   .11/20/16 SR rate 73 normal  03/18/18 SR rate 70 normal   Lab Results  Component Value Date   WBC 6.3 05/16/2018   HGB 13.7 05/16/2018   HCT 39.7 05/16/2018   PLT 259.0 05/16/2018   GLUCOSE 134 (H) 11/21/2018   CHOL 134 02/18/2018   TRIG 90.0 02/18/2018   HDL 39.40 02/18/2018   LDLCALC 77 02/18/2018   ALT 14 02/18/2018   AST 13 02/18/2018   NA 140 11/21/2018   K 4.2 11/21/2018   CL 104 11/21/2018   CREATININE 0.88 11/21/2018   BUN 22 11/21/2018   CO2 28 11/21/2018   TSH 1.59 05/16/2018   PSA 0.79 05/16/2018   INR 1.0 RATIO 05/06/2006   HGBA1C 7.1 (H) 11/21/2018   MICROALBUR 0.3 10/18/2016     BNP (last 3 results) No results for input(s): BNP in the last 8760 hours.  ProBNP (last 3 results) No results for input(s): PROBNP in the last 8760 hours.   Other Studies Reviewed Today:  Echo Study Conclusions 08/2015  - Left ventricle: The cavity size was normal. There was mild focal   basal hypertrophy of the septum. Systolic function was vigorous.   The estimated ejection fraction was in the range of 65% to 70%.   Wall motion was normal; there were no regional wall motion   abnormalities. Features are consistent with a pseudonormal left   ventricular filling pattern, with concomitant abnormal relaxation   and increased filling pressure (grade 2 diastolic dysfunction). - Aorta: Aortic root dimension: 44 mm (ED) sinus of Valsalva. - Aortic root: The aortic root was moderately dilated. - Left atrium: The atrium was mildly dilated. Volume/bsa, ES,   (1-plane Simpson&'s, A2C): 35.8 ml/m^2. - Right ventricle: The cavity size was mildly dilated. Wall   thickness was normal.  Impressions:  - Previous echo 2015 - aortic root 57mm. No significant change.   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 Aortic Root Dilatation: BP well controlled Root 4.6 cm stable by MRI  08/08/18   F/U Dr Roxan Hockey  2. H/o Symptomatic Atrial Arrhthymias: well controlled with atenolol.  Quiescent   3. HTN: well controlled on current regimen.   4. HLD: followed by PCP. Well controlled on Lipitor. Recent Lipid panel noted.  5. DM: followed by PCP - he is trying to do better with diet/weight loss.  6. Obesity: he seems motivated to make changes. Discussed at length.  Current medicines are reviewed with the patient today.  The patient does not have concerns regarding medicines other than what has been noted above.  The following changes have been made:  See above.  Labs/ tests ordered today include:    No orders of the defined types were placed in this encounter.    Disposition:   FU with me in a year   Patient is agreeable to this plan and will call if any problems develop in the interim.   Signed: Jenkins Rouge, MD  02/13/2019 3:07 PM  [facility] [address] [place], [place]  [address] Phone: [phone] Fax: [phone]

## 2019-02-13 ENCOUNTER — Other Ambulatory Visit: Payer: Self-pay

## 2019-02-13 ENCOUNTER — Encounter: Payer: Self-pay | Admitting: Cardiovascular Disease

## 2019-02-13 ENCOUNTER — Ambulatory Visit: Payer: 59 | Admitting: Cardiovascular Disease

## 2019-02-13 VITALS — BP 124/62 | HR 76 | Ht 75.0 in | Wt 281.0 lb

## 2019-02-13 DIAGNOSIS — I77810 Thoracic aortic ectasia: Secondary | ICD-10-CM | POA: Diagnosis not present

## 2019-02-13 NOTE — Patient Instructions (Signed)

## 2019-03-13 HISTORY — PX: KNEE ARTHROSCOPY WITH MENISCAL REPAIR: SHX5653

## 2019-03-15 ENCOUNTER — Other Ambulatory Visit: Payer: Self-pay | Admitting: Internal Medicine

## 2019-03-31 ENCOUNTER — Ambulatory Visit: Payer: 59 | Admitting: Cardiovascular Disease

## 2019-04-11 ENCOUNTER — Other Ambulatory Visit: Payer: Self-pay | Admitting: Cardiovascular Disease

## 2019-04-12 ENCOUNTER — Other Ambulatory Visit: Payer: Self-pay | Admitting: Internal Medicine

## 2019-04-13 ENCOUNTER — Other Ambulatory Visit: Payer: Self-pay | Admitting: Internal Medicine

## 2019-04-13 ENCOUNTER — Other Ambulatory Visit: Payer: Self-pay | Admitting: Cardiovascular Disease

## 2019-04-13 MED ORDER — ATENOLOL 50 MG PO TABS: 25.0000 mg | ORAL_TABLET | Freq: Every day | ORAL | 3 refills | Status: DC

## 2019-04-13 NOTE — Telephone Encounter (Signed)
Pt's medication was sent to pt's pharmacy as requested. Confirmation received.  °

## 2019-04-21 ENCOUNTER — Encounter: Payer: Self-pay | Admitting: Internal Medicine

## 2019-05-01 ENCOUNTER — Encounter: Payer: Self-pay | Admitting: Allergy

## 2019-05-01 ENCOUNTER — Ambulatory Visit: Payer: 59 | Admitting: Allergy

## 2019-05-01 ENCOUNTER — Other Ambulatory Visit: Payer: Self-pay

## 2019-05-01 VITALS — BP 106/74 | HR 64 | Temp 98.2°F | Resp 16 | Ht 74.0 in | Wt 274.6 lb

## 2019-05-01 DIAGNOSIS — R12 Heartburn: Secondary | ICD-10-CM

## 2019-05-01 DIAGNOSIS — G4733 Obstructive sleep apnea (adult) (pediatric): Secondary | ICD-10-CM | POA: Diagnosis not present

## 2019-05-01 DIAGNOSIS — J310 Chronic rhinitis: Secondary | ICD-10-CM

## 2019-05-01 MED ORDER — AZELASTINE-FLUTICASONE 137-50 MCG/ACT NA SUSP: 1.0000 | Freq: Two times a day (BID) | NASAL | 5 refills | Status: DC

## 2019-05-01 NOTE — Assessment & Plan Note (Signed)
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 Tried Flonase, zyrtec and Claritin inconsistently with minimal benefit. No previous allergy testing or ENT evaluation. Uses CPAP for OSA for 3-4 years.  Today's skin testing showed: Negative to environmental allergies.  He most likely has non-allergic rhinitis.   Start dymista (fluticasone + azelastine nasal spray combination) 1 spray per nostril twice a day.  If this is not covered by insurance then let us know.   Nasal saline spray (i.e., Simply Saline) or nasal saline lavage (i.e., NeilMed) is recommended as needed and prior to medicated nasal sprays.  May use over the counter antihistamines such as Zyrtec (cetirizine), Claritin (loratadine), Allegra (fexofenadine), or Xyzal (levocetirizine) daily as needed.  If above regimen does not help then will refer to ENT next.   Try to use the humidified air function on CPAP machine.

## 2019-05-01 NOTE — Patient Instructions (Addendum)
Today's skin testing showed: Negative to environmental allergies. You most likely have non-allergic rhinitis.    Start dymista (fluticasone + azelastine nasal spray combination) 1 spray per nostril twice a day.  If this is not covered by your insurance then let us know.   Nasal saline spray (i.e., Simply Saline) or nasal saline lavage (i.e., NeilMed) is recommended as needed and prior to medicated nasal sprays.  May use over the counter antihistamines such as Zyrtec (cetirizine), Claritin (loratadine), Allegra (fexofenadine), or Xyzal (levocetirizine) daily as needed.  Follow up in 2 months or sooner if needed.  If still having issues then will refer you to ENT. Try to use the humidified air function on your CPAP machine.

## 2019-05-01 NOTE — Progress Notes (Signed)
New Patient Note  RE: Anthony [name] MRN: [id number] DOB: 1955-07-26 Date of Office Visit: 05/01/2019  Referring provider: Colon Branch, MD Primary care provider: Colon Branch, MD  Chief Complaint: Allergic Rhinitis   History of Present Illness: I had the pleasure of seeing Garwin Stadelman for initial evaluation at the [facility] on 05/01/2019. He is a 64 y.o. male, who is self-referred here for the evaluation of allergies.  Rhinitis: He reports symptoms of sneezing, itchy/watery eyes, rhinorrhea, nasal congestion, headaches, itchy nose. Symptoms have been going on for many years. The symptoms are present all year around with worsening in spring. Other triggers include exposure to unsure. Anosmia: no. Headache: yes. He has used Flonase, zyrtec, Claritin with minimal improvement in symptoms. Sinus infections: yes - treated with antibiotics with minimal benefit. Previous work up includes: no. Previous ENT evaluation: no. Previous sinus imaging: no. History of nasal polyps: no. Last eye exam: 3-4 months ago. History of reflux: some symptoms but does not take medications. Patient wears a CPAP machine at night for the past 3-4 years which helped his snoring.   Assessment and Plan: Brason is a 64 y.o. male with: Chronic rhinitis Perennial rhino conjunctivitis symptoms with headaches for many years with worsening in the spring. Tried Flonase, zyrtec and Claritin inconsistently with minimal benefit. No previous allergy testing or ENT evaluation. Uses CPAP for OSA for 3-4 years.  Today's skin testing showed: Negative to environmental allergies.  He most likely has non-allergic rhinitis.   Start dymista (fluticasone + azelastine nasal spray combination) 1 spray per nostril twice a day.  If this is not covered by insurance then let us know.   Nasal saline spray (i.e., Simply Saline) or nasal saline lavage (i.e., NeilMed) is recommended as needed and prior to medicated nasal  sprays.  May use over the counter antihistamines such as Zyrtec (cetirizine), Claritin (loratadine), Allegra (fexofenadine), or Xyzal (levocetirizine) daily as needed.  If above regimen does not help then will refer to ENT next.   Try to use the humidified air function on CPAP machine.  Heartburn Some heartburn symptoms but not taking any medications.  Monitor symptoms.  If not improved, will give handout on heartburn lifestyle modifications diet and do a trial of either famotidine or PPI as uncontrolled reflux/heartburn can aggravate sinus symptoms.  Return in about 2 months (around 06/29/2019).  Meds ordered this encounter  Medications  . Azelastine-Fluticasone 137-50 MCG/ACT SUSP    Sig: Place 1 spray into the nose in the morning and at bedtime.    Dispense:  23 g    Refill:  5   Other allergy screening: Asthma: no Food allergy: no Medication allergy: no Hymenoptera allergy: no Urticaria: no Eczema:no History of recurrent infections suggestive of immunodeficency: no  Diagnostics: Skin Testing: Environmental allergy panel. Negative test to: environmental allergy panel.  Results discussed with patient/family. Airborne Adult Perc - 05/01/19 1429    Time Antigen Placed  1430    Allergen Manufacturer  Lavella Hammock    Location  Back    Number of Test  59    Panel 1  Select    1. Control-Buffer 50% Glycerol  Negative    2. Control-Histamine 1 mg/ml  2+    3. Albumin saline  Negative    4. [place]  Negative    5. [place]  Negative    6. [place]  Negative    7. [place] Blue  Negative    8. Meadow Fescue  Negative    9. Perennial Rye  Negative    10. Sweet Vernal  Negative    11. Timothy  Negative    12. Cocklebur  Negative    13. Burweed Marshelder  Negative    14. Ragweed, short  Negative    15. Ragweed, Giant  Negative    16. Plantain,  English  Negative    17. Lamb's Quarters  Negative    18. Sheep Sorrell  Negative    19. Rough Pigweed  Negative    20. Marsh Elder,  Rough  Negative    21. Mugwort, Common  Negative    22. Ash mix  Negative    23. Birch mix  Negative    24. Beech American  Negative    25. Box, Elder  Negative    26. Cedar, red  Negative    27. Cottonwood, [place]  Negative    28. Elm mix  Negative    29. Hickory mix  Negative    30. Maple mix  Negative    31. Oak, [place] mix  Negative    32. Pecan Pollen  Negative    33. Pine mix  Negative    34. Sycamore Eastern  Negative    35. Derry, Black Pollen  Negative    36. Alternaria alternata  Negative    37. Cladosporium Herbarum  Negative    38. Aspergillus mix  Negative    39. Penicillium mix  Negative    40. Bipolaris sorokiniana (Helminthosporium)  Negative    41. Drechslera spicifera (Curvularia)  Negative    42. Mucor plumbeus  Negative    43. Fusarium moniliforme  Negative    44. Aureobasidium pullulans (pullulara)  Negative    45. Rhizopus oryzae  Negative    46. Botrytis cinera  Negative    47. Epicoccum nigrum  Negative    48. Phoma betae  Negative    49. Candida Albicans  Negative    50. Trichophyton mentagrophytes  Negative    51. Mite, D Farinae  5,000 AU/ml  Negative    52. Mite, D Pteronyssinus  5,000 AU/ml  Negative    53. Cat Hair 10,000 BAU/ml  Negative    54.  Dog Epithelia  Negative    55. Mixed Feathers  Negative    56. Horse Epithelia  Negative    57. Cockroach, German  Negative    58. Mouse  Negative    59. Tobacco Leaf  Negative     Intradermal - 05/01/19 1504    Time Antigen Placed  1500    Allergen Manufacturer  Lavella Hammock    Location  Arm    Number of Test  15    Control  Negative    [place]  Negative    Johnson  Negative    7 Grass  Negative    Ragweed mix  Negative    Weed mix  Negative    Tree mix  Negative    Mold 1  Negative    Mold 2  Negative    Mold 3  Negative    Mold 4  Negative    Cat  Negative    Dog  Negative    Cockroach  Negative    Mite mix  Negative       Past Medical History: Patient Active Problem List    Diagnosis Date Noted  . Chronic rhinitis 05/01/2019  . Heartburn 05/01/2019  . Morbid obesity ([place]) 01/24/2015  . PCP NOTES >>>  12/20/2014  . Aortic root enlargement ([place]) 08/13/2012  . Atrial tachycardia-nonsustained 07/23/2012  . OSA (obstructive sleep apnea) 09/05/2011  . Annual physical exam 04/26/2011  . Depression 01/24/2011  . HYPOGONADISM 03/24/2010  . RHINITIS 06/22/2009  . DMII (diabetes mellitus, type 2) ([place]) 07/22/2006  . GERD 07/22/2006  . Hyperlipidemia 07/18/2006  . Essential hypertension 07/18/2006   Past Medical History:  Diagnosis Date  . Allergy   . Aortic root enlargement ([facility])    CT 42 mm 2014  . Chronic back pain   . Diabetes mellitus    type II  . DJD (degenerative joint disease)    per MRI of LS spine 2005  . GERD (gastroesophageal reflux disease)   . Hyperlipidemia   . Hypertension   . Hypogonadism male    dx w/ hypogonadism elsewhere ~ 2011  . Normal cardiac stress test    (-) stress test 2003, 2006  . S/P cardiac cath     04-2006 neg per pt--CP improved w/ PPIs  . Sleep apnea    uses cpap  . Tachycardia    ER 07-2012, ?SCR, on atenolol   Past Surgical History: Past Surgical History:  Procedure Laterality Date  . CARDIAC CATHETERIZATION    . CYSTECTOMY     from back  . POLYPECTOMY     Medication List:  Current Outpatient Medications  Medication Sig Dispense Refill  . aspirin 81 MG tablet Take 81 mg by mouth daily.      Marland Kitchen atenolol (TENORMIN) 50 MG tablet Take 0.5 tablets (25 mg total) by mouth daily. 45 tablet 3  . hydrochlorothiazide (HYDRODIURIL) 25 MG tablet Take 1 tablet (25 mg total) by mouth daily. 90 tablet 1  . JANUVIA 100 MG tablet TAKE 1 TABLET BY MOUTH EVERY DAY 30 tablet 5  . losartan (COZAAR) 25 MG tablet Take 1 tablet (25 mg total) by mouth daily. 30 tablet 6  . metFORMIN (GLUCOPHAGE) 850 MG tablet TAKE 2 TABLETS BY MOUTH WITH BREAKFAST AND 1 TABLET BY MOUTH WITH DINNER 90 tablet 3  . ONETOUCH ULTRA test strip CHECK BLOOD  SUGAR NO MORE THAN TWICE DAILY. DX IS E11.9. MAX ON INSURANCE 100 strip 12  . pioglitazone (ACTOS) 30 MG tablet Take 1 tablet (30 mg total) by mouth daily. 90 tablet 1  . pravastatin (PRAVACHOL) 20 MG tablet Take 1 tablet (20 mg total) by mouth daily. 30 tablet 5  . Azelastine-Fluticasone 137-50 MCG/ACT SUSP Place 1 spray into the nose in the morning and at bedtime. 23 g 5   No current facility-administered medications for this visit.   Allergies: No Known Allergies Social History: Social History   Socioeconomic History  . Marital status: Married    Spouse name: Not on file  . Number of children: 2  . Years of education: Not on file  . Highest education level: Not on file  Occupational History  . Occupation: TRUCK DRIVER    Employer: [employer]  Tobacco Use  . Smoking status: Former Smoker    Types: Cigars    Quit date: 03/12/1981    Years since quitting: 38.1  . Smokeless tobacco: Never Used  Substance and Sexual Activity  . Alcohol use: Yes    Alcohol/week: 0.0 standard drinks    Comment: occassinally  . Drug use: No  . Sexual activity: Not on file  Other Topics Concern  . Not on file  Social History Narrative   Truck driver (local); lives w/ wife   Social Determinants of Health  Financial Resource Strain:   . Difficulty of Paying Living Expenses: Not on file  Food Insecurity:   . Worried About Charity fundraiser in the Last Year: Not on file  . Ran Out of Food in the Last Year: Not on file  Transportation Needs:   . Lack of Transportation (Medical): Not on file  . Lack of Transportation (Non-Medical): Not on file  Physical Activity:   . Days of Exercise per Week: Not on file  . Minutes of Exercise per Session: Not on file  Stress:   . Feeling of Stress : Not on file  Social Connections:   . Frequency of Communication with Friends and Family: Not on file  . Frequency of Social Gatherings with Friends and Family: Not on file  . Attends Religious Services:  Not on file  . Active Member of Clubs or Organizations: Not on file  . Attends Archivist Meetings: Not on file  . Marital Status: Not on file   Lives in a 62 year old home. Smoking: denies Occupation: Facilities manager HistoryFreight forwarder in the house: no Charity fundraiser in the family room: yes Carpet in the bedroom: yes Heating: gas Cooling: central Pet: yes 1 cat x 2 yrs  Family History: Family History  Problem Relation Age of Onset  . Heart disease Father        CABG at age 17  . Colon polyps Brother   . Heart disease Brother        CABG  . Sinusitis Brother   . Diabetes Other        cousins  . Prostate cancer Brother 54  . Stomach cancer Paternal Uncle   . Bladder Cancer Paternal Uncle   . Colon cancer Neg Hx   . Rectal cancer Neg Hx   . Esophageal cancer Neg Hx    Problem                               Relation Asthma                                   No  Eczema                                No  Food allergy                          No  Allergic rhino conjunctivitis     No    Review of Systems  Constitutional: Negative for appetite change, chills, fever and unexpected weight change.  HENT: Positive for congestion, rhinorrhea and sneezing.   Eyes: Positive for itching.  Respiratory: Negative for cough, chest tightness, shortness of breath and wheezing.   Cardiovascular: Negative for chest pain.  Gastrointestinal: Negative for abdominal pain.  Genitourinary: Negative for difficulty urinating.  Skin: Negative for rash.  Allergic/Immunologic: Negative for environmental allergies and food allergies.  Neurological: Negative for headaches.   Objective: BP 106/74 (BP Location: Right Arm, Patient Position: Sitting, Cuff Size: Large)   Pulse 64   Temp 98.2 F (36.8 C) (Oral)   Resp 16   Ht 6\' 2"  (1.88 m)   Wt 274 lb 9.6 oz (124.6 kg)   SpO2 96%   BMI 35.26 kg/m  Body  mass index is 35.26 kg/m. Physical Exam  Constitutional: He is oriented to  person, place, and time. He appears well-developed and well-nourished.  HENT:  Head: Normocephalic and atraumatic.  Right Ear: External ear normal.  Left Ear: External ear normal.  Mouth/Throat: Oropharynx is clear and moist.  Right turbinate hypertrophy  Eyes: Conjunctivae and EOM are normal.  Cardiovascular: Normal rate, regular rhythm and normal heart sounds. Exam reveals no gallop and no friction rub.  No murmur heard. Pulmonary/Chest: Effort normal and breath sounds normal. He has no wheezes. He has no rales.  Abdominal: Soft.  Musculoskeletal:     Cervical back: Neck supple.  Neurological: He is alert and oriented to person, place, and time.  Skin: Skin is warm. No rash noted.  Psychiatric: He has a normal mood and affect. His behavior is normal.  Nursing note and vitals reviewed.  The plan was reviewed with the patient/family, and all questions/concerned were addressed.  It was my pleasure to see Avimael today and participate in his care. Please feel free to contact me with any questions or concerns.  Sincerely,  Rexene Alberts, DO Allergy & Immunology  [facility] office: [phone] [facility] office: [place] office: [phone]

## 2019-05-01 NOTE — Assessment & Plan Note (Signed)
Some heartburn symptoms but not taking any medications.  Monitor symptoms.  If not improved, will give handout on heartburn lifestyle modifications diet and do a trial of either famotidine or PPI as uncontrolled reflux/heartburn can aggravate sinus symptoms.

## 2019-05-07 ENCOUNTER — Other Ambulatory Visit: Payer: Self-pay | Admitting: Internal Medicine

## 2019-05-10 ENCOUNTER — Other Ambulatory Visit: Payer: Self-pay | Admitting: Internal Medicine

## 2019-05-25 ENCOUNTER — Other Ambulatory Visit: Payer: Self-pay

## 2019-05-26 ENCOUNTER — Encounter: Payer: Self-pay | Admitting: Internal Medicine

## 2019-05-26 ENCOUNTER — Ambulatory Visit (INDEPENDENT_AMBULATORY_CARE_PROVIDER_SITE_OTHER): Payer: 59 | Admitting: Internal Medicine

## 2019-05-26 ENCOUNTER — Other Ambulatory Visit: Payer: Self-pay

## 2019-05-26 VITALS — BP 127/77 | HR 66 | Temp 95.3°F | Resp 16 | Ht 74.0 in | Wt 274.2 lb

## 2019-05-26 DIAGNOSIS — E119 Type 2 diabetes mellitus without complications: Secondary | ICD-10-CM

## 2019-05-26 DIAGNOSIS — Z0000 Encounter for general adult medical examination without abnormal findings: Secondary | ICD-10-CM

## 2019-05-26 DIAGNOSIS — E785 Hyperlipidemia, unspecified: Secondary | ICD-10-CM

## 2019-05-26 LAB — COMPREHENSIVE METABOLIC PANEL
ALT: 16 U/L (ref 0–53)
AST: 14 U/L (ref 0–37)
Albumin: 4.2 g/dL (ref 3.5–5.2)
Alkaline Phosphatase: 54 U/L (ref 39–117)
BUN: 19 mg/dL (ref 6–23)
CO2: 32 mEq/L (ref 19–32)
Calcium: 9.5 mg/dL (ref 8.4–10.5)
Chloride: 98 mEq/L (ref 96–112)
Creatinine, Ser: 0.91 mg/dL (ref 0.40–1.50)
GFR: 84.5 mL/min (ref >60.00)
Glucose, Bld: 133 mg/dL — ABNORMAL HIGH (ref 70–99)
Potassium: 3.9 mEq/L (ref 3.5–5.1)
Sodium: 136 mEq/L (ref 135–145)
Total Bilirubin: 0.7 mg/dL (ref 0.2–1.2)
Total Protein: 6.8 g/dL (ref 6.0–8.3)

## 2019-05-26 LAB — CBC WITH DIFFERENTIAL/PLATELET
Basophils Absolute: 0.1 10*3/uL (ref 0.0–0.1)
Basophils Relative: 0.9 % (ref 0.0–3.0)
Eosinophils Absolute: 0.1 10*3/uL (ref 0.0–0.7)
Eosinophils Relative: 1.2 % (ref 0.0–5.0)
HCT: 39.8 % (ref 39.0–52.0)
Hemoglobin: 13.5 g/dL (ref 13.0–17.0)
Lymphocytes Relative: 15.9 % (ref 12.0–46.0)
Lymphs Abs: 1.2 10*3/uL (ref 0.7–4.0)
MCHC: 34 g/dL (ref 30.0–36.0)
MCV: 92.3 fl (ref 78.0–100.0)
Monocytes Absolute: 0.7 10*3/uL (ref 0.1–1.0)
Monocytes Relative: 9.2 % (ref 3.0–12.0)
Neutro Abs: 5.4 10*3/uL (ref 1.4–7.7)
Neutrophils Relative %: 72.8 % (ref 43.0–77.0)
Platelets: 273 10*3/uL (ref 150.0–400.0)
RBC: 4.31 Mil/uL (ref 4.22–5.81)
RDW: 12.5 % (ref 11.5–15.5)
WBC: 7.4 10*3/uL (ref 4.0–10.5)

## 2019-05-26 LAB — HEMOGLOBIN A1C: Hgb A1c MFr Bld: 6.8 % — ABNORMAL HIGH (ref 4.6–6.5)

## 2019-05-26 LAB — LIPID PANEL
Cholesterol: 164 mg/dL (ref 0–200)
HDL: 41.2 mg/dL (ref >39.00)
LDL Cholesterol: 103 mg/dL — ABNORMAL HIGH (ref 0–99)
NonHDL: 122.69
Total CHOL/HDL Ratio: 4
Triglycerides: 96 mg/dL (ref 0.0–149.0)
VLDL: 19.2 mg/dL (ref 0.0–40.0)

## 2019-05-26 NOTE — Patient Instructions (Signed)
GO TO THE LAB : Get the blood work     Teachey back for a checkup in 4 months, please make an appointment  Continue checking your blood pressures: BP GOAL is between 110/65 and  135/85. If it is consistently higher or lower, let me know

## 2019-05-26 NOTE — Progress Notes (Signed)
Pre visit review using our clinic review tool, if applicable. No additional management support is needed unless otherwise documented below in the visit note. 

## 2019-05-26 NOTE — Progress Notes (Signed)
Subjective:    Patient ID: Anthony [name], male    DOB: Apr 05, 1955, 64 y.o.   MRN: [id number]  DOS:  05/26/2019 Type of visit - description: CPX Since the last office visit, she is doing okay. We review his ambulatory CBGs and BPs.    Review of Systems    A 14 point review of systems is negative    Past Medical History:  Diagnosis Date  . Allergy   . Aortic root enlargement (HCC)    CT 42 mm 2014  . Chronic back pain   . Diabetes mellitus    type II  . DJD (degenerative joint disease)    per MRI of LS spine 2005  . GERD (gastroesophageal reflux disease)   . Hyperlipidemia   . Hypertension   . Hypogonadism male    dx w/ hypogonadism elsewhere ~ 2011  . Normal cardiac stress test    (-) stress test 2003, 2006  . S/P cardiac cath     04-2006 neg per pt--CP improved w/ PPIs  . Sleep apnea    uses cpap  . Tachycardia    ER 07-2012, ?SCR, on atenolol    Past Surgical History:  Procedure Laterality Date  . CARDIAC CATHETERIZATION    . CYSTECTOMY     from back  . POLYPECTOMY     Family History  Problem Relation Age of Onset  . Heart disease Father        CABG at age 52  . Colon polyps Brother   . Heart disease Brother        CABG  . Sinusitis Brother   . Diabetes Other        cousins  . Prostate cancer Brother 58  . Stomach cancer Paternal Uncle   . Bladder Cancer Paternal Uncle   . Colon cancer Neg Hx   . Rectal cancer Neg Hx   . Esophageal cancer Neg Hx      Allergies as of 05/26/2019   No Known Allergies     Medication List       Accurate as of May 26, 2019 11:59 PM. If you have any questions, ask your nurse or doctor.        aspirin 81 MG tablet Take 81 mg by mouth daily.   atenolol 50 MG tablet Commonly known as: TENORMIN Take 0.5 tablets (25 mg total) by mouth daily.   Azelastine-Fluticasone 137-50 MCG/ACT Susp Place 1 spray into the nose in the morning and at bedtime.   hydrochlorothiazide 25 MG tablet Commonly known as:  HYDRODIURIL Take 1 tablet (25 mg total) by mouth daily.   Januvia 100 MG tablet Generic drug: sitaGLIPtin TAKE 1 TABLET BY MOUTH EVERY DAY   losartan 25 MG tablet Commonly known as: COZAAR Take 1 tablet (25 mg total) by mouth daily.   metFORMIN 850 MG tablet Commonly known as: GLUCOPHAGE TAKE 2 TABLETS BY MOUTH WITH BREAKFAST AND 1 TABLET BY MOUTH WITH DINNER   OneTouch Ultra test strip Generic drug: glucose blood CHECK BLOOD SUGAR NO MORE THAN TWICE DAILY. DX IS E11.9. MAX ON INSURANCE   pioglitazone 30 MG tablet Commonly known as: ACTOS Take 1 tablet (30 mg total) by mouth daily.   pravastatin 20 MG tablet Commonly known as: PRAVACHOL Take 1 tablet (20 mg total) by mouth daily.          Objective:   Physical Exam BP 127/77 (BP Location: Left Arm, Patient Position: Sitting, Cuff Size: Normal)   Pulse 66  Temp (!) 95.3 F (35.2 C) (Temporal)   Resp 16   Ht 6\' 2"  (1.88 m)   Wt 274 lb 4 oz (124.4 kg)   SpO2 100%   BMI 35.21 kg/m  General: Well developed, NAD, BMI noted Neck: No  thyromegaly  HEENT:  Normocephalic . Face symmetric, atraumatic Lungs:  CTA B Normal respiratory effort, no intercostal retractions, no accessory muscle use. Heart: RRR,  no murmur.  Abdomen:  Not distended, soft, non-tender. No rebound or rigidity.   DM foot exam: No edema, good pedal pulses, pinprick examination normal Skin: Exposed areas without rash. Not pale. Not jaundice Neurologic:  alert & oriented X3.  Speech normal, gait appropriate for age and unassisted Strength symmetric and appropriate for age.  Psych: Cognition and judgment appear intact.  Cooperative with normal attention span and concentration.  Behavior appropriate. No anxious or depressed appearing.     Assessment      Assessment  DM HTN Hyperlipidemia CV Dr [name], next visit 08-2016 --Tachycardia : ER eval 2014,   rx atenolol --stress test 2003, 2006; Chest pain-2008,  (-) cardiac cath, sx decrease  with PPIs ---Aorta enlargment per CT  2014, saw surgery 12-2016, they will cont observation OSA , severe - on Cpap GERD DJD, Chronic back pain, MRI 2005 showed OA Hypogonadism DX 2011, 2013:  FSH, LH and prolactin, declined Endo referral + FH Prostate ca brother age 94    PLAN Here for CPX DM: Currently on Januvia, Metformin, Actos.  Diet has improved a little, not exercising as much as he would like.  Foot exam normal.  Last A1c 7.1, will recheck.  Add medication?  He will be somewhat reluctant to do. HTN: Ambulatory BPs typically okay, occasional high numbers.  Continue Tenormin, HCTZ, losartan. Hyperlipidemia: On Pravachol, check FLP. Aortic enlargement: Last visit with cardiology 02/2019, issue was noted to be stable by MRI 08/08/2018. RTC 4 months     This visit occurred during the SARS-CoV-2 public health emergency.  Safety protocols were in place, including screening questions prior to the visit, additional usage of staff PPE, and extensive cleaning of exam room while observing appropriate contact time as indicated for disinfecting solutions.

## 2019-05-27 NOTE — Assessment & Plan Note (Signed)
-  Tdap 05/2018 - zostavax 2014 - shingrex discussed before - pnm shot 2014; prevnar 05-2015 - covid vaccine: Benefits discussed -+ FH Prostate cancer:   DRE PSA 2020 wnl -CCS: s/p multiple  Cscopes,   11-2013,   02/2017 + polyps , next per GI   -FH CAD: ASA, pravastatin, control CV RF -diet  and exercise: Doing okay with diet, plans to exercise more.  Encouraged to do. -LABS: CMP, FLP, CBC, A1c

## 2019-05-27 NOTE — Assessment & Plan Note (Signed)
Here for CPX DM: Currently on Januvia, Metformin, Actos.  Diet has improved a little, not exercising as much as he would like.  Foot exam normal.  Last A1c 7.1, will recheck.  Add medication?  He will be somewhat reluctant to do. HTN: Ambulatory BPs typically okay, occasional high numbers.  Continue Tenormin, HCTZ, losartan. Hyperlipidemia: On Pravachol, check FLP. Aortic enlargement: Last visit with cardiology 02/2019, issue was noted to be stable by MRI 08/08/2018. RTC 4 months

## 2019-05-30 ENCOUNTER — Other Ambulatory Visit: Payer: Self-pay | Admitting: Internal Medicine

## 2019-06-15 ENCOUNTER — Encounter: Payer: Self-pay | Admitting: Family Medicine

## 2019-06-15 ENCOUNTER — Ambulatory Visit (INDEPENDENT_AMBULATORY_CARE_PROVIDER_SITE_OTHER): Payer: 59

## 2019-06-15 ENCOUNTER — Ambulatory Visit (INDEPENDENT_AMBULATORY_CARE_PROVIDER_SITE_OTHER): Payer: 59 | Admitting: Family Medicine

## 2019-06-15 ENCOUNTER — Other Ambulatory Visit: Payer: Self-pay

## 2019-06-15 ENCOUNTER — Other Ambulatory Visit: Payer: Self-pay | Admitting: Internal Medicine

## 2019-06-15 ENCOUNTER — Ambulatory Visit: Payer: Self-pay

## 2019-06-15 VITALS — BP 130/80 | HR 64 | Ht 74.0 in | Wt 275.0 lb

## 2019-06-15 DIAGNOSIS — M25561 Pain in right knee: Secondary | ICD-10-CM | POA: Diagnosis not present

## 2019-06-15 NOTE — Progress Notes (Signed)
Subjective:   I, Judy Pimple, am serving as a scribe for Dr. Lynne Leader.  CC: R Knee pain   HPI: Patient is a 64 year old male presenting with knee pain going on X3-4 weeks some days are worse than others. Patient thinks he may have twisted it. Patient rates pain 5-7/10 and describes pain as throbbing in nature. Locates the pain to lateral knee.  Patient works as a Barrister's clerk doing deliveries most days.  He is to get in another truck multiple times per day.  He notes kneeling and sometimes getting out of the truck is painful.  Radiating pain: yes down to the shin Swelling: Yes in the back of knee sometimes LE numbness/tingling: no  LE weakness: no  Aggravating factors: to put pressure on it  Treatments tried: tylenol and aleve   Pertinent review of Systems: No fevers or chills  Relevant historical information: Hypertension, diabetes, obesity   Objective:    Vitals:   06/15/19 1057  BP: 130/80  Pulse: 64  SpO2: 98%   General: Well Developed, well nourished, and in no acute distress.   MSK: Right knee: Mild effusion otherwise normal-appearing Range of motion 0-120 degrees with crepitation. Tender palpation lateral joint line. Stable ligamentous exam. Mildly positive lateral McMurray's test. Intact strength. Minimally antalgic gait.  Lab and Radiology Results  X-ray images right knee obtained today personally and independently reviewed Mild medial compartment DJD no fractures. Await formal radiology review  Diagnostic Limited MSK Ultrasound of: Right knee Quad tendon mild effusion otherwise normal. Patellar tendon normal-appearing Medial joint line slightly narrowed. Lateral joint line narrowed.  Linear hyperechoic streak through mid substance of meniscus indicating probable tear No Baker's cyst Normal bony structures otherwise Impression: Probable lateral meniscus tear  Procedure: Real-time Ultrasound Guided Injection of right knee Device:  Philips Affiniti 50G Images permanently stored and available for review in the ultrasound unit. Verbal informed consent obtained.  Discussed risks and benefits of procedure. Warned about infection bleeding damage to structures skin hypopigmentation and fat atrophy among others. Patient expresses understanding and agreement Time-out conducted.   Noted no overlying erythema, induration, or other signs of local infection.   Skin prepped in a sterile fashion.   Local anesthesia: Topical Ethyl chloride.   With sterile technique and under real time ultrasound guidance:  40 mg of Kenalog and 3 mL of Marcaine injected easily.   Completed without difficulty   Pain immediately resolved suggesting accurate placement of the medication.   Advised to call if fevers/chills, erythema, induration, drainage, or persistent bleeding.   Images permanently stored and available for review in the ultrasound unit.  Impression: Technically successful ultrasound guided injection.         Impression and Recommendations:    Assessment and Plan: 64 y.o. male with right knee pain occurring after twisting his knee about 3 to 4 weeks ago.  Exam and ultrasound consistent with meniscus injury. Plan to treat conservatively with trial of Voltaren gel and injection as above.  Also recommend compressive sleeve during activity.  Recheck if not improved.  Precautions reviewed with patient expresses understanding agreement.Marland Kitchen  PDMP not reviewed this encounter. Orders Placed This Encounter  Procedures  . [place] LIMITED JOINT SPACE STRUCTURES LOW RIGHT(NO LINKED CHARGES)    Order Specific Question:   Reason for Exam (SYMPTOM  OR DIAGNOSIS REQUIRED)    Answer:   right knee pain    Order Specific Question:   Preferred imaging location?    Answer:  [facility]  . DG Knee AP/LAT W/Sunrise Right    Standing Status:   Future    Number of Occurrences:   1    Standing Expiration Date:   08/14/2020    Order  Specific Question:   Reason for Exam (SYMPTOM  OR DIAGNOSIS REQUIRED)    Answer:   eval knee pain lateral    Order Specific Question:   Preferred imaging location?    Answer:   [facility]    Order Specific Question:   Radiology Contrast Protocol - do NOT remove file path    Answer:   [email or web address]   No orders of the defined types were placed in this encounter.   Discussed warning signs or symptoms. Please see discharge instructions. Patient expresses understanding.   The above documentation has been reviewed and is accurate and complete Lynne Leader

## 2019-06-15 NOTE — Patient Instructions (Signed)
Thank you for coming in today. Call or go to the ER if you develop a large red swollen joint with extreme pain or oozing puss.  Try over the counter voltaren gel up to 4x daily for knee pain.  OK to use also a compression sleeve during activity and for 30-60 mins following activity.  Body Helix Full Knee Sleeve.  This will be most helpful if you feel like you knee is swollen.   Keep me updated.  If not doing well let me know.

## 2019-06-16 NOTE — Progress Notes (Signed)
Right knee x-ray looks pretty normal

## 2019-06-17 ENCOUNTER — Encounter: Payer: Self-pay | Admitting: Family Medicine

## 2019-06-19 ENCOUNTER — Ambulatory Visit: Payer: 59 | Admitting: Family Medicine

## 2019-06-24 ENCOUNTER — Other Ambulatory Visit: Payer: Self-pay | Admitting: Internal Medicine

## 2019-07-03 ENCOUNTER — Ambulatory Visit: Payer: 59 | Admitting: Allergy

## 2019-07-20 ENCOUNTER — Other Ambulatory Visit: Payer: Self-pay | Admitting: Thoracic Surgery (Cardiothoracic Vascular Surgery)

## 2019-07-20 DIAGNOSIS — I712 Thoracic aortic aneurysm, without rupture, unspecified: Secondary | ICD-10-CM

## 2019-08-09 ENCOUNTER — Other Ambulatory Visit: Payer: Self-pay | Admitting: Internal Medicine

## 2019-08-21 ENCOUNTER — Ambulatory Visit
Admission: RE | Admit: 2019-08-21 | Discharge: 2019-08-21 | Disposition: A | Discharge status: Home / Self Care | Payer: 59 | Source: Ambulatory Visit | Attending: Thoracic Surgery (Cardiothoracic Vascular Surgery) | Admitting: Thoracic Surgery (Cardiothoracic Vascular Surgery)

## 2019-08-21 ENCOUNTER — Other Ambulatory Visit: Payer: Self-pay

## 2019-08-21 DIAGNOSIS — I712 Thoracic aortic aneurysm, without rupture, unspecified: Secondary | ICD-10-CM

## 2019-08-21 MED ORDER — GADOBENATE DIMEGLUMINE 529 MG/ML IV SOLN
20.0000 mL | Freq: Once | INTRAVENOUS | Status: AC | PRN
  Administered 2019-08-21: 20 mL via INTRAVENOUS

## 2019-08-25 ENCOUNTER — Other Ambulatory Visit: Payer: Self-pay

## 2019-08-25 ENCOUNTER — Encounter: Payer: Self-pay | Admitting: Thoracic Surgery (Cardiothoracic Vascular Surgery)

## 2019-08-25 ENCOUNTER — Ambulatory Visit: Payer: 59 | Admitting: Thoracic Surgery (Cardiothoracic Vascular Surgery)

## 2019-08-25 VITALS — BP 134/87 | HR 66 | Temp 97.2°F | Resp 18 | Ht 75.0 in | Wt 279.0 lb

## 2019-08-25 DIAGNOSIS — I77810 Thoracic aortic ectasia: Secondary | ICD-10-CM | POA: Diagnosis not present

## 2019-08-25 DIAGNOSIS — I712 Thoracic aortic aneurysm, without rupture, unspecified: Secondary | ICD-10-CM | POA: Insufficient documentation

## 2019-08-25 NOTE — Progress Notes (Signed)
[address]       [place] [address]             [phone]     HPI: [name] returns for follow-up regarding a dilated aortic root.  Rodricus Candelaria is a 64 year old man with past medical history significant for hypertension, hyperlipidemia, reflux, type 2 diabetes without complication, sleep apnea requiring CPAP, and a dilated aortic root.  He was first noted to have a dilated aortic root on echocardiogram in 2014.  A CT angiogram showed to be 4.2 cm.  Has been followed with MRIs since then and it has been measured as large as 4.8 cm but my measurements never been more than 4.6 cm.  He has been feeling well.  Is not having any chest pain, pressure, tightness, or shortness of breath.  Past Medical History:  Diagnosis Date  . Allergy   . Aortic root enlargement (HCC)    CT 42 mm 2014  . Chronic back pain   . Diabetes mellitus    type II  . DJD (degenerative joint disease)    per MRI of LS spine 2005  . GERD (gastroesophageal reflux disease)   . Hyperlipidemia   . Hypertension   . Hypogonadism male    dx w/ hypogonadism elsewhere ~ 2011  . Normal cardiac stress test    (-) stress test 2003, 2006  . S/P cardiac cath     04-2006 neg per pt--CP improved w/ PPIs  . Sleep apnea    uses cpap  . Tachycardia    ER 07-2012, ?SCR, on atenolol    Current Outpatient Medications  Medication Sig Dispense Refill  . aspirin 81 MG tablet Take 81 mg by mouth daily.      Marland Kitchen atenolol (TENORMIN) 50 MG tablet Take 0.5 tablets (25 mg total) by mouth daily. 45 tablet 3  . Azelastine-Fluticasone 137-50 MCG/ACT SUSP Place 1 spray into the nose in the morning and at bedtime. 23 g 5  . hydrochlorothiazide (HYDRODIURIL) 25 MG tablet Take 1 tablet (25 mg total) by mouth daily. 90 tablet 1  . JANUVIA 100 MG tablet TAKE 1 TABLET BY MOUTH EVERY DAY 30 tablet 5  . losartan (COZAAR) 25 MG tablet Take 1 tablet (25 mg total) by mouth daily. 30 tablet 3  . metFORMIN (GLUCOPHAGE) 850 MG tablet  TAKE 2 TABLETS BY MOUTH WITH BREAKFAST AND 1 TABLET BY MOUTH WITH DINNER 90 tablet 1  . ONETOUCH ULTRA test strip CHECK BLOOD SUGAR NO MORE THAN TWICE DAILY. DX IS E11.9. MAX ON INSURANCE 100 strip 12  . pioglitazone (ACTOS) 30 MG tablet Take 1 tablet (30 mg total) by mouth daily. 30 tablet 5  . pravastatin (PRAVACHOL) 20 MG tablet Take 1 tablet (20 mg total) by mouth daily. 30 tablet 5   No current facility-administered medications for this visit.    Physical Exam BP 134/87 (BP Location: Left Arm, Patient Position: Sitting, Cuff Size: Normal)   Pulse 66   Temp (!) 97.2 F (36.2 C)   Resp 18   Ht 6\' 3"  (1.905 m)   Wt 279 lb (126.6 kg)   SpO2 95% Comment: RA  BMI 34.45 kg/m  64 year old man in no acute distress Alert and oriented with no motor deficits No carotid bruits Cardiac regular rate and rhythm with a normal S1 and S2, no rubs, murmurs or gallops Lungs clear with equal breath sounds bilaterally  Diagnostic Tests: MRA CHEST WITH OR WITHOUT CONTRAST  TECHNIQUE: Angiographic images of the chest were obtained using MRA technique without and with intravenous contrast.  CONTRAST:  62mL MULTIHANCE GADOBENATE DIMEGLUMINE 529 MG/ML IV SOLN  COMPARISON:  08/08/2018  FINDINGS: VASCULAR  Aorta: Aortic root is prominent measuring up to 4.3 cm. Aortic root has not significant change since the previous exam but limited evaluation on this non ECG gated examination. Ascending thoracic aorta is stable measuring 3.3 cm. Typical three-vessel arch anatomy. Great vessels are patent. Proximal descending thoracic aorta measures 3.2 cm and stable. No evidence for an aortic dissection. Distal descending thoracic aorta is tortuous.  Heart: Heart size is normal.  No significant pericardial fluid.  Pulmonary Arteries:  Main pulmonary arteries are patent.  Other: Central venous structures including the SVC and azygos vein are patent.  NON-VASCULAR  No large pleural  effusions. No gross abnormality in the lungs but limited evaluation for parenchymal lung disease. Visualized portions of the upper abdomen are unremarkable. Mediastinal structures are unremarkable. Bone structures are unremarkable.  IMPRESSION: Stable MRA of the chest. Prominent aortic root measuring roughly 4.3 cm. No evidence for aneurysm involving the ascending or descending thoracic aorta. Limited evaluation of the aortic root on this non ECG gated examination.   Electronically Signed   By: Markus Daft M.D.   On: 08/21/2019 13:05 I personally reviewed the MRI images and concur with the findings noted above  Impression: Anthony [name] is a 64 year old man with past medical history significant for hypertension, hyperlipidemia, reflux, type 2 diabetes without complication, sleep apnea requiring CPAP, and a dilated aortic root.  He has been followed for dilated aortic root dating back to 2014.  Dilated aortic root-stable.  Actually measured slightly smaller but that is probably technical in nature.  Regardless, there has been no interval enlargement.  Needs continued annual follow-up.  No aneurysm of the ascending or descending aorta.  Hypertension-blood pressure well controlled on current regimen.  There are no thoracic surgical or aortic problems that would interfere with his ability to work.  Plan: Return in 1 year with MR angio of chest to follow-up dilated aortic root  Spent 20 minutes in review of records, review of images, and in consultation with Mr. Buchan today. Melrose Nakayama, MD [facility] [phone]

## 2019-09-07 ENCOUNTER — Ambulatory Visit: Payer: 59 | Admitting: Family Medicine

## 2019-09-07 ENCOUNTER — Encounter: Payer: Self-pay | Admitting: Family Medicine

## 2019-09-07 ENCOUNTER — Other Ambulatory Visit: Payer: Self-pay

## 2019-09-07 ENCOUNTER — Ambulatory Visit: Payer: Self-pay

## 2019-09-07 VITALS — BP 92/64 | HR 70 | Ht 75.0 in | Wt 279.4 lb

## 2019-09-07 DIAGNOSIS — M25561 Pain in right knee: Secondary | ICD-10-CM | POA: Diagnosis not present

## 2019-09-07 NOTE — Patient Instructions (Addendum)
Thank you for coming in today. Call or go to the ER if you develop a large red swollen joint with extreme pain or oozing puss.  This is ok to do until September.  Plan for MRI.  Recheck as needed.

## 2019-09-07 NOTE — Progress Notes (Signed)
I, Anthony [name], LAT, ATC, am serving as scribe for Dr. Lynne [name].  Anthony [name] is a 64 y.o. male who presents to [facility] today for f/u of R lateral knee pain.  He was last seen by Dr. Georgina Snell on 06/15/19 and had a R knee injection.  He was also advised to purchase and wear a knee compression sleeve.  Since his last visit, pt reports that his R knee pain was improved until last week when the pain returned.  He would like to have another injection today.   He understands that his knee is basically wearing out and he may need either meniscus debridement surgery or knee replacement the future.  He notes that he rather does have a knee replacement then deal with that treatment and then a knee replacement soon thereafter.  His goal is to keep his knee under control until September when his workload will diminish.  He also notes his blood pressure is a bit low today when measured.  He feels fine with no lightheadedness or dizziness.  Diagnostic imaging: R knee XR- 06/15/19  Pertinent review of systems: No fevers or chills  Relevant historical information: Hypertension and thoracic aortic aneurysm without rupture.   Exam:  BP 92/64 (BP Location: Left Arm, Patient Position: Sitting, Cuff Size: Large)   Pulse 70   Ht 6\' 3"  (1.905 m)   Wt 279 lb 6.4 oz (126.7 kg)   SpO2 96%   BMI 34.92 kg/m  General: Well Developed, well nourished, and in no acute distress.   MSK: Right knee moderate effusion normal-appearing otherwise. Tender palpation lateral joint line. Normal motion with crepitation. Stable ligamentous exam.    Lab and Radiology Results  Procedure: Real-time Ultrasound Guided Injection of right knee Device: Philips Affiniti 50G Images permanently stored and available for review in the ultrasound unit. Verbal informed consent obtained.  Discussed risks and benefits of procedure. Warned about infection bleeding damage to structures skin  hypopigmentation and fat atrophy among others. Patient expresses understanding and agreement Time-out conducted.   Noted no overlying erythema, induration, or other signs of local infection.   Skin prepped in a sterile fashion.   Local anesthesia: Topical Ethyl chloride.   With sterile technique and under real time ultrasound guidance:  40 mg of Kenalog and 2 mL of Marcaine injected easily.   Completed without difficulty   Pain immediately resolved suggesting accurate placement of the medication.   Advised to call if fevers/chills, erythema, induration, drainage, or persistent bleeding.   Images permanently stored and available for review in the ultrasound unit.  Impression: Technically successful ultrasound guided injection.    EXAM: RIGHT KNEE 3 VIEWS  COMPARISON:  None.  FINDINGS: No evidence of fracture, dislocation, or joint effusion. No evidence of arthropathy or other focal bone abnormality. Soft tissues are unremarkable.  IMPRESSION: No acute abnormality noted.   Electronically Signed   By: Inez Catalina M.D.   On: 06/15/2019 16:25  I, Anthony [name], personally (independently) visualized and performed the interpretation of the images attached in this note.      Assessment and Plan: 64 y.o. male with right knee pain thought to be due to lateral meniscus and DJD worse than is visible on x-ray.  Plan for injection today however we will proceed with MRI for potential surgical planning.  Recheck back as needed.  Will relay results via [place] or phone unless complicated in which case in person visit would be helpful.  PDMP not reviewed this encounter. Orders Placed This Encounter  Procedures  . [place] LIMITED JOINT SPACE STRUCTURES LOW LEFT(NO LINKED CHARGES)    Order Specific Question:   Reason for Exam (SYMPTOM  OR DIAGNOSIS REQUIRED)    Answer:   R knee pain    Order Specific Question:   Preferred imaging location?    Answer:   [facility]  . MR Knee Right Wo Contrast    Standing Status:   Future    Standing Expiration Date:   09/06/2020    Order Specific Question:   What is the patient's sedation requirement?    Answer:   No Sedation    Order Specific Question:   Does the patient have a pacemaker or implanted devices?    Answer:   No    Order Specific Question:   Preferred imaging location?    Answer:   [facility] (table limit-550lbs)    Order Specific Question:   Radiology Contrast Protocol - do NOT remove file path    Answer:   \\[email or web address]   No orders of the defined types were placed in this encounter.    Discussed warning signs or symptoms. Please see discharge instructions. Patient expresses understanding.   The above documentation has been reviewed and is accurate and complete Anthony [name], M.D.

## 2019-09-14 ENCOUNTER — Other Ambulatory Visit: Payer: Self-pay | Admitting: Internal Medicine

## 2019-09-25 ENCOUNTER — Other Ambulatory Visit: Payer: Self-pay | Admitting: Internal Medicine

## 2019-09-25 ENCOUNTER — Ambulatory Visit: Payer: 59 | Admitting: Internal Medicine

## 2019-09-27 ENCOUNTER — Other Ambulatory Visit: Payer: Self-pay | Admitting: Internal Medicine

## 2019-09-29 ENCOUNTER — Ambulatory Visit
Admission: RE | Admit: 2019-09-29 | Discharge: 2019-09-29 | Disposition: A | Discharge status: Home / Self Care | Payer: 59 | Source: Ambulatory Visit | Attending: Family Medicine | Admitting: Family Medicine

## 2019-09-29 ENCOUNTER — Other Ambulatory Visit: Payer: Self-pay

## 2019-09-29 DIAGNOSIS — M25561 Pain in right knee: Secondary | ICD-10-CM

## 2019-09-30 NOTE — Progress Notes (Signed)
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  This is probably contributing to your knee pain.  Reasonable to proceed with surgical consultation here.  It is possible that a smaller meniscus type surgery would help instead of a full total knee replacement.  Would you like me to place a referral to surgery now?

## 2019-10-02 ENCOUNTER — Ambulatory Visit: Payer: 59 | Admitting: Internal Medicine

## 2019-10-02 ENCOUNTER — Telehealth: Payer: Self-pay | Admitting: Physical Therapy

## 2019-10-02 ENCOUNTER — Encounter: Payer: Self-pay | Admitting: Internal Medicine

## 2019-10-02 ENCOUNTER — Other Ambulatory Visit: Payer: Self-pay

## 2019-10-02 VITALS — BP 119/76 | HR 54 | Temp 98.1°F | Resp 18 | Ht 75.0 in | Wt 272.0 lb

## 2019-10-02 DIAGNOSIS — I10 Essential (primary) hypertension: Secondary | ICD-10-CM | POA: Diagnosis not present

## 2019-10-02 DIAGNOSIS — M1711 Unilateral primary osteoarthritis, right knee: Secondary | ICD-10-CM

## 2019-10-02 DIAGNOSIS — S83281A Other tear of lateral meniscus, current injury, right knee, initial encounter: Secondary | ICD-10-CM

## 2019-10-02 DIAGNOSIS — E119 Type 2 diabetes mellitus without complications: Secondary | ICD-10-CM

## 2019-10-02 DIAGNOSIS — M25561 Pain in right knee: Secondary | ICD-10-CM

## 2019-10-02 LAB — BASIC METABOLIC PANEL
BUN: 17 mg/dL (ref 6–23)
CO2: 30 mEq/L (ref 19–32)
Calcium: 9.2 mg/dL (ref 8.4–10.5)
Chloride: 103 mEq/L (ref 96–112)
Creatinine, Ser: 0.88 mg/dL (ref 0.40–1.50)
GFR: 87.74 mL/min (ref >60.00)
Glucose, Bld: 110 mg/dL — ABNORMAL HIGH (ref 70–99)
Potassium: 4.1 mEq/L (ref 3.5–5.1)
Sodium: 139 mEq/L (ref 135–145)

## 2019-10-02 LAB — HEMOGLOBIN A1C: Hgb A1c MFr Bld: 7 % — ABNORMAL HIGH (ref 4.6–6.5)

## 2019-10-02 NOTE — Addendum Note (Signed)
Addended by: Gregor Hams on: 10/02/2019 12:10 PM   Modules accepted: Orders

## 2019-10-02 NOTE — Telephone Encounter (Signed)
Order placed.  Patient should hear from [facility] Ortho care soon.

## 2019-10-02 NOTE — Telephone Encounter (Signed)
Called pt and relayed his knee MRI results.  Pt states that he would like a referral to Ortho.

## 2019-10-02 NOTE — Patient Instructions (Signed)
You are doing great.   GO TO THE LAB : Get the blood work     [place], [place] back for a checkup in 6 months

## 2019-10-02 NOTE — Progress Notes (Signed)
Subjective:    Patient ID: Anthony [name], male    DOB: [birth year], 64 y.o.   MRN: [id number]  DOS:  10/02/2019 Type of visit - description: Routine visit Since the last office visit he is doing great with diet. Using his CPAP regularly Ambulatory BPs okay.   Wt Readings from Last 3 Encounters:  10/02/19 (!) 272 lb (123.4 kg)  09/07/19 279 lb 6.4 oz (126.7 kg)  08/25/19 279 lb (126.6 kg)     Review of Systems Having occasional diarrhea and constipation, no blood in the stools.  Symptoms started ~ the time he was RX Metformin.  Past Medical History:  Diagnosis Date  . Allergy   . Aortic root enlargement (HCC)    CT 42 mm 2014  . Chronic back pain   . Diabetes mellitus    type II  . DJD (degenerative joint disease)    per MRI of LS spine 2005  . GERD (gastroesophageal reflux disease)   . Hyperlipidemia   . Hypertension   . Hypogonadism male    dx w/ hypogonadism elsewhere ~ 2011  . Normal cardiac stress test    (-) stress test 2003, 2006  . S/P cardiac cath     04-2006 neg per pt--CP improved w/ PPIs  . Sleep apnea    uses cpap  . Tachycardia    ER 07-2012, ?SCR, on atenolol    Past Surgical History:  Procedure Laterality Date  . CARDIAC CATHETERIZATION    . CYSTECTOMY     from back  . POLYPECTOMY      Allergies as of 10/02/2019   No Known Allergies     Medication List       Accurate as of October 02, 2019 11:59 PM. If you have any questions, ask your nurse or doctor.        aspirin 81 MG tablet Take 81 mg by mouth daily.   atenolol 50 MG tablet Commonly known as: TENORMIN Take 0.5 tablets (25 mg total) by mouth daily.   Azelastine-Fluticasone 137-50 MCG/ACT Susp Place 1 spray into the nose in the morning and at bedtime.   hydrochlorothiazide 25 MG tablet Commonly known as: HYDRODIURIL Take 1 tablet (25 mg total) by mouth daily.   losartan 25 MG tablet Commonly known as: COZAAR Take 1 tablet (25 mg total) by mouth daily.   metFORMIN 850 MG  tablet Commonly known as: GLUCOPHAGE TAKE 2 TABLETS BY MOUTH WITH BREAKFAST AND 1 TABLET BY MOUTH WITH DINNER   OneTouch Ultra test strip Generic drug: glucose blood CHECK BLOOD SUGAR NO MORE THAN TWICE DAILY. DX IS E11.9. MAX ON INSURANCE   pioglitazone 30 MG tablet Commonly known as: ACTOS Take 1 tablet (30 mg total) by mouth daily.   pravastatin 20 MG tablet Commonly known as: PRAVACHOL Take 1 tablet (20 mg total) by mouth daily.   sitaGLIPtin 100 MG tablet Commonly known as: Januvia Take 1 tablet (100 mg total) by mouth daily.          Objective:   Physical Exam BP 119/76 (BP Location: Left Arm, Patient Position: Sitting, Cuff Size: Normal)   Pulse 54   Temp 98.1 F (36.7 C) (Oral)   Resp 18   Ht 6\' 3"  (1.905 m)   Wt (!) 272 lb (123.4 kg)   SpO2 97%   BMI 34.00 kg/m   General:   Well developed, NAD, BMI noted. HEENT:  Normocephalic . Face symmetric, atraumatic Lungs:  CTA B Normal respiratory effort, no  intercostal retractions, no accessory muscle use. Heart: RRR,  no murmur.  Lower extremities: no pretibial edema bilaterally  Skin: Not pale. Not jaundice Neurologic:  alert & oriented X3.  Speech normal, gait appropriate for age and unassisted Psych--  Cognition and judgment appear intact.  Cooperative with normal attention span and concentration.  Behavior appropriate. No anxious or depressed appearing.      Assessment      Assessment  DM HTN Hyperlipidemia CV Dr [name], next visit 08-2016 --Tachycardia : ER eval 2014,   rx atenolol --stress test 2003, 2006; Chest pain-2008,  (-) cardiac cath, sx decrease with PPIs ---Aorta enlargment per CT  2014, saw surgery 12-2016, they will cont observation OSA , severe - on Cpap GERD DJD, Chronic back pain, MRI 2005 showed OA Hypogonadism DX 2011, 2013:  FSH, LH and prolactin, declined Endo referral + FH Prostate ca brother age 37    PLAN DM: Doing great with diet, has lost 14 pounds in the last few  months, Continue Metformin, Actos, Januvia.  Check a A1c.  If possible the patient would like to DC Metformin due to GI side effects HTN: On Tenormin, losartan.  Ambulatory BPs are great.  Check a BMP, no change GI: Has occasional diarrhea and constipation, he thinks related to Metformin, last colonoscopy 2018, next 02/2020.  Observation, DC Metformin? Had Covid vaccinations, encouraged flu shot every year. RTC 6 months  This visit occurred during the SARS-CoV-2 public health emergency.  Safety protocols were in place, including screening questions prior to the visit, additional usage of staff PPE, and extensive cleaning of exam room while observing appropriate contact time as indicated for disinfecting solutions.

## 2019-10-02 NOTE — Progress Notes (Signed)
Pre visit review using our clinic review tool, if applicable. No additional management support is needed unless otherwise documented below in the visit note. 

## 2019-10-04 NOTE — Assessment & Plan Note (Signed)
DM: Doing great with diet, has lost 14 pounds in the last few months, Continue Metformin, Actos, Januvia.  Check a A1c.  If possible the patient would like to DC Metformin due to GI side effects HTN: On Tenormin, losartan.  Ambulatory BPs are great.  Check a BMP, no change GI: Has occasional diarrhea and constipation, he thinks related to Metformin, last colonoscopy 2018, next 02/2020.  Observation, DC Metformin? Had Covid vaccinations, encouraged flu shot every year. RTC 6 months

## 2019-10-06 ENCOUNTER — Telehealth: Payer: Self-pay | Admitting: Family Medicine

## 2019-10-06 NOTE — Telephone Encounter (Signed)
Patient called asking if his orthopedic surgery referral could be sent to Dr Dorna Leitz at [facility].

## 2019-10-06 NOTE — Telephone Encounter (Signed)
Referral sent 

## 2019-10-16 ENCOUNTER — Other Ambulatory Visit: Payer: 59

## 2019-10-16 ENCOUNTER — Ambulatory Visit: Payer: 59 | Admitting: Orthopedic Surgery

## 2019-10-19 ENCOUNTER — Other Ambulatory Visit: Payer: Self-pay | Admitting: Internal Medicine

## 2019-10-20 ENCOUNTER — Other Ambulatory Visit: Payer: Self-pay | Admitting: Internal Medicine

## 2019-10-22 ENCOUNTER — Encounter: Payer: Self-pay | Admitting: Internal Medicine

## 2019-10-22 ENCOUNTER — Encounter: Payer: Self-pay | Admitting: Thoracic Surgery (Cardiothoracic Vascular Surgery)

## 2019-10-23 ENCOUNTER — Telehealth: Payer: Self-pay | Admitting: *Deleted

## 2019-10-23 NOTE — Telephone Encounter (Signed)
° °  [facility] Pre-operative Risk Assessment    HEARTCARE STAFF: - Please ensure there is not already an duplicate clearance open for this procedure. - Under Visit Info/Reason for Call, type in Other and utilize the format Clearance MM/DD/YY or Clearance TBD. Do not use dashes or single digits. - If request is for dental extraction, please clarify the # of teeth to be extracted.  Request for surgical clearance:  1. What type of surgery is being performed? RIGHT KNEE ARTHROSCOPY   2. When is this surgery scheduled? 11/04/19   3. What type of clearance is required (medical clearance vs. Pharmacy clearance to hold med vs. Both)? MEDICAL  4. Are there any medications that need to be held prior to surgery and how [place]? ASA    5. Practice name and name of physician performing surgery? [facility]; DR. Jenny Reichmann [place]   6. What is the office phone number? [phone]   7.   What is the office fax number? [phone]  8.   Anesthesia type (None, local, MAC, general) ? CHOICE   Julaine Hua 10/23/2019, 12:11 PM  _________________________________________________________________   (provider comments below)

## 2019-10-23 NOTE — Telephone Encounter (Signed)
Please call pt to schedule appt for clearance per Jesse Cleaver, NP. 

## 2019-10-23 NOTE — Telephone Encounter (Signed)
Primary Cardiologist:Peter [name], MD  Chart reviewed as part of pre-operative protocol coverage. Because of Anthony [name] past medical history and time since last visit, he/she will require a follow-up visit in order to better assess preoperative cardiovascular risk.  Pre-op covering staff: - Please schedule appointment and call patient to inform them. - Please contact requesting surgeon's office via preferred method (i.e, phone, fax) to inform them of need for appointment prior to surgery.  If applicable, this message will also be routed to pharmacy pool and/or primary cardiologist for input on holding anticoagulant/antiplatelet agent as requested below so that this information is available at time of patient's appointment.   Anthony Pelton, NP  10/23/2019, 1:37 PM

## 2019-10-26 ENCOUNTER — Telehealth: Payer: Self-pay

## 2019-10-26 NOTE — Telephone Encounter (Signed)
Received fax confirmation

## 2019-10-26 NOTE — Telephone Encounter (Signed)
Surgical clearance form received from [facility]. Pt needing R knee arthroscopy w/ Dr. Dorna Leitz- he is scheduled for surgery on 11/04/2019. His last OV was 10/02/2019- looks like cardiology has received a clearance as well. Form placed in PCP red folder.

## 2019-10-26 NOTE — Telephone Encounter (Signed)
Form faxed to [facility] at [phone]. Form sent for scanning.

## 2019-10-26 NOTE — Telephone Encounter (Signed)
Advise patient: Medical clearance provided as Anthony [name] as he does not have symptoms such as chest pain, difficulty breathing. Okay to stop aspirin from my standpoint.  Needs cardiac clearance.

## 2019-10-26 NOTE — Telephone Encounter (Signed)
Pt scheduled for clearance appt with Truitt Merle, NP on Monday, 8/23 at 3:15 PM. Pt surgery scheduled for 8/25.

## 2019-10-27 NOTE — Progress Notes (Deleted)
CARDIOLOGY OFFICE NOTE  Date:  10/28/2019    Anthony [name] Date of Birth: June 20, 1955 Medical Record #[id number]  PCP:  Anthony Branch, MD  Cardiologist:  Anthony [name]  No chief complaint on file.   History of Present Illness: Anthony [name] is a 64 y.o. male who presents today for a preop visit. Seen for Dr. Johnsie [name].   He has a history of atypical chest pain with normal cath in 2008.  Other issues include atrial arrhythmias - seen by Dr. Caryl [name] in the past, dilated aortic root - followed by Dr. Roxan [name], HLD and DM.   He is a Production designer, theatre/television/film for [employer].   Last seen in December of 2020 by Dr. Johnsie [name]. Felt to be stable but continues to struggle with CV risk factor modification.   [name] in today. Here with   Past Medical History:  Diagnosis Date  . Allergy   . Aortic root enlargement (HCC)    CT 42 mm 2014  . Chronic back pain   . Diabetes mellitus    type II  . DJD (degenerative joint disease)    per MRI of LS spine 2005  . GERD (gastroesophageal reflux disease)   . Hyperlipidemia   . Hypertension   . Hypogonadism male    dx w/ hypogonadism elsewhere ~ 2011  . Normal cardiac stress test    (-) stress test 2003, 2006  . S/P cardiac cath     04-2006 neg per pt--CP improved w/ PPIs  . Sleep apnea    uses cpap  . Tachycardia    ER 07-2012, ?SCR, on atenolol    Past Surgical History:  Procedure Laterality Date  . CARDIAC CATHETERIZATION    . CYSTECTOMY     from back  . POLYPECTOMY       Medications: No outpatient medications have been marked as taking for the 11/02/19 encounter (Appointment) with Anthony Junes, NP.     Allergies: No Known Allergies  Social History: The patient  reports that he quit smoking about 38 years ago. His smoking use included cigars. He has never used smokeless tobacco. He reports current alcohol use. He reports that he does not use drugs.   Family History: The patient's ***family history includes Bladder Cancer  in his paternal uncle; Anthony polyps in his brother; Diabetes in an other family member; Heart disease in his brother and father; Prostate cancer (age of onset: 46) in his brother; Sinusitis in his brother; Stomach cancer in his paternal uncle.   Review of Systems: Please see the history of present illness.   All other systems are reviewed and negative.   Physical Exam: VS:  There were no vitals taken for this visit. Marland Kitchen  BMI There is no height or weight on file to calculate BMI.  Wt Readings from Last 3 Encounters:  10/02/19 (!) 272 lb (123.4 kg)  09/07/19 279 lb 6.4 oz (126.7 kg)  08/25/19 279 lb (126.6 kg)    General: Pleasant. Well developed, well nourished and in no acute distress.   HEENT: Normal.  Neck: Supple, no JVD, carotid bruits, or masses noted.  Cardiac: ***Regular rate and rhythm. No murmurs, rubs, or gallops. No edema.  Respiratory:  Lungs are clear to auscultation bilaterally with normal work of breathing.  GI: Soft and nontender.  MS: No deformity or atrophy. Gait and ROM intact.  Skin: Warm and dry. Color is normal.  Neuro:  Strength and sensation are intact and no gross  focal deficits noted.  Psych: Alert, appropriate and with normal affect.   LABORATORY DATA:  EKG:  EKG {ACTION; IS/IS LEX:[id number]} ordered today.  Personally reviewed by me. This demonstrates ***.  Lab Results  Component Value Date   WBC 7.4 05/26/2019   HGB 13.5 05/26/2019   HCT 39.8 05/26/2019   PLT 273.0 05/26/2019   GLUCOSE 110 (H) 10/02/2019   CHOL 164 05/26/2019   TRIG 96.0 05/26/2019   HDL 41.20 05/26/2019   LDLCALC 103 (H) 05/26/2019   ALT 16 05/26/2019   AST 14 05/26/2019   NA 139 10/02/2019   K 4.1 10/02/2019   CL 103 10/02/2019   CREATININE 0.88 10/02/2019   BUN 17 10/02/2019   CO2 30 10/02/2019   TSH 1.59 05/16/2018   PSA 0.79 05/16/2018   INR 1.0 RATIO 05/06/2006   HGBA1C 7.0 (H) 10/02/2019   MICROALBUR 0.3 10/18/2016     BNP (last 3 results) No results for  input(s): BNP in the last 8760 hours.  ProBNP (last 3 results) No results for input(s): PROBNP in the last 8760 hours.   Other Studies Reviewed Today:  Echo Study Conclusions 08/2015  - Left ventricle: The cavity size was normal. There was mild focal basal hypertrophy of the septum. Systolic function was vigorous. The estimated ejection fraction was in the range of 65% to 70%. Wall motion was normal; there were no regional wall motion abnormalities. Features are consistent with a pseudonormal left ventricular filling pattern, with concomitant abnormal relaxation and increased filling pressure (grade 2 diastolic dysfunction). - Aorta: Aortic root dimension: 44 mm (ED) sinus of Valsalva. - Aortic root: The aortic root was moderately dilated. - Left atrium: The atrium was mildly dilated. Volume/bsa, ES, (1-plane Simpson&'s, A2C): 35.8 ml/m^2. - Right ventricle: The cavity size was mildly dilated. Wall thickness was normal.  Impressions:  - Previous echo 2015 - aortic root 47mm. No significant change.   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 Aortic Root Dilatation: BP well controlled Root 4.6 cm stable by MRI 08/08/18   F/U Dr Anthony [name]  2. H/o Symptomatic Atrial Arrhthymias: well controlled with atenolol.  Quiescent   3. BSW:HQPR controlled on current regimen.   4. FFM:BWGYKZLD by PCP. Well controlled on Lipitor. Recent Lipid panel noted.  5. DM: followed by PCP - he is trying to do better with diet/weight loss.  6. Obesity:he seems motivated to make changes. Discussed at length.   Current medicines are reviewed with the patient today.  The patient does not have concerns regarding medicines other than what has been noted above.  The following changes have been made:  See above.  Labs/ tests ordered today include:   No orders of the defined types were placed in this  encounter.    Disposition:   FU with *** in {gen number [id number]} {Days to years:10300}.   Patient is agreeable to this plan and will call if any problems develop in the interim.   SignedTruitt Merle, NP  10/28/2019 11:23 AM  [facility] [address] [place], [place]  [address] Phone: [phone] Fax: [phone]

## 2019-10-29 ENCOUNTER — Other Ambulatory Visit: Payer: Self-pay

## 2019-10-29 ENCOUNTER — Ambulatory Visit: Payer: 59 | Admitting: Cardiovascular Disease

## 2019-10-29 ENCOUNTER — Encounter: Payer: Self-pay | Admitting: Cardiovascular Disease

## 2019-10-29 VITALS — BP 112/82 | HR 69 | Ht 75.0 in | Wt 270.0 lb

## 2019-10-29 DIAGNOSIS — Z01818 Encounter for other preprocedural examination: Secondary | ICD-10-CM

## 2019-10-29 DIAGNOSIS — I491 Atrial premature depolarization: Secondary | ICD-10-CM

## 2019-10-29 NOTE — Patient Instructions (Signed)

## 2019-10-29 NOTE — Progress Notes (Signed)
CARDIOLOGY OFFICE NOTE  Date:  10/29/2019    Anthony [name] Date of Birth: 01-01-1956 Medical Record #[id number]  PCP:  Colon Branch, MD  Cardiologist:  [name]    No chief complaint on file.   History of Present Illness:  64 y.o. previous atypical chest pain with normal cath in 2008. History of atrial arrhythmias seen by Dr Anthony [name] and  Rx with atenolol. Has aortic root dilatation measures 4.6 cm by  MRI May 2020  This is followed by Dr Anthony [name]  BP is well controlledOn ARB, beta blocker and diuretic. On statin for HLD and has DM-2 with some poor dietary choices  Aproved for  driving a commercial truck Has no current cardiac issues Hauls gas for [employer]  Camp discussion about need to exercise. 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 Allergy   . Aortic root enlargement (HCC)    CT 42 mm 2014  . Chronic back pain   . Diabetes mellitus    type II  . DJD (degenerative joint disease)    per MRI of LS spine 2005  . GERD (gastroesophageal reflux disease)   . Hyperlipidemia   . Hypertension   . Hypogonadism male    dx w/ hypogonadism elsewhere ~ 2011  . Normal cardiac stress test    (-) stress test 2003, 2006  . S/P cardiac cath     04-2006 neg per pt--CP improved w/ PPIs  . Sleep apnea    uses cpap  . Tachycardia    ER 07-2012, ?SCR, on atenolol    Past Surgical History:  Procedure Laterality Date  . CARDIAC CATHETERIZATION    . CYSTECTOMY     from back  . POLYPECTOMY       Medications: Current Meds  Medication Sig  . aspirin 81 MG tablet Take 81 mg by mouth daily.    Marland Kitchen atenolol (TENORMIN) 50 MG  tablet Take 0.5 tablets (25 mg total) by mouth daily.  . hydrochlorothiazide (HYDRODIURIL) 25 MG tablet Take 1 tablet (25 mg total) by mouth daily.  Marland Kitchen JANUVIA 100 MG tablet TAKE 1 TABLET BY MOUTH EVERY DAY  . losartan (COZAAR) 25 MG tablet Take 1 tablet (25 mg total) by mouth daily.  . metFORMIN (GLUCOPHAGE) 850 MG tablet TAKE 2 TABLETS BY MOUTH WITH BREAKFAST AND 1 TABLET BY MOUTH WITH DINNER  . ONETOUCH ULTRA test strip CHECK BLOOD SUGAR NO MORE THAN TWICE DAILY. DX IS E11.9. MAX ON INSURANCE  . pioglitazone (ACTOS) 30 MG tablet Take 1 tablet (30 mg total) by mouth daily.  . pravastatin (PRAVACHOL) 20 MG tablet Take 1 tablet (20 mg total) by mouth daily.     Allergies: No Known Allergies  Social History: The patient  reports that he quit smoking about 38 years ago. His smoking use included cigars. He has never used smokeless tobacco. He reports current alcohol use. He reports that he does not use drugs.   Family History: The patient's family history includes Bladder Cancer in his paternal uncle; Colon polyps in his brother; Diabetes in an other family member; Heart  disease in his brother and father; Prostate cancer (age of onset: 15) in his brother; Sinusitis in his brother; Stomach cancer in his paternal uncle.   Review of Systems: Please see the history of present illness.   Otherwise, the review of systems is positive for none.   All other systems are reviewed and negative.   Physical Exam: VS:  BP 112/82   Pulse 69   Ht 6\' 3"  (1.905 m)   Wt 270 lb (122.5 kg)   SpO2 98%   BMI 33.75 kg/m  .  BMI Body mass index is 33.75 kg/m.  Wt Readings from Last 3 Encounters:  10/29/19 270 lb (122.5 kg)  10/02/19 (!) 272 lb (123.4 kg)  09/07/19 279 lb 6.4 oz (126.7 kg)    Affect appropriate Healthy:  appears stated age 58: normal Neck supple with no adenopathy JVP normal no bruits no thyromegaly Lungs clear with no wheezing and good diaphragmatic motion Heart:  S1/S2 no murmur,  no rub, gallop or click PMI normal Abdomen: benighn, BS positve, no tenderness, no AAA no bruit.  No HSM or HJR Distal pulses intact with no bruits No edema Neuro non-focal Skin warm and dry No muscular weakness   LABORATORY DATA:  EKG:   .11/20/16 SR rate 73 normal  03/18/18 SR rate 70 normal SR rate 69 normal 10/1919  Lab Results  Component Value Date   WBC 7.4 05/26/2019   HGB 13.5 05/26/2019   HCT 39.8 05/26/2019   PLT 273.0 05/26/2019   GLUCOSE 110 (H) 10/02/2019   CHOL 164 05/26/2019   TRIG 96.0 05/26/2019   HDL 41.20 05/26/2019   LDLCALC 103 (H) 05/26/2019   ALT 16 05/26/2019   AST 14 05/26/2019   NA 139 10/02/2019   K 4.1 10/02/2019   CL 103 10/02/2019   CREATININE 0.88 10/02/2019   BUN 17 10/02/2019   CO2 30 10/02/2019   TSH 1.59 05/16/2018   PSA 0.79 05/16/2018   INR 1.0 RATIO 05/06/2006   HGBA1C 7.0 (H) 10/02/2019   MICROALBUR 0.3 10/18/2016     BNP (last 3 results) No results for input(s): BNP in the last 8760 hours.  ProBNP (last 3 results) No results for input(s): PROBNP in the last 8760 hours.   Other Studies Reviewed Today:  Echo Study Conclusions 08/2015  - Left ventricle: The cavity size was normal. There was mild focal   basal hypertrophy of the septum. Systolic function was vigorous.   The estimated ejection fraction was in the range of 65% to 70%.   Wall motion was normal; there were no regional wall motion   abnormalities. Features are consistent with a pseudonormal left   ventricular filling pattern, with concomitant abnormal relaxation   and increased filling pressure (grade 2 diastolic dysfunction). - Aorta: Aortic root dimension: 44 mm (ED) sinus of Valsalva. - Aortic root: The aortic root was moderately dilated. - Left atrium: The atrium was mildly dilated. Volume/bsa, ES,   (1-plane Simpson&'s, A2C): 35.8 ml/m^2. - Right ventricle: The cavity size was mildly dilated. Wall   thickness was normal.  Impressions:  - Previous  echo 2015 - aortic root 33mm. No significant change.   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 Aortic Root Dilatation: BP well controlled Root 4.6 cm stable by MRI 08/08/18   F/U Dr Anthony [name]  2. H/o Symptomatic Atrial Arrhthymias: well controlled with atenolol.  Quiescent   3. HTN: well controlled on  current regimen.   4. HLD: followed by PCP. Well controlled on Lipitor. Recent Lipid panel noted.  5. DM: followed by PCP - he is trying to do better with diet/weight loss.  6. Obesity: he seems motivated to make changes. Discussed at length.   7. Preoperative : clear to have right knee arthroscopy with Dr Anthony [name] at any time   Current medicines are reviewed with the patient today.  The patient does not have concerns regarding medicines other than what has been noted above.  The following changes have been made:  See above.  Labs/ tests ordered today include:    No orders of the defined types were placed in this encounter.    Disposition:   FU with me in a year   Patient is agreeable to this plan and will call if any problems develop in the interim.   Signed: Jenkins Rouge, MD  10/29/2019 4:09 PM  [facility] [address] [place], [place]  [address] Phone: [phone] Fax: [phone]

## 2019-10-29 NOTE — Telephone Encounter (Signed)
Follow Up  [facility] calling in to check on preop clearance. Informed of appt. Calling patient to see if he can get a sooner appt.

## 2019-10-31 ENCOUNTER — Other Ambulatory Visit: Payer: Self-pay | Admitting: Internal Medicine

## 2019-11-02 ENCOUNTER — Ambulatory Visit: Payer: 59 | Admitting: Nurse Practitioner

## 2019-11-04 ENCOUNTER — Other Ambulatory Visit: Payer: Self-pay | Admitting: Internal Medicine

## 2019-11-07 ENCOUNTER — Other Ambulatory Visit: Payer: Self-pay | Admitting: Internal Medicine

## 2019-12-02 ENCOUNTER — Other Ambulatory Visit: Payer: Self-pay | Admitting: Internal Medicine

## 2019-12-30 ENCOUNTER — Other Ambulatory Visit: Payer: Self-pay | Admitting: Internal Medicine

## 2020-01-11 ENCOUNTER — Encounter: Payer: Self-pay | Admitting: Thoracic Surgery (Cardiothoracic Vascular Surgery)

## 2020-01-15 ENCOUNTER — Encounter: Payer: Self-pay | Admitting: Internal Medicine

## 2020-01-15 LAB — HM DIABETES EYE EXAM

## 2020-01-16 ENCOUNTER — Other Ambulatory Visit: Payer: Self-pay | Admitting: Internal Medicine

## 2020-01-22 ENCOUNTER — Telehealth: Payer: Self-pay | Admitting: Internal Medicine

## 2020-01-22 DIAGNOSIS — E119 Type 2 diabetes mellitus without complications: Secondary | ICD-10-CM

## 2020-01-22 NOTE — Telephone Encounter (Signed)
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 He wants to come in on Monday for the test. Please advise

## 2020-01-22 NOTE — Telephone Encounter (Signed)
Please advise 

## 2020-01-25 ENCOUNTER — Other Ambulatory Visit: Payer: Self-pay

## 2020-01-25 ENCOUNTER — Other Ambulatory Visit (INDEPENDENT_AMBULATORY_CARE_PROVIDER_SITE_OTHER): Payer: 59

## 2020-01-25 DIAGNOSIS — E119 Type 2 diabetes mellitus without complications: Secondary | ICD-10-CM | POA: Diagnosis not present

## 2020-01-25 NOTE — Telephone Encounter (Signed)
A1c ordered. LMOM informing that order has been placed. Instructed to call office to schedule lab appt.

## 2020-01-25 NOTE — Telephone Encounter (Signed)
Is ok, thx

## 2020-01-26 LAB — HEMOGLOBIN A1C: Hgb A1c MFr Bld: 7.2 % — ABNORMAL HIGH (ref 4.6–6.5)

## 2020-01-28 ENCOUNTER — Encounter: Payer: Self-pay | Admitting: Internal Medicine

## 2020-03-24 ENCOUNTER — Other Ambulatory Visit: Payer: Self-pay | Admitting: Internal Medicine

## 2020-03-25 ENCOUNTER — Other Ambulatory Visit: Payer: Self-pay | Admitting: Internal Medicine

## 2020-03-26 ENCOUNTER — Other Ambulatory Visit: Payer: Self-pay | Admitting: Cardiovascular Disease

## 2020-04-08 ENCOUNTER — Other Ambulatory Visit: Payer: Self-pay

## 2020-04-08 ENCOUNTER — Encounter: Payer: Self-pay | Admitting: Internal Medicine

## 2020-04-08 ENCOUNTER — Ambulatory Visit: Payer: 59 | Admitting: Internal Medicine

## 2020-04-08 VITALS — BP 114/78 | HR 71 | Temp 97.6°F | Resp 16 | Ht 75.0 in | Wt 269.1 lb

## 2020-04-08 DIAGNOSIS — I10 Essential (primary) hypertension: Secondary | ICD-10-CM

## 2020-04-08 DIAGNOSIS — E785 Hyperlipidemia, unspecified: Secondary | ICD-10-CM

## 2020-04-08 DIAGNOSIS — E119 Type 2 diabetes mellitus without complications: Secondary | ICD-10-CM | POA: Diagnosis not present

## 2020-04-08 LAB — LIPID PANEL
Cholesterol: 122 mg/dL (ref 0–200)
HDL: 35.1 mg/dL — ABNORMAL LOW (ref >39.00)
LDL Cholesterol: 70 mg/dL (ref 0–99)
NonHDL: 86.65
Total CHOL/HDL Ratio: 3
Triglycerides: 82 mg/dL (ref 0.0–149.0)
VLDL: 16.4 mg/dL (ref 0.0–40.0)

## 2020-04-08 LAB — BASIC METABOLIC PANEL
BUN: 25 mg/dL — ABNORMAL HIGH (ref 6–23)
CO2: 31 mEq/L (ref 19–32)
Calcium: 9.4 mg/dL (ref 8.4–10.5)
Chloride: 102 mEq/L (ref 96–112)
Creatinine, Ser: 0.96 mg/dL (ref 0.40–1.50)
GFR: 84.68 mL/min (ref >60.00)
Glucose, Bld: 113 mg/dL — ABNORMAL HIGH (ref 70–99)
Potassium: 4 mEq/L (ref 3.5–5.1)
Sodium: 139 mEq/L (ref 135–145)

## 2020-04-08 LAB — HEMOGLOBIN A1C: Hgb A1c MFr Bld: 6.9 % — ABNORMAL HIGH (ref 4.6–6.5)

## 2020-04-08 NOTE — Patient Instructions (Addendum)
Diabetes: Check your blood sugar  once a day    at different times of the day  GOALS: Fasting before a meal 70- 130 2 hours after a meal less than 180    Online resources:  The American diabetes Association     diabetes.[facility] website it is a Microbiologist  [place].org  [facility] web site has a diabetes section  [employer].se   DIABETES FOR DUMMIES  GO TO THE LAB : Get the blood work     GO TO THE FRONT DESK, PLEASE SCHEDULE YOUR APPOINTMENTS Come back for   A PHYSICAL IN 4 MONTHS

## 2020-04-08 NOTE — Progress Notes (Signed)
Subjective:    Patient ID: Anthony [name], male    DOB: 07-Dec-1955, 65 y.o.   MRN: [id number]  DOS:  04/08/2020 Type of visit - description: f/u Since the last office visit is doing better with diet, has lost 8 pounds. We reviewed his ambulatory CBGs. He denies any lower extremity paresthesias.   Review of Systems See above   Past Medical History:  Diagnosis Date  . Allergy   . Aortic root enlargement (HCC)    CT 42 mm 2014  . Chronic back pain   . Diabetes mellitus    type II  . DJD (degenerative joint disease)    per MRI of LS spine 2005  . GERD (gastroesophageal reflux disease)   . Hyperlipidemia   . Hypertension   . Hypogonadism male    dx w/ hypogonadism elsewhere ~ 2011  . Normal cardiac stress test    (-) stress test 2003, 2006  . S/P cardiac cath     04-2006 neg per pt--CP improved w/ PPIs  . Sleep apnea    uses cpap  . Tachycardia    ER 07-2012, ?SCR, on atenolol    Past Surgical History:  Procedure Laterality Date  . CARDIAC CATHETERIZATION    . CYSTECTOMY     from back  . POLYPECTOMY      Allergies as of 04/08/2020   No Known Allergies     Medication List       Accurate as of April 08, 2020 11:59 PM. If you have any questions, ask your nurse or doctor.        aspirin 81 MG tablet Take 81 mg by mouth daily.   atenolol 50 MG tablet Commonly known as: TENORMIN TAKE 1/2 TABLET BY MOUTH DAILY   hydrochlorothiazide 25 MG tablet Commonly known as: HYDRODIURIL Take 1 tablet (25 mg total) by mouth daily.   losartan 25 MG tablet Commonly known as: COZAAR Take 1 tablet (25 mg total) by mouth daily.   metFORMIN 850 MG tablet Commonly known as: GLUCOPHAGE TAKE 2 TABLETS BY MOUTH WITH BREAKFAST AND 1 TABLET BY MOUTH WITH DINNER   OneTouch Ultra test strip Generic drug: glucose blood CHECK BLOOD SUGAR NO MORE THAN TWICE DAILY. DX IS E11.9. MAX ON INSURANCE   pioglitazone 30 MG tablet Commonly known as: ACTOS Take 1 tablet (30 mg total) by  mouth daily.   pravastatin 20 MG tablet Commonly known as: PRAVACHOL Take 1 tablet (20 mg total) by mouth daily.   sitaGLIPtin 100 MG tablet Commonly known as: Januvia Take 1 tablet (100 mg total) by mouth daily.          Objective:   Physical Exam BP 114/78 (BP Location: Left Arm, Patient Position: Sitting, Cuff Size: Normal)   Pulse 71   Temp 97.6 F (36.4 C) (Oral)   Resp 16   Ht 6\' 3"  (1.905 m)   Wt 269 lb 2 oz (122.1 kg)   SpO2 100%   BMI 33.64 kg/m  General:   Well developed, NAD, BMI noted. HEENT:  Normocephalic . Face symmetric, atraumatic Lungs:  CTA B Normal respiratory effort, no intercostal retractions, no accessory muscle use. Heart: RRR,  no murmur.  DM foot exam: No edema, good pedal pulses, pinprick examination negative Skin: Not pale. Not jaundice Neurologic:  alert & oriented X3.  Speech normal, gait appropriate for age and unassisted Psych--  Cognition and judgment appear intact.  Cooperative with normal attention span and concentration.  Behavior appropriate. No anxious  or depressed appearing.      Assessment      Assessment  DM HTN Hyperlipidemia CV Dr [name], next visit 08-2016 --Tachycardia : ER eval 2014,   rx atenolol --stress test 2003, 2006; Chest pain-2008,  (-) cardiac cath, sx decrease with PPIs ---Aorta enlargment per CT  2014, saw surgery 12-2016, they will cont observation OSA , severe - on Cpap GERD DJD, Chronic back pain, MRI 2005 showed OA Hypogonadism DX 2011, 2013:  FSH, LH and prolactin, declined Endo referral + FH Prostate ca brother age 29    PLAN DM: Doing great with diet for the last few weeks, has lost 8 pounds, has decreased carbohydrates.  Praised. CBGs less than 130 fasting.  Very rarely 150. We discussed his CBG and A1c goals.  Check a A1c. Continue Januvia, Actos, Metformin No symptoms or evidence of neuropathy HTN: No recent ambulatory BPs, continue HCTZ, losartan.  Check a CMP High cholesterol:  On Pravachol, check labs Cardiovascular: Saw cardiology for preop eval 10/29/2019.  Felt to be stable. Preventive care: Declined a flu shot. RTC 4 months CPX  This visit occurred during the SARS-CoV-2 public health emergency.  Safety protocols were in place, including screening questions prior to the visit, additional usage of staff PPE, and extensive cleaning of exam room while observing appropriate contact time as indicated for disinfecting solutions.

## 2020-04-08 NOTE — Progress Notes (Signed)
Pre visit review using our clinic review tool, if applicable. No additional management support is needed unless otherwise documented below in the visit note. 

## 2020-04-09 NOTE — Assessment & Plan Note (Signed)
DM: Doing great with diet for the last few weeks, has lost 8 pounds, has decreased carbohydrates.  Praised. CBGs less than 130 fasting.  Very rarely 150. We discussed his CBG and A1c goals.  Check a A1c. Continue Januvia, Actos, Metformin No symptoms or evidence of neuropathy HTN: No recent ambulatory BPs, continue HCTZ, losartan.  Check a CMP High cholesterol: On Pravachol, check labs Cardiovascular: Saw cardiology for preop eval 10/29/2019.  Felt to be stable. Preventive care: Declined a flu shot. RTC 4 months CPX

## 2020-04-11 ENCOUNTER — Other Ambulatory Visit: Payer: Self-pay | Admitting: Internal Medicine

## 2020-04-30 ENCOUNTER — Other Ambulatory Visit: Payer: Self-pay | Admitting: Internal Medicine

## 2020-05-28 ENCOUNTER — Other Ambulatory Visit: Payer: Self-pay | Admitting: Internal Medicine

## 2020-05-30 MED ORDER — PRAVASTATIN SODIUM 20 MG PO TABS: 20.0000 mg | ORAL_TABLET | Freq: Every day | ORAL | 1 refills | Status: DC

## 2020-05-30 MED ORDER — METFORMIN HCL 850 MG PO TABS: ORAL_TABLET | ORAL | 1 refills | Status: DC

## 2020-05-30 NOTE — Addendum Note (Signed)
Addended byDamita Dunnings D on: 05/30/2020 09:03 AM   Modules accepted: Orders

## 2020-06-10 ENCOUNTER — Encounter: Payer: Self-pay | Admitting: Gastroenterology

## 2020-06-25 ENCOUNTER — Other Ambulatory Visit: Payer: Self-pay | Admitting: Internal Medicine

## 2020-07-26 ENCOUNTER — Other Ambulatory Visit: Payer: Self-pay | Admitting: Thoracic Surgery (Cardiothoracic Vascular Surgery)

## 2020-07-26 DIAGNOSIS — I77810 Thoracic aortic ectasia: Secondary | ICD-10-CM

## 2020-08-12 ENCOUNTER — Ambulatory Visit: Payer: 59 | Admitting: Internal Medicine

## 2020-08-12 ENCOUNTER — Other Ambulatory Visit: Payer: Self-pay

## 2020-08-12 ENCOUNTER — Encounter: Payer: Self-pay | Admitting: Internal Medicine

## 2020-08-12 VITALS — BP 122/70 | HR 56 | Temp 97.7°F | Resp 16 | Ht 75.0 in | Wt 275.4 lb

## 2020-08-12 DIAGNOSIS — Z7185 Encounter for immunization safety counseling: Secondary | ICD-10-CM | POA: Diagnosis not present

## 2020-08-12 DIAGNOSIS — I10 Essential (primary) hypertension: Secondary | ICD-10-CM

## 2020-08-12 DIAGNOSIS — E119 Type 2 diabetes mellitus without complications: Secondary | ICD-10-CM

## 2020-08-12 DIAGNOSIS — Z23 Encounter for immunization: Secondary | ICD-10-CM

## 2020-08-12 LAB — CBC WITH DIFFERENTIAL/PLATELET
Basophils Absolute: 0.1 10*3/uL (ref 0.0–0.1)
Basophils Relative: 0.9 % (ref 0.0–3.0)
Eosinophils Absolute: 0.1 10*3/uL (ref 0.0–0.7)
Eosinophils Relative: 1.8 % (ref 0.0–5.0)
HCT: 39.6 % (ref 39.0–52.0)
Hemoglobin: 13.4 g/dL (ref 13.0–17.0)
Lymphocytes Relative: 17.7 % (ref 12.0–46.0)
Lymphs Abs: 1.2 10*3/uL (ref 0.7–4.0)
MCHC: 33.9 g/dL (ref 30.0–36.0)
MCV: 90.7 fl (ref 78.0–100.0)
Monocytes Absolute: 0.7 10*3/uL (ref 0.1–1.0)
Monocytes Relative: 10.8 % (ref 3.0–12.0)
Neutro Abs: 4.6 10*3/uL (ref 1.4–7.7)
Neutrophils Relative %: 68.8 % (ref 43.0–77.0)
Platelets: 267 10*3/uL (ref 150.0–400.0)
RBC: 4.37 Mil/uL (ref 4.22–5.81)
RDW: 12.8 % (ref 11.5–15.5)
WBC: 6.7 10*3/uL (ref 4.0–10.5)

## 2020-08-12 LAB — HEMOGLOBIN A1C: Hgb A1c MFr Bld: 7.5 % — ABNORMAL HIGH (ref 4.6–6.5)

## 2020-08-12 NOTE — Progress Notes (Signed)
Subjective:    Patient ID: Anthony [name], male    DOB: September 15, 1955, 65 y.o.   MRN: [id number]  DOS:  08/12/2020 Type of visit - description: ROV  Today with talk about DM, HTN high cholesterol.  The last time he was here he was doing great with his diet, then he started to struggle , not doing so well at this time.  + Weight gain.  Wt Readings from Last 3 Encounters:  08/12/20 275 lb 6 oz (124.9 kg)  04/08/20 269 lb 2 oz (122.1 kg)  10/29/19 270 lb (122.5 kg)    Review of Systems See above   Past Medical History:  Diagnosis Date  . Allergy   . Aortic root enlargement (HCC)    CT 42 mm 2014  . Chronic back pain   . Diabetes mellitus    type II  . DJD (degenerative joint disease)    per MRI of LS spine 2005  . GERD (gastroesophageal reflux disease)   . Hyperlipidemia   . Hypertension   . Hypogonadism male    dx w/ hypogonadism elsewhere ~ 2011  . Normal cardiac stress test    (-) stress test 2003, 2006  . S/P cardiac cath     04-2006 neg per pt--CP improved w/ PPIs  . Sleep apnea    uses cpap  . Tachycardia    ER 07-2012, ?SCR, on atenolol    Past Surgical History:  Procedure Laterality Date  . CARDIAC CATHETERIZATION    . CYSTECTOMY     from back  . POLYPECTOMY      Allergies as of 08/12/2020   No Known Allergies     Medication List       Accurate as of August 12, 2020 11:59 PM. If you have any questions, ask your nurse or doctor.        aspirin 81 MG tablet Take 81 mg by mouth daily.   atenolol 50 MG tablet Commonly known as: TENORMIN TAKE 1/2 TABLET BY MOUTH DAILY   hydrochlorothiazide 25 MG tablet Commonly known as: HYDRODIURIL Take 1 tablet (25 mg total) by mouth daily.   losartan 25 MG tablet Commonly known as: COZAAR TAKE 1 TABLET BY MOUTH EVERY DAY   metFORMIN 850 MG tablet Commonly known as: GLUCOPHAGE Take 2 tablets (1,700 mg total) by mouth daily with breakfast AND 1 tablet (850 mg total) daily with supper.   OneTouch Ultra test  strip Generic drug: glucose blood CHECK BLOOD SUGAR NO MORE THAN TWICE DAILY. DX IS E11.9. MAX ON INSURANCE   pioglitazone 30 MG tablet Commonly known as: ACTOS Take 1 tablet (30 mg total) by mouth daily.   pravastatin 20 MG tablet Commonly known as: PRAVACHOL Take 1 tablet (20 mg total) by mouth daily.   sitaGLIPtin 100 MG tablet Commonly known as: Januvia Take 1 tablet (100 mg total) by mouth daily.          Objective:   Physical Exam BP 122/70 (BP Location: Left Arm, Patient Position: Sitting, Cuff Size: Normal)   Pulse (!) 56   Temp 97.7 F (36.5 C) (Oral)   Resp 16   Ht 6\' 3"  (1.905 m)   Wt 275 lb 6 oz (124.9 kg)   SpO2 97%   BMI 34.42 kg/m  General:   Well developed, NAD, BMI noted. HEENT:  Normocephalic . Face symmetric, atraumatic Lungs:  CTA B Normal respiratory effort, no intercostal retractions, no accessory muscle use. Heart: RRR,  no murmur.  Lower  extremities: no pretibial edema bilaterally  Skin: Not pale. Not jaundice Neurologic:  alert & oriented X3.  Speech normal, gait appropriate for age and unassisted Psych--  Cognition and judgment appear intact.  Cooperative with normal attention span and concentration.  Behavior appropriate. No anxious or depressed appearing.      Assessment      Assessment  DM HTN Hyperlipidemia CV Dr [name], next visit 08-2016 --Tachycardia : ER eval 2014,   rx atenolol --stress test 2003, 2006; Chest pain-2008,  (-) cardiac cath, sx decrease with PPIs ---Aorta enlargment per CT  2014, saw surgery 12-2016, they will cont observation OSA , severe - on Cpap GERD DJD, Chronic back pain, MRI 2005 showed OA Hypogonadism DX 2011, 2013:  FSH, LH and prolactin, declined Endo referral + FH Prostate ca brother age 52    PLAN DM: Since the last visit, let his guard down, not eating healthy, + weight gain. Ambulatory CBGs in the morning 140s, in the afternoon 120s. Extensive discussion about diet, needs some  structure way to do it, weight watchers?. Continue metformin, Actos, Januvia.  If not well controlled we could switch Januvia to Rybelsus. Check A1c HTN: On Tenormin, HCTZ, losartan.  Check a BMP and CBC. Vaccine advise: Shingrix No. 1 today, next in 2-3 m [place] recommended. RTC 4 months    This visit occurred during the SARS-CoV-2 public health emergency.  Safety protocols were in place, including screening questions prior to the visit, additional usage of staff PPE, and extensive cleaning of exam room while observing appropriate contact time as indicated for disinfecting solutions.

## 2020-08-12 NOTE — Patient Instructions (Signed)
Please consider structured diet such as weight watchers.  Consider getting a COVID-vaccine #4    GO TO THE LAB : Get the blood work     [place], [place] back for a checkup in 4 months

## 2020-08-13 NOTE — Assessment & Plan Note (Addendum)
DM: Since the last visit, let his guard down, not eating healthy, + weight gain. Ambulatory CBGs in the morning 140s, in the afternoon 120s. Extensive discussion about diet, needs some structure way to do it, weight watchers?. Continue metformin, Actos, Januvia.  If not well controlled we could switch Januvia to Rybelsus. Check A1c HTN: On Tenormin, HCTZ, losartan.  Check a BMP and CBC. Vaccine advise: Shingrix No. 1 today, next in 2-3 m Glenvil recommended. RTC 4 months

## 2020-08-15 LAB — BASIC METABOLIC PANEL
BUN: 16 mg/dL (ref 6–23)
CO2: 27 mEq/L (ref 19–32)
Calcium: 9.3 mg/dL (ref 8.4–10.5)
Chloride: 101 mEq/L (ref 96–112)
Creatinine, Ser: 0.84 mg/dL (ref 0.40–1.50)
GFR: 93.19 mL/min (ref >60.00)
Glucose, Bld: 133 mg/dL — ABNORMAL HIGH (ref 70–99)
Potassium: 4.2 mEq/L (ref 3.5–5.1)
Sodium: 138 mEq/L (ref 135–145)

## 2020-08-19 ENCOUNTER — Ambulatory Visit
Admission: RE | Admit: 2020-08-19 | Discharge: 2020-08-19 | Disposition: A | Discharge status: Home / Self Care | Payer: 59 | Source: Ambulatory Visit | Attending: Thoracic Surgery (Cardiothoracic Vascular Surgery) | Admitting: Thoracic Surgery (Cardiothoracic Vascular Surgery)

## 2020-08-19 DIAGNOSIS — I77810 Thoracic aortic ectasia: Secondary | ICD-10-CM

## 2020-08-19 MED ORDER — GADOBENATE DIMEGLUMINE 529 MG/ML IV SOLN
20.0000 mL | Freq: Once | INTRAVENOUS | Status: AC | PRN
  Administered 2020-08-19: 20 mL via INTRAVENOUS

## 2020-08-26 ENCOUNTER — Telehealth: Payer: Self-pay | Admitting: Internal Medicine

## 2020-08-26 NOTE — Telephone Encounter (Signed)
The patient states his CPAP machine is broken. would like to know what he needs to know to get a new one.

## 2020-08-26 NOTE — Telephone Encounter (Signed)
Spoke w/ Pt- informed he needed to contact pulmonary- office number given.

## 2020-08-30 ENCOUNTER — Other Ambulatory Visit: Payer: Self-pay

## 2020-08-30 ENCOUNTER — Encounter: Payer: Self-pay | Admitting: Thoracic Surgery (Cardiothoracic Vascular Surgery)

## 2020-08-30 ENCOUNTER — Ambulatory Visit: Payer: 59 | Admitting: Thoracic Surgery (Cardiothoracic Vascular Surgery)

## 2020-08-30 VITALS — BP 140/75 | HR 77 | Resp 20 | Ht 75.0 in | Wt 288.0 lb

## 2020-08-30 DIAGNOSIS — I712 Thoracic aortic aneurysm, without rupture, unspecified: Secondary | ICD-10-CM

## 2020-08-30 NOTE — Progress Notes (Signed)
[address]       [place] [address]             [phone]       HPI: Mr. Seaman returns for follow-up regarding his dilated aortic root.  Hamid Brookens is a 65 year old man with a history of hypertension, hyperlipidemia, type 2 diabetes, reflux, sleep apnea, and a dilated aortic root.  He was first found to have a dilated root on echocardiogram in 2014.  CT angiogram measured at 4.2 cm.  Has been followed mostly with MR since then.  The largest official measurement has been 4.8 cm, but my measurements it has never been more than 4.6 cm.  He continues to work as a Administrator.  He is not having any chest pain, pressure, tightness, or shortness of breath.  He has gained some weight.  He checks his blood pressure on a regular basis and it typically is less than 678 systolic.  Past Medical History:  Diagnosis Date   Allergy    Aortic root enlargement ([place])    CT 42 mm 2014   Chronic back pain    Diabetes mellitus    type II   DJD (degenerative joint disease)    per MRI of LS spine 2005   GERD (gastroesophageal reflux disease)    Hyperlipidemia    Hypertension    Hypogonadism male    dx w/ hypogonadism elsewhere ~ 2011   Normal cardiac stress test    (-) stress test 2003, 2006   S/P cardiac cath     04-2006 neg per pt--CP improved w/ PPIs   Sleep apnea    uses cpap   Tachycardia    ER 07-2012, ?SCR, on atenolol    Current Outpatient Medications  Medication Sig Dispense Refill   aspirin 81 MG tablet Take 81 mg by mouth daily.     atenolol (TENORMIN) 50 MG tablet TAKE 1/2 TABLET BY MOUTH DAILY 45 tablet 1   hydrochlorothiazide (HYDRODIURIL) 25 MG tablet Take 1 tablet (25 mg total) by mouth daily. 90 tablet 1   losartan (COZAAR) 25 MG tablet TAKE 1 TABLET BY MOUTH EVERY DAY 30 tablet 5   metFORMIN (GLUCOPHAGE) 850 MG tablet Take 2 tablets (1,700 mg total) by mouth daily with breakfast AND 1 tablet (850 mg total) daily with supper. 270 tablet 1   ONETOUCH  ULTRA test strip CHECK BLOOD SUGAR NO MORE THAN TWICE DAILY. DX IS E11.9. MAX ON INSURANCE 100 strip 12   pioglitazone (ACTOS) 30 MG tablet Take 1 tablet (30 mg total) by mouth daily. 90 tablet 1   pravastatin (PRAVACHOL) 20 MG tablet Take 1 tablet (20 mg total) by mouth daily. 90 tablet 1   sitaGLIPtin (JANUVIA) 100 MG tablet Take 1 tablet (100 mg total) by mouth daily. 90 tablet 1   No current facility-administered medications for this visit.    Physical Exam BP 140/75   Pulse 77   Resp 20   Ht 6\' 3"  (1.905 m)   Wt 288 lb (130.6 kg)   SpO2 97% Comment: RA  BMI 36.28 kg/m  65 year old man in no acute distress Alert and oriented x3 with no focal deficits Lungs clear bilaterally Cardiac regular rate and rhythm with normal S1 and S2 no rubs murmurs or gallops Trace edema  Diagnostic Tests: I personally reviewed the MR images.  There are relatively poor quality compared to his previous ones.  On the sagittal views the aortic root appears  unchanged.  The sinotubular junction and remainder of the aorta appear within normal limits.  Impression: Lonzy Mato is a 65 year old man with a history of hypertension, hyperlipidemia, type 2 diabetes, reflux, sleep apnea, and a dilated aortic root.  He has had a dilated aortic root dating back to about 2014.  He has been followed since then and there has been no significant change in size.  His scan today, although of lesser quality, shows no significant change from last year.  We will plan to follow-up in 1 year with a gated CT  Hypertension-blood pressure borderline elevated.  He checks it at home regularly.  Continue current regimen.  Plan: Return in 1 year for follow-up.  We will plan to do a cardiac gated CT scan to get a more precise measurement.  Melrose Nakayama, MD [facility] [phone]

## 2020-09-06 ENCOUNTER — Telehealth: Payer: Self-pay | Admitting: Pulmonary Disease

## 2020-09-06 DIAGNOSIS — G4733 Obstructive sleep apnea (adult) (pediatric): Secondary | ICD-10-CM

## 2020-09-06 NOTE — Telephone Encounter (Signed)
I have called and spoken with patient regarding CPAP and HST. I have informed patient that I cannot send script over mychart message since the provider has to sign it and Dr. Ander Slade will be back in the office on Friday, 09/09/20. The patient stated he will drop by the office then and pick up the script and HST then. I will be working with Dr. Ander Slade on Friday and have him sign. Patient verbalized understanding, nothing further needed.

## 2020-09-09 ENCOUNTER — Other Ambulatory Visit: Payer: Self-pay | Admitting: Pulmonary Disease

## 2020-09-09 ENCOUNTER — Ambulatory Visit (AMBULATORY_SURGERY_CENTER): Payer: 59

## 2020-09-09 ENCOUNTER — Other Ambulatory Visit: Payer: Self-pay

## 2020-09-09 VITALS — Ht 75.0 in | Wt 278.0 lb

## 2020-09-09 DIAGNOSIS — G4733 Obstructive sleep apnea (adult) (pediatric): Secondary | ICD-10-CM

## 2020-09-09 DIAGNOSIS — Z86010 Personal history of colonic polyps: Secondary | ICD-10-CM

## 2020-09-09 MED ORDER — NA SULFATE-K SULFATE-MG SULF 17.5-3.13-1.6 GM/177ML PO SOLN: 1.0000 | Freq: Once | ORAL | 0 refills | Status: AC

## 2020-09-09 NOTE — Progress Notes (Signed)
No egg or soy allergy known to patient  No issues with past sedation with any surgeries or procedures Patient states never had  intubation  No FH of Malignant Hyperthermia No diet pills per patient No home 02 use per patient  No blood thinners per patient  Pt denies issues with constipation  No A fib or A flutter  EMMI video to pt or via [place] 19 guidelines implemented in PV today with Pt and RN  Pt is fully vaccinated  for Covid     NO PA's for preps discussed with pt In PV today  Discussed with pt there will be an out-of-pocket cost for prep and that varies from $0 to 70 dollars   Due to the COVID-19 pandemic we are asking patients to follow certain guidelines.  Pt aware of COVID protocols and [facility] guidelines

## 2020-09-15 ENCOUNTER — Other Ambulatory Visit: Payer: Self-pay | Admitting: Cardiovascular Disease

## 2020-09-22 ENCOUNTER — Encounter: Payer: 59 | Admitting: Gastroenterology

## 2020-09-23 ENCOUNTER — Encounter: Payer: 59 | Admitting: Gastroenterology

## 2020-10-10 ENCOUNTER — Encounter: Payer: Self-pay | Admitting: Pulmonary Disease

## 2020-10-10 ENCOUNTER — Ambulatory Visit (INDEPENDENT_AMBULATORY_CARE_PROVIDER_SITE_OTHER): Payer: 59 | Admitting: Pulmonary Disease

## 2020-10-10 ENCOUNTER — Other Ambulatory Visit: Payer: Self-pay

## 2020-10-10 VITALS — BP 128/74 | HR 68 | Temp 98.1°F | Ht 75.0 in | Wt 280.8 lb

## 2020-10-10 DIAGNOSIS — G4733 Obstructive sleep apnea (adult) (pediatric): Secondary | ICD-10-CM

## 2020-10-10 NOTE — Progress Notes (Signed)
Anthony [name]    [id number]    1955-10-31  Primary Care Physician:Paz, Alda Berthold, MD  Referring Physician: Colon Branch, MD [address] [place],   [id number]  Chief complaint:   Patient with a history of obstructive sleep apnea  HPI:  History of severe obstructive sleep apnea Compliant with CPAP use Study was in 2013 showing severe obstructive sleep apnea  Has been on CPAP since then and continues to benefit from it on a regular basis He just had a device upgrade in the last 4 weeks  Goes to bed between 930 and 10 Wakes up about 4 AM Weight has been relatively stable  He has hypertension, diabetes-well-controlled  Wakes up in the morning feeling well rested on most days Real mouth dryness in the morning  He follows his CPAP use with an app on his phone on a regular basis  Outpatient Encounter Medications as of 10/10/2020  Medication Sig   aspirin 81 MG tablet Take 81 mg by mouth daily.   atenolol (TENORMIN) 50 MG tablet TAKE 1/2 TABLET BY MOUTH EVERY DAY   hydrochlorothiazide (HYDRODIURIL) 25 MG tablet Take 1 tablet (25 mg total) by mouth daily.   losartan (COZAAR) 25 MG tablet TAKE 1 TABLET BY MOUTH EVERY DAY   metFORMIN (GLUCOPHAGE) 850 MG tablet Take 2 tablets (1,700 mg total) by mouth daily with breakfast AND 1 tablet (850 mg total) daily with supper.   ONETOUCH ULTRA test strip CHECK BLOOD SUGAR NO MORE THAN TWICE DAILY. DX IS E11.9. MAX ON INSURANCE   pioglitazone (ACTOS) 30 MG tablet Take 1 tablet (30 mg total) by mouth daily.   pravastatin (PRAVACHOL) 20 MG tablet Take 1 tablet (20 mg total) by mouth daily.   sitaGLIPtin (JANUVIA) 100 MG tablet Take 1 tablet (100 mg total) by mouth daily.   No facility-administered encounter medications on file as of 10/10/2020.    Allergies as of 10/10/2020   (No Known Allergies)    Past Medical History:  Diagnosis Date   Aortic root enlargement ([facility])    CT 42 mm 2014   Chronic back pain    in  2022 pt states "it ended M[place]ie ago"   Diabetes mellitus    type II   DJD (degenerative joint disease)    per MRI of LS spine 2005   GERD (gastroesophageal reflux disease)    Hyperlipidemia    Hypertension    Hypogonadism male    dx w/ hypogonadism elsewhere ~ 2011   Normal cardiac stress test    (-) stress test 2003, 2006   S/P cardiac cath     04-2006 neg per pt--CP improved w/ PPIs   Sleep apnea    uses cpap   Tachycardia    ER 07-2012, ?SCR, on atenolol    Past Surgical History:  Procedure Laterality Date   CARDIAC CATHETERIZATION     COLONOSCOPY     CYSTECTOMY     from back   KNEE ARTHROSCOPY WITH MENISCAL REPAIR Right 2021   POLYPECTOMY      Family History  Problem Relation Age of Onset   Heart disease Father        CABG at age 55   Colon polyps Brother    Heart disease Brother        CABG   Sinusitis Brother    Diabetes Other        cousins   Prostate cancer Brother  47   Stomach cancer Paternal Uncle    Bladder Cancer Paternal Uncle    Colon cancer Neg Hx    Rectal cancer Neg Hx    Esophageal cancer Neg Hx     Social History   Socioeconomic History   Marital status: Married    Spouse name: Not on file   Number of children: 2   Years of education: Not on file   Highest education level: Not on file  Occupational History   Occupation: TRUCK DRIVER  Tobacco Use   Smoking status: Former    Types: Cigars    Quit date: 03/12/1981    Years since quitting: 39.6   Smokeless tobacco: Never  Vaping Use   Vaping Use: Never used  Substance and Sexual Activity   Alcohol use: Yes    Alcohol/week: 1.0 standard drink    Types: 1 Cans of beer per week    Comment: occasionally less than weekly   Drug use: No   Sexual activity: Not on file  Other Topics Concern   Not on file  Social History Narrative   Truck driver (local); lives w/ wife   Social Determinants of Health   Financial Resource Strain: Not on file  Food Insecurity: Not on file  Transportation  Needs: Not on file  Physical Activity: Not on file  Stress: Not on file  Social Connections: Not on file  Intimate Partner Violence: Not on file    Review of Systems  Constitutional:  Negative for activity change.  Respiratory:  Positive for apnea.    Vitals:   10/10/20 1537  BP: 128/74  Pulse: 68  Temp: 98.1 F (36.7 C)  SpO2: 99%     Physical Exam Constitutional:      Appearance: He is obese.  HENT:     Head: Normocephalic.     Nose: Nose normal.     Mouth/Throat:     Mouth: Mucous membranes are moist.     Comments: Mallampati 3, Eyes:     Pupils: Pupils are equal, round, and reactive to light.  Cardiovascular:     Rate and Rhythm: Normal rate and regular rhythm.     Heart sounds: No murmur heard.   No friction rub.  Pulmonary:     Effort: No respiratory distress.     Breath sounds: No stridor. No wheezing or rhonchi.  Musculoskeletal:     Cervical back: No rigidity or tenderness.  Neurological:     Mental Status: He is alert.  Psychiatric:        Mood and Affect: Mood normal.   Data Reviewed:   Assessment:  Severe obstructive sleep apnea -Adequately treated with CPAP therapy  Compliant with CPAP use on a nightly basis  Denies any significant daytime sleepiness at present  Plan/Recommendations: Continue using CPAP on a regular basis  We will try and get in touch with a DME company to get a download  Continues to benefit from CPAP use  Will follow on a yearly basis   Sherrilyn Rist MD [facility] 10/10/2020, 3:54 PM  CC: Colon Branch, MD

## 2020-10-10 NOTE — Patient Instructions (Signed)
Continue using CPAP on a nightly basis  Study from 2013 did reveal severe obstructive sleep apnea  Call with significant concerns  Sleep Apnea Sleep apnea is a condition in which breathing pauses or becomes shallow during sleep. People with sleep apnea usually snore loudly. They may have times when they gasp and stop breathing for 10 seconds or more during sleep. This mayhappen many times during the night. Sleep apnea disrupts your sleep and keeps your body from getting the rest that it needs. This condition can increase your risk of certain health problems, including: Heart attack. Stroke. Obesity. Type 2 diabetes. Heart failure. Irregular heartbeat. High blood pressure. The goal of treatment is to help you breathe normally again. What are the causes? The most common cause of sleep apnea is a collapsed or blocked airway. There are three kinds of sleep apnea: Obstructive sleep apnea. This kind is caused by a blocked or collapsed airway. Central sleep apnea. This kind happens when the part of the brain that controls breathing does not send the correct signals to the muscles that control breathing. Mixed sleep apnea. This is a combination of obstructive and central sleep apnea. What increases the risk? You are more likely to develop this condition if you: Are overweight. Smoke. Have a smaller than normal airway. Are older. Are male. Drink alcohol. Take sedatives or tranquilizers. Have a family history of sleep apnea. Have a tongue or tonsils that are larger than normal. What are the signs or symptoms? Symptoms of this condition include: Trouble staying asleep. Loud snoring. Morning headaches. Waking up gasping. Dry mouth or sore throat in the morning. Daytime sleepiness and tiredness. If you have daytime fatigue because of sleep apnea, you may be more likely to have: Trouble concentrating. Forgetfulness. Irritability or mood swings. Personality changes. Feelings of  depression. Sexual dysfunction. This may include loss of interest if you are male, or erectile dysfunction if you are male. How is this diagnosed? This condition may be diagnosed with: A medical history. A physical exam. A series of tests that are done while you are sleeping (sleep study). These tests are usually done in a sleep lab, but they may also be done at home. How is this treated? Treatment for this condition aims to restore normal breathing and to ease symptoms during sleep. It may involve managing health issues that can affect breathing, such as high blood pressure or obesity. Treatment may include: Sleeping on your side. Using a decongestant if you have nasal congestion. Avoiding the use of depressants, including alcohol, sedatives, and narcotics. Losing weight if you are overweight. Making changes to your diet. Quitting smoking. Using a device to open your airway while you sleep, such as: An oral appliance. This is a custom-made mouthpiece that shifts your lower jaw forward. A continuous positive airway pressure (CPAP) device. This device blows air through a mask when you breathe out (exhale). A nasal expiratory positive airway pressure (EPAP) device. This device has valves that you put into each nostril. A bi-level positive airway pressure (BPAP) device. This device blows air through a mask when you breathe in (inhale) and breathe out (exhale). Having surgery if other treatments do not work. During surgery, excess tissue is removed to create a wider airway. Follow these instructions at home: Lifestyle Make any lifestyle changes that your health care provider recommends. Eat a healthy, well-balanced diet. Take steps to lose weight if you are overweight. Avoid using depressants, including alcohol, sedatives, and narcotics. Do not use any products that contain  nicotine or tobacco. These products include cigarettes, chewing tobacco, and vaping devices, such as e-cigarettes. If  you need help quitting, ask your health care provider. General instructions Take over-the-counter and prescription medicines only as told by your health care provider. If you were given a device to open your airway while you sleep, use it only as told by your health care provider. If you are having surgery, make sure to tell your health care provider you have sleep apnea. You may need to bring your device with you. Keep all follow-up visits. This is important. Contact a health care provider if: The device that you received to open your airway during sleep is uncomfortable or does not seem to be working. Your symptoms do not improve. Your symptoms get worse. Get help right away if: You develop: Chest pain. Shortness of breath. Discomfort in your back, arms, or stomach. You have: Trouble speaking. Weakness on one side of your body. Drooping in your face. These symptoms may represent a serious problem that is an emergency. Do not wait to see if the symptoms will go away. Get medical help right away. Call your local emergency services (911 in the [place] Do not drive yourself to the hospital. Summary Sleep apnea is a condition in which breathing pauses or becomes shallow during sleep. The most common cause is a collapsed or blocked airway. The goal of treatment is to restore normal breathing and to ease symptoms during sleep. This information is not intended to replace advice given to you by your health care provider. Make sure you discuss any questions you have with your healthcare provider. Document Revised: 02/05/2020 Document Reviewed: 02/05/2020 Elsevier Patient Education  2022 [employer].

## 2020-10-12 ENCOUNTER — Other Ambulatory Visit: Payer: Self-pay | Admitting: Internal Medicine

## 2020-10-14 ENCOUNTER — Encounter: Payer: Self-pay | Admitting: Gastroenterology

## 2020-10-14 ENCOUNTER — Ambulatory Visit (AMBULATORY_SURGERY_CENTER): Payer: 59 | Admitting: Gastroenterology

## 2020-10-14 ENCOUNTER — Other Ambulatory Visit: Payer: Self-pay

## 2020-10-14 VITALS — BP 114/78 | HR 56 | Temp 97.1°F | Resp 10 | Ht 75.0 in | Wt 278.0 lb

## 2020-10-14 DIAGNOSIS — Z86010 Personal history of colonic polyps: Secondary | ICD-10-CM | POA: Diagnosis not present

## 2020-10-14 MED ORDER — SODIUM CHLORIDE 0.9 % IV SOLN: 500.0000 mL | Freq: Once | INTRAVENOUS | Status: DC

## 2020-10-14 NOTE — Progress Notes (Signed)
Pt's states no medical or surgical changes since previsit or office visit.   VS taken by CW 

## 2020-10-14 NOTE — Patient Instructions (Signed)
Impression/Recommendations:  Hemorrhoid handout given to patient.Resume previous diet. Continue present medications.  Repeat colonoscopy in 5 years for surveillance.  YOU HAD AN ENDOSCOPIC PROCEDURE TODAY AT [facility]:   Refer to the procedure report that was given to you for any specific questions about what was found during the examination.  If the procedure report does not answer your questions, please call your gastroenterologist to clarify.  If you requested that your care partner not be given the details of your procedure findings, then the procedure report has been included in a sealed envelope for you to review at your convenience later.  YOU SHOULD EXPECT: Some feelings of bloating in the abdomen. Passage of more gas than usual.  Walking can help get rid of the air that was put into your GI tract during the procedure and reduce the bloating. If you had a lower endoscopy (such as a colonoscopy or flexible sigmoidoscopy) you may notice spotting of blood in your stool or on the toilet paper. If you underwent a bowel prep for your procedure, you may not have a normal bowel movement for a few days.  Please Note:  You might notice some irritation and congestion in your nose or some drainage.  This is from the oxygen used during your procedure.  There is no need for concern and it should clear up in a day or so.  SYMPTOMS TO REPORT IMMEDIATELY:  Following lower endoscopy (colonoscopy or flexible sigmoidoscopy):  Excessive amounts of blood in the stool  Significant tenderness or worsening of abdominal pains  Swelling of the abdomen that is new, acute  Fever of 100F or higher  For urgent or emergent issues, a gastroenterologist can be reached at any hour by calling [phone]. Do not use MyChart messaging for urgent concerns.    DIET:  We do recommend a small meal at first, but then you may proceed to your regular diet.  Drink plenty of fluids but you should avoid  alcoholic beverages for 24 hours.  ACTIVITY:  You should plan to take it easy for the rest of today and you should NOT DRIVE or use heavy machinery until tomorrow (because of the sedation medicines used during the test).    FOLLOW UP: Our staff will call the number listed on your records 48-72 hours following your procedure to check on you and address any questions or concerns that you may have regarding the information given to you following your procedure. If we do not reach you, we will leave a message.  We will attempt to reach you two times.  During this call, we will ask if you have developed any symptoms of COVID 19. If you develop any symptoms (ie: fever, flu-like symptoms, shortness of breath, cough etc.) before then, please call [phone].  If you test positive for Covid 19 in the 2 weeks post procedure, please call and report this information to [place].    If any biopsies were taken you will be contacted by phone or by letter within the next 1-3 weeks.  Please call us at [phone] if you have not heard about the biopsies in 3 weeks.    SIGNATURES/CONFIDENTIALITY: You and/or your care partner have signed paperwork which will be entered into your electronic medical record.  These signatures attest to the fact that that the information above on your After Visit Summary has been reviewed and is understood.  Full responsibility of the confidentiality of this discharge information lies with you and/or your  care-partner.  

## 2020-10-14 NOTE — Op Note (Signed)
[place] Patient Name: Anthony [name] Procedure Date: 10/14/2020 8:26 AM MRN: [id number] Endoscopist: Ladene Artist , MD Age: 65 Referring MD:  Date of Birth: 1955/11/22 Gender: Male Account #: [id number] Procedure:                Colonoscopy Indications:              Surveillance: Personal history of adenomatous                            polyps on last colonoscopy > 3 years ago Medicines:                Monitored Anesthesia Care Procedure:                Pre-Anesthesia Assessment:                           - Prior to the procedure, a History and Physical                            was performed, and patient medications and                            allergies were reviewed. The patient's tolerance of                            previous anesthesia was also reviewed. The risks                            and benefits of the procedure and the sedation                            options and risks were discussed with the patient.                            All questions were answered, and informed consent                            was obtained. Prior Anticoagulants: The patient has                            taken no previous anticoagulant or antiplatelet                            agents. ASA Grade Assessment: II - A patient with                            mild systemic disease. After reviewing the risks                            and benefits, the patient was deemed in                            satisfactory condition to undergo the procedure.  After obtaining informed consent, the colonoscope                            was passed under direct vision. Throughout the                            procedure, the patient's blood pressure, pulse, and                            oxygen saturations were monitored continuously. The                            Olympus CF-HQ190L Colonoscope was introduced                            through the anus and advanced to  the the cecum,                            identified by appendiceal orifice and ileocecal                            valve. The ileocecal valve, appendiceal orifice,                            and rectum were photographed. The quality of the                            bowel preparation was good. The colonoscopy was                            performed without difficulty. The patient tolerated                            the procedure well. Scope In: 8:35:24 AM Scope Out: 8:52:17 AM Scope Withdrawal Time: 0 hours 13 minutes 38 seconds  Total Procedure Duration: 0 hours 16 minutes 53 seconds  Findings:                 The perianal and digital rectal examinations were                            normal.                           There was a medium-sized lipoma, 15 mm in diameter,                            in the ascending colon.                           A tattoo was seen in the transverse colon. The                            tattoo site appeared normal.  Internal hemorrhoids were found during                            retroflexion. The hemorrhoids were small and Grade                            I (internal hemorrhoids that do not prolapse).                           The exam was otherwise without abnormality on                            direct and retroflexion views. Complications:            No immediate complications. Estimated blood loss:                            None. Estimated Blood Loss:     Estimated blood loss: none. Impression:               - Ascending colon lipoma.                           - Transverse colon tattoo.                           - Internal hemorrhoids.                           - The examination was otherwise normal on direct                            and retroflexion views.                           - No specimens collected. Recommendation:           - Repeat colonoscopy in 5 years for surveillance.                           -  Patient has a contact number available for                            emergencies. The signs and symptoms of potential                            delayed complications were discussed with the                            patient. Return to normal activities tomorrow.                            Written discharge instructions were provided to the                            patient.                           -  Resume previous diet.                           - Continue present medications. Ladene Artist, MD 10/14/2020 9:02:40 AM This report has been signed electronically.

## 2020-10-14 NOTE — Progress Notes (Signed)
To PACU, VSS. Report to [email or web address] 

## 2020-10-18 ENCOUNTER — Telehealth: Payer: Self-pay

## 2020-10-18 NOTE — Telephone Encounter (Signed)
  Follow up Call-  Call back number 10/14/2020  Post procedure Call Back phone  # [phone]  Permission to leave phone message Yes  Some recent data might be hidden     Patient questions:  Do you have a fever, pain , or abdominal swelling? No. Pain Score  0 *  Have you tolerated food without any problems? Yes.    Have you been able to return to your normal activities? Yes.    Do you have any questions about your discharge instructions: Diet   No. Medications  No. Follow up visit  No.  Do you have questions or concerns about your Care? No.  Actions: * If pain score is 4 or above: No action needed, pain <4.  Have you developed a fever since your procedure? no  2.   Have you had an respiratory symptoms (SOB or cough) since your procedure? no  3.   Have you tested positive for COVID 19 since your procedure no  4.   Have you had any family members/close contacts diagnosed with the COVID 19 since your procedure?  no   If yes to any of these questions please route to Joylene John, RN and Joella Prince, RN

## 2020-11-09 ENCOUNTER — Other Ambulatory Visit: Payer: Self-pay

## 2020-11-09 ENCOUNTER — Telehealth: Payer: Self-pay | Admitting: Pulmonary Disease

## 2020-11-09 ENCOUNTER — Ambulatory Visit (INDEPENDENT_AMBULATORY_CARE_PROVIDER_SITE_OTHER): Payer: 59

## 2020-11-09 ENCOUNTER — Ambulatory Visit: Payer: 59 | Admitting: Family Medicine

## 2020-11-09 VITALS — BP 132/78 | HR 71 | Ht 75.0 in | Wt 277.8 lb

## 2020-11-09 DIAGNOSIS — M5442 Lumbago with sciatica, left side: Secondary | ICD-10-CM

## 2020-11-09 DIAGNOSIS — M5441 Lumbago with sciatica, right side: Secondary | ICD-10-CM

## 2020-11-09 MED ORDER — PREDNISONE 50 MG PO TABS: 50.0000 mg | ORAL_TABLET | Freq: Every day | ORAL | 0 refills | Status: DC

## 2020-11-09 MED ORDER — TIZANIDINE HCL 4 MG PO TABS: 4.0000 mg | ORAL_TABLET | Freq: Three times a day (TID) | ORAL | 1 refills | Status: DC | PRN

## 2020-11-09 MED ORDER — GABAPENTIN 300 MG PO CAPS: 300.0000 mg | ORAL_CAPSULE | Freq: Three times a day (TID) | ORAL | 3 refills | Status: DC | PRN

## 2020-11-09 NOTE — Telephone Encounter (Signed)
Compliance data reviewed showing 94% compliance with CPAP Average use of 6 hours 32 minutes CPAP set at 13 Residual AHI of 0.8

## 2020-11-09 NOTE — Progress Notes (Signed)
I, Peterson Lombard, LAT, ATC acting as a scribe for Lynne Leader, MD.  Anthony [name] is a 65 y.o. male who presents to [place] at [facility] today for back pain ongoing since Sunday morning, 8/28. Pt was last seen by Dr. Georgina Snell on 09/07/19 for R knee pain. Today, MOI: Pt was on vacation and was loading up to come home, and was bent over looking under the motor home, and felt a sharp, stabbing pain in low back. Pt locates pain to midline low back w/ radiation pain distally to bilat feet/ankles. Pt has a hx of DDD and has been seeing a chiro. Pt drives a truck for work, Forensic scientist. Pt notes he will experience a LB flare about every 5 years.  He cannot work currently because of his back and leg pain.  No saddle anesthesia or weakness distally.  No bowel or bladder dysfunction.\  Radiating pain: yes LE numbness/tingling: yes LE weakness: yes Aggravates: transitioning from sitting, worse in the morning Treatments tried: ice, IBU, TENS unit  Dx imaging: 11/05/03 L-spine MRI  Pertinent review of systems: No fevers or chills  Relevant historical information: Hypertension.  Thoracic aneurysm.  Diabetes currently controlled.   Exam:  BP 132/78   Pulse 71   Ht '6\' 3"'$  (1.905 m)   Wt 277 lb 12.8 oz (126 kg)   SpO2 96%   BMI 34.72 kg/m  General: Well Developed, well nourished, and in no acute distress.   MSK: L-spine nontender midline.  Decreased lumbar motion pain with flexion. Positive bilateral slump test. Lower extremity strength reflexes and sensation are intact distally.    Lab and Radiology Results X-ray images L-spine obtained today personally and independently interpreted.  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  No severe degenerative changes.  Concern acute compression fracture L1 mild appearing Await formal radiology review     Assessment and Plan: 65 y.o. male with acute low back pain.  Majority of pain is in the lower portion low back  without significant point tenderness.  Patient has concern for L4 lumbar radiculopathy.  X-ray is somewhat concerning for an L1 compression fracture which does not fit his current symptoms very well or at least fits his symptoms entirely.  Plan to treat for presumed lumbar radiculopathy bilaterally with prednisone and gabapentin and tizanidine and work note.  If worsening or not improving will proceed to MRI.  Proceed to MRI if radiology confirms compression fracture.   PDMP not reviewed this encounter. Orders Placed This Encounter  Procedures   DG Lumbar Spine 2-3 Views    Standing Status:   Future    Number of Occurrences:   1    Standing Expiration Date:   11/09/2021    Order Specific Question:   Reason for Exam (SYMPTOM  OR DIAGNOSIS REQUIRED)    Answer:   eval low back pain    Order Specific Question:   Preferred imaging location?    Answer:   [facility]   Ambulatory referral to Physical Therapy    Referral Priority:   Routine    Referral Type:   Physical Medicine    Referral Reason:   Specialty Services Required    Requested Specialty:   Physical Therapy    Number of Visits Requested:   1   Meds ordered this encounter  Medications   predniSONE (DELTASONE) 50 MG tablet    Sig: Take 1 tablet (50 mg total) by mouth daily.    Dispense:  5  tablet    Refill:  0   gabapentin (NEURONTIN) 300 MG capsule    Sig: Take 1 capsule (300 mg total) by mouth 3 (three) times daily as needed.    Dispense:  90 capsule    Refill:  3   tiZANidine (ZANAFLEX) 4 MG tablet    Sig: Take 1 tablet (4 mg total) by mouth every 8 (eight) hours as needed for muscle spasms.    Dispense:  30 tablet    Refill:  1     Discussed warning signs or symptoms. Please see discharge instructions. Patient expresses understanding.   The above documentation has been reviewed and is accurate and complete Lynne Leader, M.D.

## 2020-11-09 NOTE — Patient Instructions (Addendum)
Thank you for coming in today.   Please get an Xray today before you leave   I've referred you to Physical Therapy.  Let us know if you don't hear from them in one week.   If not improving or worsening let me know.  Next step if worsening especially is rapid MRI.   Come back or go to the emergency room if you notice new weakness new numbness problems walking or bowel or bladder problems.  Radicular Pain Radicular pain is a type of pain that spreads from your back or neck along a spinal nerve. Spinal nerves are nerves that leave the spinal cord and go to the muscles. Radicular pain is sometimes called radiculopathy, radiculitis, or a pinched nerve. When you have this type of pain, you may also have weakness, numbness, or tingling in the area of your body that is supplied by the nerve. The pain may feel sharp and burning. Depending on which spinal nerve is affected, the pain may occur in the: Neck area (cervical radicular pain). You may also feel pain, numbness, weakness, or tingling in the arms. Mid-spine area (thoracic radicular pain). You would feel this pain in the back and chest. This type is rare. Lower back area (lumbar radicular pain). You would feel this pain as low back pain. You may feel pain, numbness, weakness, or tingling in the buttocks or legs. Sciatica is a type of lumbar radicular pain that shoots down the back of the leg. Radicular pain occurs when one of the spinal nerves becomes irritated or squeezed (compressed). It is often caused by something pushing on a spinal nerve, such as one of the bones of the spine (vertebrae) or one of the round cushions between vertebrae (intervertebral disks). This can result from: An injury. Wear and tear or aging of a disk. The growth of a bone spur that pushes on the nerve. Radicular pain often goes away when you follow instructions from your health care provider for relieving pain at home. Follow these instructions at home: Managing pain    If directed, put ice on the affected area: Put ice in a plastic bag. Place a towel between your skin and the bag. Leave the ice on for 20 minutes, 2-3 times a day. If directed, apply heat to the affected area as often as told by your health care provider. Use the heat source that your health care provider recommends, such as a moist heat pack or a heating pad. Place a towel between your skin and the heat source. Leave the heat on for 20-30 minutes. Remove the heat if your skin turns bright red. This is especially important if you are unable to feel pain, heat, or cold. You may have a greater risk of getting burned. Activity  Do not sit or rest in bed for [place] periods of time. Try to stay as active as possible. Ask your health care provider what type of exercise or activity is best for you. Avoid activities that make your pain worse, such as bending and lifting. Do not lift anything that is heavier than 10 lb (4.5 kg), or the limit that you are told, until your health care provider says that it is safe. Practice using proper technique when lifting items. Proper lifting technique involves bending your knees and rising up. Do strength and range-of-motion exercises only as told by your health care provider or physical therapist. General instructions Take over-the-counter and prescription medicines only as told by your health care provider. Pay attention to  any changes in your symptoms. Keep all follow-up visits as told by your health care provider. This is important. Your health care provider may send you to a physical therapist to help with this pain. Contact a health care provider if: Your pain and other symptoms get worse. Your pain medicine is not helping. Your pain has not improved after a few weeks of home care. You have a fever. Get help right away if: You have severe pain, weakness, or numbness. You have difficulty with bladder or bowel control. Summary Radicular pain is a type of  pain that spreads from your back or neck along a spinal nerve. When you have radicular pain, you may also have weakness, numbness, or tingling in the area of your body that is supplied by the nerve. The pain may feel sharp or burning. Radicular pain may be treated with ice, heat, medicines, or physical therapy. This information is not intended to replace advice given to you by your health care provider. Make sure you discuss any questions you have with your health care provider. Document Revised: 09/10/2017 Document Reviewed: 09/10/2017 Elsevier Patient Education  [place].

## 2020-11-14 ENCOUNTER — Encounter: Payer: Self-pay | Admitting: Family Medicine

## 2020-11-15 NOTE — Progress Notes (Signed)
Lumbar spine x-ray shows arthritis changes worse at L5-S1.

## 2020-11-16 ENCOUNTER — Other Ambulatory Visit: Payer: Self-pay | Admitting: Internal Medicine

## 2020-12-15 ENCOUNTER — Other Ambulatory Visit: Payer: Self-pay | Admitting: Internal Medicine

## 2020-12-16 ENCOUNTER — Encounter: Payer: Self-pay | Admitting: Internal Medicine

## 2020-12-16 ENCOUNTER — Other Ambulatory Visit: Payer: Self-pay

## 2020-12-16 ENCOUNTER — Encounter: Payer: Self-pay | Admitting: Thoracic Surgery (Cardiothoracic Vascular Surgery)

## 2020-12-16 ENCOUNTER — Ambulatory Visit: Payer: 59 | Admitting: Internal Medicine

## 2020-12-16 VITALS — BP 126/68 | HR 69 | Temp 98.1°F | Resp 16 | Ht 75.0 in | Wt 274.4 lb

## 2020-12-16 DIAGNOSIS — Z23 Encounter for immunization: Secondary | ICD-10-CM

## 2020-12-16 DIAGNOSIS — Z8042 Family history of malignant neoplasm of prostate: Secondary | ICD-10-CM

## 2020-12-16 DIAGNOSIS — E119 Type 2 diabetes mellitus without complications: Secondary | ICD-10-CM | POA: Diagnosis not present

## 2020-12-16 DIAGNOSIS — I10 Essential (primary) hypertension: Secondary | ICD-10-CM

## 2020-12-16 LAB — PSA: PSA: 1.28 ng/mL (ref 0.10–4.00)

## 2020-12-16 LAB — HEMOGLOBIN A1C: Hgb A1c MFr Bld: 7.7 % — ABNORMAL HIGH (ref 4.6–6.5)

## 2020-12-16 NOTE — Patient Instructions (Signed)
   GO TO THE LAB : Get the blood work     GO TO THE FRONT DESK, [place] Come back for   a physical exam in 4 months

## 2020-12-16 NOTE — Progress Notes (Signed)
Subjective:    Patient ID: Anthony [name], male    DOB: 26-Oct-1955, 65 y.o.   MRN: [id number]  DOS:  12/16/2020 Type of visit - description: Follow-up  Since the last office visit is eating healthier,  has "cut back". Ambulatory CBGs remain somewhat elevated. + Stress, family related  Wt Readings from Last 3 Encounters:  12/16/20 274 lb 6 oz (124.5 kg)  11/09/20 277 lb 12.8 oz (126 kg)  10/14/20 278 lb (126.1 kg)     Review of Systems Denies dysuria, gross hematuria or difficulty urinating  Past Medical History:  Diagnosis Date   Aortic root enlargement (HCC)    CT 42 mm 2014   Chronic back pain    in 2022 pt states "it ended Natal ago"   Diabetes mellitus    type II   DJD (degenerative joint disease)    per MRI of LS spine 2005   GERD (gastroesophageal reflux disease)    Hyperlipidemia    Hypertension    Hypogonadism male    dx w/ hypogonadism elsewhere ~ 2011   Normal cardiac stress test    (-) stress test 2003, 2006   S/P cardiac cath     04-2006 neg per pt--CP improved w/ PPIs   Sleep apnea    uses cpap   Tachycardia    ER 07-2012, ?SCR, on atenolol    Past Surgical History:  Procedure Laterality Date   CARDIAC CATHETERIZATION     COLONOSCOPY     CYSTECTOMY     from back   KNEE ARTHROSCOPY WITH MENISCAL REPAIR Right 2021   POLYPECTOMY      Allergies as of 12/16/2020   No Known Allergies      Medication List        Accurate as of December 16, 2020 11:59 PM. If you have any questions, ask your nurse or doctor.          STOP taking these medications    predniSONE 50 MG tablet Commonly known as: DELTASONE Stopped by: Kathlene November, MD   tiZANidine 4 MG tablet Commonly known as: ZANAFLEX Stopped by: Kathlene November, MD       TAKE these medications    aspirin 81 MG tablet Take 81 mg by mouth daily.   atenolol 50 MG tablet Commonly known as: TENORMIN TAKE 1/2 TABLET BY MOUTH EVERY DAY   gabapentin 300 MG capsule Commonly known as: NEURONTIN Take  1 capsule (300 mg total) by mouth 3 (three) times daily as needed.   hydrochlorothiazide 25 MG tablet Commonly known as: HYDRODIURIL Take 1 tablet (25 mg total) by mouth daily.   Januvia 100 MG tablet Generic drug: sitaGLIPtin TAKE 1 TABLET BY MOUTH EVERY DAY   losartan 25 MG tablet Commonly known as: COZAAR TAKE 1 TABLET BY MOUTH EVERY DAY   metFORMIN 850 MG tablet Commonly known as: GLUCOPHAGE TAKE 2 TABLETS BY MOUTH DAILY WITH BREAKFAST AND 1 TABLET DAILY WITH SUPPER.   OneTouch Ultra test strip Generic drug: glucose blood CHECK BLOOD SUGAR NO MORE THAN TWICE DAILY. DX IS E11.9. MAX ON INSURANCE   pioglitazone 30 MG tablet Commonly known as: ACTOS TAKE 1 TABLET BY MOUTH EVERY DAY   pravastatin 20 MG tablet Commonly known as: PRAVACHOL TAKE 1 TABLET BY MOUTH EVERY DAY           Objective:   Physical Exam BP 126/68 (BP Location: Left Arm, Patient Position: Sitting, Cuff Size: Normal)   Pulse 69   Temp 98.1  F (36.7 C) (Oral)   Resp 16   Ht 6\' 3"  (1.905 m)   Wt 274 lb 6 oz (124.5 kg)   SpO2 96%   BMI 34.29 kg/m  General:   Well developed, NAD, BMI noted. HEENT:  Normocephalic . Face symmetric, atraumatic DRE: Normal sphincter tone, brown stools, prostate normal Skin: Not pale. Not jaundice Neurologic:  alert & oriented X3.  Speech normal, gait appropriate for age and unassisted Psych--  Cognition and judgment appear intact.  Cooperative with normal attention span and concentration.  Behavior appropriate. No anxious or depressed appearing.      Assessment    Assessment  DM HTN Hyperlipidemia CV Dr [name], next visit 08-2016 --Tachycardia : ER eval 2014,   rx atenolol --stress test 2003, 2006; Chest pain-2008,  (-) cardiac cath, sx decrease with PPIs ---Aorta enlargment per CT  2014, saw surgery 12-2016, they will cont observation OSA , severe - on Cpap GERD DJD, Chronic back pain, back MRI 2005 >>  OA Hypogonadism DX 2011, 2013:  FSH, LH and  prolactin, declined Endo referral + FH Prostate ca brother age 84    PLAN   DM: Last A1c increased from 6.9-7.5, meds didn't change, was rec to work on lifestyle, he has cut down on portions, eating only once daily for the most part.  Ambulatory CBGs fasting: 120 and occasionally higher (150, 160) Plan: Continue metformin, Actos, Januvia. Intermittent fasting is okay if so desired. Check A1c, further advised with results HTN: On Tenormin, HCTZ, losartan.  BP is very good, no change Stress: Family related, listening therapy provided. Preventive care: Shingrix No. 2 today Declined flu shot today COVID-vaccine booster rec FH prostate cancer, no symptoms, DRE normal, check a PSA RTC CPX 4 months   This visit occurred during the SARS-CoV-2 public health emergency.  Safety protocols were in place, including screening questions prior to the visit, additional usage of staff PPE, and extensive cleaning of exam room while observing appropriate contact time as indicated for disinfecting solutions.

## 2020-12-18 NOTE — Assessment & Plan Note (Signed)
DM: Last A1c increased from 6.9-7.5, meds didn't change, was rec to work on lifestyle, he has cut down on portions, eating only once daily for the most part.  Ambulatory CBGs fasting: 120 and occasionally higher (150, 160) Plan: Continue metformin, Actos, Januvia. Intermittent fasting is okay if so desired. Check A1c, further advised with results HTN: On Tenormin, HCTZ, losartan.  BP is very good, no change Stress: Family related, listening therapy provided. Preventive care: Shingrix No. 2 today Declined flu shot today COVID-vaccine booster rec FH prostate cancer, no symptoms, DRE normal, check a PSA RTC CPX 4 months

## 2020-12-19 MED ORDER — RYBELSUS 7 MG PO TABS: 7.0000 mg | ORAL_TABLET | Freq: Every day | ORAL | 0 refills | Status: DC

## 2020-12-19 NOTE — Addendum Note (Signed)
Addended by: Damita Dunnings D on: 12/19/2020 07:36 AM   Modules accepted: Orders

## 2020-12-28 ENCOUNTER — Encounter: Payer: Self-pay | Admitting: Internal Medicine

## 2021-01-17 ENCOUNTER — Encounter: Payer: Self-pay | Admitting: Internal Medicine

## 2021-01-17 MED ORDER — RYBELSUS 14 MG PO TABS: 14.0000 mg | ORAL_TABLET | Freq: Every day | ORAL | 2 refills | Status: DC

## 2021-01-25 ENCOUNTER — Encounter: Payer: Self-pay | Admitting: Internal Medicine

## 2021-03-01 ENCOUNTER — Encounter: Payer: Self-pay | Admitting: Cardiovascular Disease

## 2021-03-01 NOTE — Telephone Encounter (Signed)
Called patient about message. Patient complaining of SOB, fatigue and chest pain that comes and goes here and there. Patient stated he has a family history of CAD and is just worried. Patient stated he is feeling okay, and he does not think he needs to go to ED. Made patient appointment with DOD, Dr. Marlou Porch on Friday.

## 2021-03-03 ENCOUNTER — Other Ambulatory Visit: Payer: Self-pay

## 2021-03-03 ENCOUNTER — Ambulatory Visit: Payer: 59 | Admitting: Cardiology

## 2021-03-03 ENCOUNTER — Encounter: Payer: Self-pay | Admitting: Cardiology

## 2021-03-03 VITALS — BP 118/88 | HR 70 | Ht 75.0 in | Wt 280.6 lb

## 2021-03-03 DIAGNOSIS — I10 Essential (primary) hypertension: Secondary | ICD-10-CM | POA: Diagnosis not present

## 2021-03-03 DIAGNOSIS — R072 Precordial pain: Secondary | ICD-10-CM | POA: Diagnosis not present

## 2021-03-03 DIAGNOSIS — I7121 Aneurysm of the ascending aorta, without rupture: Secondary | ICD-10-CM | POA: Diagnosis not present

## 2021-03-03 DIAGNOSIS — R002 Palpitations: Secondary | ICD-10-CM | POA: Insufficient documentation

## 2021-03-03 DIAGNOSIS — R079 Chest pain, unspecified: Secondary | ICD-10-CM | POA: Diagnosis not present

## 2021-03-03 DIAGNOSIS — Z01812 Encounter for preprocedural laboratory examination: Secondary | ICD-10-CM

## 2021-03-03 MED ORDER — METOPROLOL TARTRATE 100 MG PO TABS: 100.0000 mg | ORAL_TABLET | Freq: Once | ORAL | 0 refills | Status: DC

## 2021-03-03 NOTE — Assessment & Plan Note (Signed)
Treated well.  Wear CPAP.

## 2021-03-03 NOTE — Patient Instructions (Addendum)
Medication Instructions:  The current medical regimen is effective;  continue present plan and medications.  *If you need a refill on your cardiac medications before your next appointment, please call your pharmacy*  Lab Work: Please have blood work today (BMP)  If you have labs (blood work) drawn today and your tests are completely normal, you will receive your results only by: MyChart Message (if you have MyChart) OR A paper copy in the mail If you have any lab test that is abnormal or we need to change your treatment, we will call you to review the results.   Testing/Procedures:   Your cardiac CT will be scheduled at:   [facility] [address] [place], [place] [address] [phone]  Please arrive at the [facility] main entrance (entrance A) of [facility] 30 minutes prior to test start time. You can use the FREE valet parking offered at the main entrance (encouraged to control the heart rate for the test) Proceed to the [facility] Radiology Department (first floor) to check-in and test prep.  Please follow these instructions carefully (unless otherwise directed):  Hold all erectile dysfunction medications at least 3 days (72 hrs) prior to test.  On the Night Before the Test: Be sure to Drink plenty of water. Do not consume any caffeinated/decaffeinated beverages or chocolate 12 hours prior to your test. Do not take any antihistamines 12 hours prior to your test.  On the Day of the Test: Drink plenty of water until 1 hour prior to the test. Do not eat any food 4 hours prior to the test. You may take your regular medications prior to the test.  Take metoprolol (Lopressor) two hours prior to test. HOLD Furosemide/Hydrochlorothiazide morning of the test.      After the Test: Drink plenty of water. After receiving IV contrast, you may experience a mild flushed feeling. This is normal. On occasion, you may experience a mild rash up to 24 hours  after the test. This is not dangerous. If this occurs, you can take Benadryl 25 mg and increase your fluid intake. If you experience trouble breathing, this can be serious. If it is severe call 911 IMMEDIATELY. If it is mild, please call our office.  Please allow 2-4 weeks for scheduling of routine cardiac CTs. Some insurance companies require a pre-authorization which may delay scheduling of this test.   For non-scheduling related questions, please contact the cardiac imaging nurse navigator should you have any questions/concerns: Marchia Bond, Cardiac Imaging Nurse Navigator Gordy Clement, Cardiac Imaging Nurse Navigator [place] [facility] Direct Office Dial: [phone]   For scheduling needs, including cancellations and rescheduling, please call [place], [phone].  Cardiac CT Angiography (CTA), is a special type of CT scan that uses a computer to produce multi-dimensional views of major blood vessels throughout the body. In CT angiography, a contrast material is injected through an IV to help visualize the blood vessels.  This testing will be completed the same day as your Coronary CT scan above.  Follow-Up: At [facility], you and your health needs are our priority.  As part of our continuing mission to provide you with exceptional heart care, we have created designated Provider Care Teams.  These Care Teams include your primary Cardiologist (physician) and Advanced Practice Providers (APPs -  Physician Assistants and Nurse Practitioners) who all work together to provide you with the care you need, when you need it.  We recommend signing up for the patient  portal called "MyChart".  Sign up information is provided on this After Visit Summary.  MyChart is used to connect with patients for Virtual Visits (Telemedicine).  Patients are able to view lab/test results, encounter notes, upcoming appointments, etc.  Non-urgent messages can be sent to your provider as well.    To learn more about what you can do with MyChart, go to [email or web address].    Your next appointment:   Follow up with Dr Jenkins Rouge as scheduled.  Thank you for choosing Home!!

## 2021-03-03 NOTE — Progress Notes (Signed)
Cardiology Office Note:    Date:  03/03/2021   ID:  Anthony [name], DOB 28-Sep-1955, MRN [id number]  PCP:  Colon Branch, MD   [facility] Providers Cardiologist:  Jenkins Rouge, MD     Referring MD: Colon Branch, MD    History of Present Illness:    Anthony [name] is a 65 y.o. male with family history of CAD here with shortness of breath fatigue and chest pain that comes and goes. Feels a sharp intense pain deep in chest, center to left. Tired all of the time. Treated for OSA with CPAP.  Does not seem to be exertional.  Can happen at rest.  Brother had CABG in 1998.  He is worried about his family history.  Had normal heart cath in 2008.  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  Dr. Roxan Hockey following as well.    Past Medical History:  Diagnosis Date   Aortic root enlargement ([place])    CT 42 mm 2014   Chronic back pain    in 2022 pt states "it ended [place] ago"   Diabetes mellitus    type II   DJD (degenerative joint disease)    per MRI of LS spine 2005   GERD (gastroesophageal reflux disease)    Hyperlipidemia    Hypertension    Hypogonadism male    dx w/ hypogonadism elsewhere ~ 2011   Normal cardiac stress test    (-) stress test 2003, 2006   S/P cardiac cath     04-2006 neg per pt--CP improved w/ PPIs   Sleep apnea    uses cpap   Tachycardia    ER 07-2012, ?SCR, on atenolol    Past Surgical History:  Procedure Laterality Date   CARDIAC CATHETERIZATION     COLONOSCOPY     CYSTECTOMY     from back   KNEE ARTHROSCOPY WITH MENISCAL REPAIR Right 2021   POLYPECTOMY      Current Medications: Current Meds  Medication Sig   aspirin 81 MG tablet Take 81 mg by mouth daily.   atenolol (TENORMIN) 50 MG tablet TAKE 1/2 TABLET BY MOUTH EVERY DAY   hydrochlorothiazide (HYDRODIURIL) 25 MG tablet Take 1 tablet (25 mg total) by mouth daily.   losartan (COZAAR) 25 MG tablet TAKE 1 TABLET BY MOUTH EVERY DAY   metFORMIN (GLUCOPHAGE) 850 MG tablet TAKE 2 TABLETS BY MOUTH  DAILY WITH BREAKFAST AND 1 TABLET DAILY WITH SUPPER.   metoprolol tartrate (LOPRESSOR) 100 MG tablet Take 1 tablet (100 mg total) by mouth once for 1 dose. Take one tablet 2 hrs before your CT scan   ONETOUCH ULTRA test strip CHECK BLOOD SUGAR NO MORE THAN TWICE DAILY. DX IS E11.9. MAX ON INSURANCE   pioglitazone (ACTOS) 30 MG tablet TAKE 1 TABLET BY MOUTH EVERY DAY   pravastatin (PRAVACHOL) 20 MG tablet TAKE 1 TABLET BY MOUTH EVERY DAY   sitaGLIPtin (JANUVIA) 100 MG tablet Take 1 tablet (100 mg total) by mouth daily.     Allergies:   Patient has no known allergies.   Social History   Socioeconomic History   Marital status: Married    Spouse name: Not on file   Number of children: 2   Years of education: Not on file   Highest education level: Not on file  Occupational History   Occupation: TRUCK DRIVER  Tobacco Use   Smoking status: Former    Types: Cigars    Quit date: 03/12/1981  Years since quitting: 40.0   Smokeless tobacco: Never  Vaping Use   Vaping Use: Never used  Substance and Sexual Activity   Alcohol use: Yes    Alcohol/week: 1.0 standard drink    Types: 1 Cans of beer per week    Comment: occasionally less than weekly   Drug use: No   Sexual activity: Not on file  Other Topics Concern   Not on file  Social History Narrative   Truck driver (local); lives w/ wife   Social Determinants of Health   Financial Resource Strain: Not on file  Food Insecurity: Not on file  Transportation Needs: Not on file  Physical Activity: Not on file  Stress: Not on file  Social Connections: Not on file     Family History: The patient's family history includes Bladder Cancer in his paternal uncle; Colon polyps in his brother; Diabetes in an other family member; Heart disease in his brother and father; Prostate cancer (age of onset: 90) in his brother; Sinusitis in his brother; Stomach cancer in his paternal uncle. There is no history of Colon cancer, Rectal cancer, or  Esophageal cancer.  ROS:   Please see the history of present illness.    No fevers chills nausea vomiting syncope bleeding all other systems reviewed and are negative.  EKGs/Labs/Other Studies Reviewed:    The following studies were reviewed today: Prior MRIs, CT of chest reviewed.  Last coronary CT 2016.  Normal Last MRI showed measurement of aorta 3.3 cm, question discrepancy Prior office notes reviewed, data sets reviewed  EKG:  EKG is  ordered today.  The ekg ordered today demonstrates sinus rhythm 70 with PVC, no ischemic rhythm mild first-degree AV block 220 ms  Recent Labs: 08/12/2020: BUN 16; Creatinine, Ser 0.84; Hemoglobin 13.4; Platelets 267.0; Potassium 4.2; Sodium 138  Recent Lipid Panel    Component Value Date/Time   CHOL 122 04/08/2020 0900   TRIG 82.0 04/08/2020 0900   HDL 35.10 (L) 04/08/2020 0900   CHOLHDL 3 04/08/2020 0900   VLDL 16.4 04/08/2020 0900   LDLCALC 70 04/08/2020 0900     Risk Assessment/Calculations:              Physical Exam:    VS:  BP 118/88    Pulse 70    Ht 6\' 3"  (1.905 m)    Wt 280 lb 9.6 oz (127.3 kg)    SpO2 95%    BMI 35.07 kg/m     Wt Readings from Last 3 Encounters:  03/03/21 280 lb 9.6 oz (127.3 kg)  12/16/20 274 lb 6 oz (124.5 kg)  11/09/20 277 lb 12.8 oz (126 kg)     GEN:  Well nourished, well developed in no acute distress HEENT: Normal NECK: No JVD; No carotid bruits LYMPHATICS: No lymphadenopathy CARDIAC: RRR, no murmurs, no rubs, gallops RESPIRATORY:  Clear to auscultation without rales, wheezing or rhonchi  ABDOMEN: Soft, non-tender, non-distended MUSCULOSKELETAL:  No edema; No deformity  SKIN: Warm and dry NEUROLOGIC:  Alert and oriented x 3 PSYCHIATRIC:  Normal affect   ASSESSMENT:    1. Precordial pain   2. Chest pain of uncertain etiology   3. Essential hypertension   4. Aneurysm of ascending aorta without rupture   5. Pre-procedure lab exam   6. Palpitations    PLAN:    In order of problems  listed above:  Chest pain of uncertain etiology We will go ahead and check a cardiac CT with possible FFR.  Prior scan  in 2016 was unremarkable.  Strong family history.  Thoracic aortic aneurysm without rupture ([place]) At the same time as cardiac CT we will check a CTA of chest to evaluate his aorta.  He has an ascending aortic aneurysm measured as high as 4.6 cm in the past.  Previous MRI measurement seems spurious at 3.3 cm.  Excellent blood pressure control.  Medications reviewed.  OSA (obstructive sleep apnea) Treated well.  Wear CPAP.  Palpitations Occasionally will feel a racing heart like sensation.  1 time when taking his blood pressure and his heart rate was 110.  If this continues, consider Zio patch monitor.  Remote event monitor placed in the past.         Medication Adjustments/Labs and Tests Ordered: Current medicines are reviewed at length with the patient today.  Concerns regarding medicines are outlined above.  Orders Placed This Encounter  Procedures   CT CORONARY MORPH W/CTA COR W/SCORE W/CA W/CM &/OR WO/CM   CT ANGIO CHEST AORTA W/CM & OR WO/CM   Basic metabolic panel   EKG 37-CHYI   Meds ordered this encounter  Medications   metoprolol tartrate (LOPRESSOR) 100 MG tablet    Sig: Take 1 tablet (100 mg total) by mouth once for 1 dose. Take one tablet 2 hrs before your CT scan    Dispense:  1 tablet    Refill:  0    Patient Instructions  Medication Instructions:  The current medical regimen is effective;  continue present plan and medications.  *If you need a refill on your cardiac medications before your next appointment, please call your pharmacy*  Lab Work: Please have blood work today (BMP)  If you have labs (blood work) drawn today and your tests are completely normal, you will receive your results only by: MyChart Message (if you have MyChart) OR A paper copy in the mail If you have any lab test that is abnormal or we need to change your treatment,  we will call you to review the results.   Testing/Procedures:   Your cardiac CT will be scheduled at:   [facility] [address] [place], [place] [address] [phone]  Please arrive at the [facility] main entrance (entrance A) of [facility] 30 minutes prior to test start time. You can use the FREE valet parking offered at the main entrance (encouraged to control the heart rate for the test) Proceed to the [facility] Radiology Department (first floor) to check-in and test prep.  Please follow these instructions carefully (unless otherwise directed):  Hold all erectile dysfunction medications at least 3 days (72 hrs) prior to test.  On the Night Before the Test: Be sure to Drink plenty of water. Do not consume any caffeinated/decaffeinated beverages or chocolate 12 hours prior to your test. Do not take any antihistamines 12 hours prior to your test.  On the Day of the Test: Drink plenty of water until 1 hour prior to the test. Do not eat any food 4 hours prior to the test. You may take your regular medications prior to the test.  Take metoprolol (Lopressor) two hours prior to test. HOLD Furosemide/Hydrochlorothiazide morning of the test.      After the Test: Drink plenty of water. After receiving IV contrast, you may experience a mild flushed feeling. This is normal. On occasion, you may experience a mild rash up to 24 hours after the test. This is not dangerous. If this occurs, you can take Benadryl 25 mg  and increase your fluid intake. If you experience trouble breathing, this can be serious. If it is severe call 911 IMMEDIATELY. If it is mild, please call our office.  Please allow 2-4 weeks for scheduling of routine cardiac CTs. Some insurance companies require a pre-authorization which may delay scheduling of this test.   For non-scheduling related questions, please contact the cardiac imaging nurse navigator should you have any  questions/concerns: Marchia Bond, Cardiac Imaging Nurse Navigator Gordy Clement, Cardiac Imaging Nurse Navigator  [facility] Direct Office Dial: [phone]   For scheduling needs, including cancellations and rescheduling, please call [place], [phone].  Cardiac CT Angiography (CTA), is a special type of CT scan that uses a computer to produce multi-dimensional views of major blood vessels throughout the body. In CT angiography, a contrast material is injected through an IV to help visualize the blood vessels.  This testing will be completed the same day as your Coronary CT scan above.  Follow-Up: At [facility], you and your health needs are our priority.  As part of our continuing mission to provide you with exceptional heart care, we have created designated Provider Care Teams.  These Care Teams include your primary Cardiologist (physician) and Advanced Practice Providers (APPs -  Physician Assistants and Nurse Practitioners) who all work together to provide you with the care you need, when you need it.  We recommend signing up for the patient portal called "MyChart".  Sign up information is provided on this After Visit Summary.  MyChart is used to connect with patients for Virtual Visits (Telemedicine).  Patients are able to view lab/test results, encounter notes, upcoming appointments, etc.  Non-urgent messages can be sent to your provider as well.   To learn more about what you can do with MyChart, go to [email or web address].    Your next appointment:   Follow up with Dr Jenkins Rouge as scheduled.  Thank you for choosing [facility]!!      Signed, Candee Furbish, MD  03/03/2021 3:49 PM    [place]

## 2021-03-03 NOTE — Assessment & Plan Note (Signed)
We will go ahead and check a cardiac CT with possible FFR.  Prior scan in 2016 was unremarkable.  Strong family history.

## 2021-03-03 NOTE — Assessment & Plan Note (Signed)
Occasionally will feel a racing heart like sensation.  1 time when taking his blood pressure and his heart rate was 110.  If this continues, consider Zio patch monitor.  Remote event monitor placed in the past.

## 2021-03-03 NOTE — Assessment & Plan Note (Signed)
At the same time as cardiac CT we will check a CTA of chest to evaluate his aorta.  He has an ascending aortic aneurysm measured as high as 4.6 cm in the past.  Previous MRI measurement seems spurious at 3.3 cm.  Excellent blood pressure control.  Medications reviewed.

## 2021-03-04 LAB — BASIC METABOLIC PANEL
BUN/Creatinine Ratio: 18 (ref 10–24)
BUN: 19 mg/dL (ref 8–27)
CO2: 28 mmol/L (ref 20–29)
Calcium: 9.4 mg/dL (ref 8.6–10.2)
Chloride: 99 mmol/L (ref 96–106)
Creatinine, Ser: 1.08 mg/dL (ref 0.76–1.27)
Glucose: 139 mg/dL — ABNORMAL HIGH (ref 70–99)
Potassium: 4.6 mmol/L (ref 3.5–5.2)
Sodium: 141 mmol/L (ref 134–144)
eGFR: 77 mL/min/{1.73_m2} (ref >59)

## 2021-03-06 ENCOUNTER — Other Ambulatory Visit: Payer: Self-pay | Admitting: Internal Medicine

## 2021-03-10 ENCOUNTER — Encounter: Payer: Self-pay | Admitting: Internal Medicine

## 2021-03-10 MED ORDER — ONETOUCH ULTRA VI STRP: ORAL_STRIP | 12 refills | Status: DC

## 2021-03-14 ENCOUNTER — Other Ambulatory Visit: Payer: Self-pay | Admitting: Cardiovascular Disease

## 2021-03-14 ENCOUNTER — Other Ambulatory Visit: Payer: Self-pay | Admitting: Internal Medicine

## 2021-03-17 ENCOUNTER — Telehealth (HOSPITAL_COMMUNITY): Payer: Self-pay | Admitting: *Deleted

## 2021-03-17 NOTE — Telephone Encounter (Signed)
Reaching out to patient to offer assistance regarding upcoming cardiac imaging study; pt verbalizes understanding of appt date/time, parking situation and where to check in, pre-test NPO status and medications ordered, and verified current allergies; name and call back number provided for further questions should they arise  Anthony Clement RN Navigator [facility] [phone] office [phone] cell  Patient to hold his daily atenolol and take 100mg  metoprolol two hours prior to cardiac CT scan. He is aware to arrive at 11:30am for his noon scan.

## 2021-03-20 ENCOUNTER — Ambulatory Visit (HOSPITAL_COMMUNITY)
Admission: RE | Admit: 2021-03-20 | Discharge: 2021-03-20 | Disposition: A | Discharge status: Home / Self Care | Payer: 59 | Source: Ambulatory Visit | Attending: Cardiology | Admitting: Cardiology

## 2021-03-20 ENCOUNTER — Other Ambulatory Visit: Payer: Self-pay

## 2021-03-20 DIAGNOSIS — I7121 Aneurysm of the ascending aorta, without rupture: Secondary | ICD-10-CM | POA: Diagnosis present

## 2021-03-20 DIAGNOSIS — R079 Chest pain, unspecified: Secondary | ICD-10-CM | POA: Insufficient documentation

## 2021-03-20 DIAGNOSIS — R072 Precordial pain: Secondary | ICD-10-CM | POA: Insufficient documentation

## 2021-03-20 MED ORDER — NITROGLYCERIN 0.4 MG SL SUBL
0.8000 mg | SUBLINGUAL_TABLET | Freq: Once | SUBLINGUAL | Status: AC
  Administered 2021-03-20: 0.8 mg via SUBLINGUAL

## 2021-03-20 MED ORDER — NITROGLYCERIN 0.4 MG SL SUBL
SUBLINGUAL_TABLET | SUBLINGUAL | Status: AC
  Filled 2021-03-20: qty 2

## 2021-03-20 MED ORDER — IOHEXOL 350 MG/ML SOLN
100.0000 mL | Freq: Once | INTRAVENOUS | Status: AC | PRN
  Administered 2021-03-20: 100 mL via INTRAVENOUS

## 2021-03-22 ENCOUNTER — Telehealth: Payer: Self-pay | Admitting: Cardiology

## 2021-03-22 MED ORDER — ROSUVASTATIN CALCIUM 20 MG PO TABS: 20.0000 mg | ORAL_TABLET | Freq: Every day | ORAL | 3 refills | Status: DC

## 2021-03-22 NOTE — Telephone Encounter (Signed)
1. Coronary calcium score of 169. This was 69th percentile for age-,  race-, and sex-matched controls.     2. Normal coronary origin with right dominance.     3.  Minimal (<25%) plaque in the LAD, LCX, and RCA.     4. The aortic root is dilated to 4.3 cm. Ascending aorta is normal  caliber.   Change pravastatin 20 to Crestor 20mg  PO QD for further ongoing protection.  Candee Furbish, MD

## 2021-03-22 NOTE — Telephone Encounter (Signed)
Called pt and reviewed results.  We discussed need to change from pravastatin to crestor 20 mg daily.  RX sent into [facility] as requested.  We discussed diet/exercise and ways to start making needed changes.  Pt states understanding.  He will f/u with Dr [name] as scheduled and c/b if any further questions before then.Marland Kitchen

## 2021-03-22 NOTE — Telephone Encounter (Signed)
Anthony [name] is calling requesting to a callback to go over his CT results and the next steps moving forward.

## 2021-03-31 ENCOUNTER — Encounter: Payer: Self-pay | Admitting: Internal Medicine

## 2021-03-31 LAB — HM DIABETES EYE EXAM

## 2021-04-13 ENCOUNTER — Other Ambulatory Visit: Payer: Self-pay | Admitting: Internal Medicine

## 2021-04-14 ENCOUNTER — Ambulatory Visit (INDEPENDENT_AMBULATORY_CARE_PROVIDER_SITE_OTHER): Payer: 59 | Admitting: Internal Medicine

## 2021-04-14 ENCOUNTER — Encounter: Payer: Self-pay | Admitting: Internal Medicine

## 2021-04-14 VITALS — BP 126/70 | HR 69 | Temp 97.8°F | Resp 18 | Ht 75.0 in | Wt 270.5 lb

## 2021-04-14 DIAGNOSIS — E785 Hyperlipidemia, unspecified: Secondary | ICD-10-CM | POA: Diagnosis not present

## 2021-04-14 DIAGNOSIS — E119 Type 2 diabetes mellitus without complications: Secondary | ICD-10-CM

## 2021-04-14 DIAGNOSIS — I10 Essential (primary) hypertension: Secondary | ICD-10-CM

## 2021-04-14 DIAGNOSIS — Z0000 Encounter for general adult medical examination without abnormal findings: Secondary | ICD-10-CM | POA: Diagnosis not present

## 2021-04-14 LAB — HEMOGLOBIN A1C: Hgb A1c MFr Bld: 7.3 % — ABNORMAL HIGH (ref 4.6–6.5)

## 2021-04-14 LAB — TSH: TSH: 1 u[IU]/mL (ref 0.35–5.50)

## 2021-04-14 NOTE — Assessment & Plan Note (Signed)
-  Tdap 05/2018 - zostavax 2014, s/p shingrix - pnm shot 2014; prevnar 05-2015 - covid vaccine:  booster rec -+ FH Prostate cancer: DRE normal today, recent PSA normal. -CCS: s/p multiple  Cscopes,   11-2013,   02/2017 + polyps, cscope 10-2020, next per GI -FH CAD: Based on recent calcium coronary score, Pravachol was changed to rosuvastatin. -diet  and exercise: + wt loss, portion control, avoiding fast food, praised. -LABS: - Today: A1c, TSH, -Fasting in 6 weeks LFTs, FLP  -Advance care planning information provided

## 2021-04-14 NOTE — Progress Notes (Signed)
Cardiology Office Note:    Date:  04/21/2021   ID:  Anthony [name], DOB May 30, 1955, MRN [id number]  PCP:  Colon Branch, MD   [facility] Providers Cardiologist:  Jenkins Rouge, MD     Referring MD: Colon Branch, MD    History of Present Illness:    Anthony [name] is a 66 y.o. male with family history of CAD Seen by Dr Marlou Porch for atypical chest pain and dyspnea 03/03/21  Treated for OSA with CPAP.     Brother had CABG in 1998.  He is worried about his family history.  Had normal heart cath in 2008.  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  Dr. Roxan Hockey following as well.  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 Weight down some     Past Medical History:  Diagnosis Date   Aortic root enlargement (HCC)    CT 42 mm 2014   Chronic back pain    in 2022 pt states "it ended Menser ago"   Diabetes mellitus    type II   DJD (degenerative joint disease)    per MRI of LS spine 2005   GERD (gastroesophageal reflux disease)    Hyperlipidemia    Hypertension    Hypogonadism male    dx w/ hypogonadism elsewhere ~ 2011   Normal cardiac stress test    (-) stress test 2003, 2006   S/P cardiac cath     04-2006 neg per pt--CP improved w/ PPIs   Sleep apnea    uses cpap   Tachycardia    ER 07-2012, ?SCR, on atenolol    Past Surgical History:  Procedure Laterality Date   CARDIAC CATHETERIZATION     COLONOSCOPY     CYSTECTOMY     from back   KNEE ARTHROSCOPY WITH MENISCAL REPAIR Right 2021   POLYPECTOMY      Current Medications: Current Meds  Medication Sig   aspirin 81 MG tablet Take 81 mg by mouth daily.   atenolol (TENORMIN) 50 MG tablet TAKE 1/2 TABLET BY MOUTH EVERY DAY   glucose blood (ONETOUCH ULTRA) test strip CHECK BLOOD SUGAR NO MORE THAN TWICE DAILY. DX IS E11.9. MAX ON INSURANCE   hydrochlorothiazide (HYDRODIURIL) 25 MG tablet TAKE 1 TABLET (25 MG TOTAL) BY  MOUTH DAILY.   losartan (COZAAR) 25 MG tablet TAKE 1 TABLET BY MOUTH EVERY DAY   metFORMIN (GLUCOPHAGE) 850 MG tablet TAKE 2 TABLETS BY MOUTH DAILY WITH BREAKFAST AND 1 TABLET DAILY WITH SUPPER.   metoprolol tartrate (LOPRESSOR) 100 MG tablet Take 1 tablet (100 mg total) by mouth once for 1 dose. Take one tablet 2 hrs before your CT scan   pioglitazone (ACTOS) 30 MG tablet TAKE 1 TABLET BY MOUTH EVERY DAY   rosuvastatin (CRESTOR) 20 MG tablet Take 1 tablet (20 mg total) by mouth daily.   sitaGLIPtin (JANUVIA) 100 MG tablet TAKE 1 TABLET BY MOUTH EVERY DAY     Allergies:   Patient has no known allergies.   Social History   Socioeconomic History   Marital status: Married    Spouse name: Not on file   Number of children: 2   Years of education: Not on file   Highest education level: Not on file  Occupational History   Occupation: TRUCK DRIVER  Tobacco Use   Smoking status: Former    Types: Landscape architect  Quit date: 03/12/1981    Years since quitting: 40.1   Smokeless tobacco: Never  Vaping Use   Vaping Use: Never used  Substance and Sexual Activity   Alcohol use: Yes    Alcohol/week: 1.0 standard drink    Types: 1 Cans of beer per week    Comment: occasionally less than weekly   Drug use: No   Sexual activity: Not on file  Other Topics Concern   Not on file  Social History Narrative   Truck driver (local); lives w/ wife   Social Determinants of Health   Financial Resource Strain: Not on file  Food Insecurity: Not on file  Transportation Needs: Not on file  Physical Activity: Not on file  Stress: Not on file  Social Connections: Not on file     Family History: The patient's family history includes Bladder Cancer in his paternal uncle; Colon polyps in his brother; Diabetes in an other family member; Heart disease in his brother and father; Prostate cancer (age of onset: 4) in his brother; Sinusitis in his brother; Stomach cancer in his paternal uncle. There is no history of  Colon cancer, Rectal cancer, or Esophageal cancer.  ROS:   Please see the history of present illness.    No fevers chills nausea vomiting syncope bleeding all other systems reviewed and are negative.  EKGs/Labs/Other Studies Reviewed:    Cardiac CTA 2021/04/08 see HPI Echo 09/06/15: Study Conclusions   - Left ventricle: The cavity size was normal. There was mild focal    basal hypertrophy of the septum. Systolic function was vigorous.    The estimated ejection fraction was in the range of 65% to 70%.    Wall motion was normal; there were no regional wall motion    abnormalities. Features are consistent with a pseudonormal left    ventricular filling pattern, with concomitant abnormal relaxation    and increased filling pressure (grade 2 diastolic dysfunction).  - Aorta: Aortic root dimension: 44 mm (ED) sinus of Valsalva.  - Aortic root: The aortic root was moderately dilated.  - Left atrium: The atrium was mildly dilated. Volume/bsa, ES,    (1-plane Simpson&'s, A2C): 35.8 ml/m^2.  - Right ventricle: The cavity size was mildly dilated. Wall    thickness was normal.   Impressions:   - Previous echo 2015 - aortic root 17mm. No significant change.   EKG:  EKG is  ordered today.  The ekg ordered today demonstrates sinus rhythm 70 with PVC, no ischemic rhythm mild first-degree AV block 220 ms  Recent Labs: 08/12/2020: Hemoglobin 13.4; Platelets 267.0 03/03/2021: BUN 19; Creatinine, Ser 1.08; Potassium 4.6; Sodium 141 04/14/2021: TSH 1.00  Recent Lipid Panel    Component Value Date/Time   CHOL 122 04/08/2020 0900   TRIG 82.0 04/08/2020 0900   HDL 35.10 (L) 04/08/2020 0900   CHOLHDL 3 04/08/2020 0900   VLDL 16.4 04/08/2020 0900   LDLCALC 70 04/08/2020 0900       Physical Exam:    VS:  BP 110/74    Pulse 67    Ht 6\' 3"  (1.905 m)    Wt 274 lb (124.3 kg)    SpO2 97%    BMI 34.25 kg/m     Wt Readings from Last 3 Encounters:  04/21/21 274 lb (124.3 kg)  04/14/21 270 lb 8 oz (122.7  kg)  03/03/21 280 lb 9.6 oz (127.3 kg)     Affect appropriate Healthy:  appears stated age HEENT: normal Neck supple with no  adenopathy JVP normal no bruits no thyromegaly Lungs clear with no wheezing and good diaphragmatic motion Heart:  S1/S2 no murmur, no rub, gallop or click PMI normal Abdomen: benighn, BS positve, no tenderness, no AAA no bruit.  No HSM or HJR Distal pulses intact with no bruits No edema Neuro non-focal Skin warm and dry No muscular weakness   ASSESSMENT:    No diagnosis found.  PLAN:    Chest Pain:  non cardiac cardiac CTA 03/20/21 CAD RADS 1  HLD:  changed to crestor given high calcium score LDL 70 Aortic Aneurysm:  4.3 cm on CTA 03/20/21 observe  HTN:  Well controlled.  Continue current medications and low sodium Dash type diet.   DM:  Discussed low carb diet.  Target hemoglobin A1c is 6.5 or less.  Continue current medications.  F/U in a year   Jenkins Rouge MD [facility]

## 2021-04-14 NOTE — Progress Notes (Signed)
Subjective:    Patient ID: Anthony [name], male    DOB: 06/07/1955, 66 y.o.   MRN: [id number]  DOS:  04/14/2021 Type of visit - description: CPX  Here for CPX We also discussed his chronic medical problems. In general doing well, + weight loss. Had loose stool for the last 3 days without fever, nausea, vomiting or blood in the stools.  Wt Readings from Last 3 Encounters:  04/14/21 270 lb 8 oz (122.7 kg)  03/03/21 280 lb 9.6 oz (127.3 kg)  12/16/20 274 lb 6 oz (124.5 kg)     Review of Systems  Other than above, a 14 point review of systems is negative      Past Medical History:  Diagnosis Date   Aortic root enlargement (HCC)    CT 42 mm 2014   Chronic back pain    in 2022 pt states "it ended [place] ago"   Diabetes mellitus    type II   DJD (degenerative joint disease)    per MRI of LS spine 2005   GERD (gastroesophageal reflux disease)    Hyperlipidemia    Hypertension    Hypogonadism male    dx w/ hypogonadism elsewhere ~ 2011   Normal cardiac stress test    (-) stress test 2003, 2006   S/P cardiac cath     04-2006 neg per pt--CP improved w/ PPIs   Sleep apnea    uses cpap   Tachycardia    ER 07-2012, ?SCR, on atenolol    Past Surgical History:  Procedure Laterality Date   CARDIAC CATHETERIZATION     COLONOSCOPY     CYSTECTOMY     from back   KNEE ARTHROSCOPY WITH MENISCAL REPAIR Right 2021   POLYPECTOMY      Social History   Socioeconomic History   Marital status: Married    Spouse name: Not on file   Number of children: 2   Years of education: Not on file   Highest education level: Not on file  Occupational History   Occupation: TRUCK DRIVER  Tobacco Use   Smoking status: Former    Types: Cigars    Quit date: 03/12/1981    Years since quitting: 40.1   Smokeless tobacco: Never  Vaping Use   Vaping Use: Never used  Substance and Sexual Activity   Alcohol use: Yes    Alcohol/week: 1.0 standard drink    Types: 1 Cans of beer per week    Comment:  occasionally less than weekly   Drug use: No   Sexual activity: Not on file  Other Topics Concern   Not on file  Social History Narrative   Truck driver (local); lives w/ wife   Social Determinants of Health   Financial Resource Strain: Not on file  Food Insecurity: Not on file  Transportation Needs: Not on file  Physical Activity: Not on file  Stress: Not on file  Social Connections: Not on file  Intimate Partner Violence: Not on file     Current Outpatient Medications  Medication Instructions   aspirin 81 mg, Oral, Daily,     atenolol (TENORMIN) 50 MG tablet TAKE 1/2 TABLET BY MOUTH EVERY DAY   glucose blood (ONETOUCH ULTRA) test strip CHECK BLOOD SUGAR NO MORE THAN TWICE DAILY. DX IS E11.9. MAX ON INSURANCE   hydrochlorothiazide (HYDRODIURIL) 25 mg, Oral, Daily   losartan (COZAAR) 25 MG tablet TAKE 1 TABLET BY MOUTH EVERY DAY   metFORMIN (GLUCOPHAGE) 850 MG tablet TAKE 2  TABLETS BY MOUTH DAILY WITH BREAKFAST AND 1 TABLET DAILY WITH SUPPER.   metoprolol tartrate (LOPRESSOR) 100 mg, Oral,  Once, Take one tablet 2 hrs before your CT scan   pioglitazone (ACTOS) 30 MG tablet TAKE 1 TABLET BY MOUTH EVERY DAY   rosuvastatin (CRESTOR) 20 mg, Oral, Daily   sitaGLIPtin (JANUVIA) 100 MG tablet TAKE 1 TABLET BY MOUTH EVERY DAY       Objective:   Physical Exam BP 126/70 (BP Location: Left Arm, Patient Position: Sitting, Cuff Size: Normal)    Pulse 69    Temp 97.8 F (36.6 C) (Oral)    Resp 18    Ht 6\' 3"  (1.905 m)    Wt 270 lb 8 oz (122.7 kg)    SpO2 95%    BMI 33.81 kg/m  General: Well developed, NAD, BMI noted Neck: No  thyromegaly  HEENT:  Normocephalic . Face symmetric, atraumatic Lungs:  CTA B Normal respiratory effort, no intercostal retractions, no accessory muscle use. Heart: RRR,  no murmur.  Abdomen:  Not distended, soft, non-tender. No rebound or rigidity.   Lower extremities: no pretibial edema bilaterally DRE: Normal sphincter tone, no stools, prostate  normal Skin: Exposed areas without rash. Not pale. Not jaundice Neurologic:  alert & oriented X3.  Speech normal, gait appropriate for age and unassisted Strength symmetric and appropriate for age.  Psych: Cognition and judgment appear intact.  Cooperative with normal attention span and concentration.  Behavior appropriate. No anxious or depressed appearing.     Assessment    Assessment  DM HTN Hyperlipidemia CV Dr [name], next visit 08-2016 --Tachycardia : ER eval 2014,   rx atenolol --stress test 2003, 2006; Chest pain-2008,  (-) cardiac cath, sx decrease with PPIs ---Aorta enlargment per CT  2014, saw surgery 12-2016, they will cont observation -Calcium coronary score 169, 03/20/2021, changed Pravachol to  Crestor OSA , severe - on Cpap GERD DJD, Chronic back pain, back MRI 2005 >>  OA Hypogonadism DX 2011, 2013:  FSH, LH and prolactin, declined Endo referral + FH Prostate ca brother age 96    PLAN   Here for CPX DM: Doing well with diet, lost 10 pounds, ambulatory CBGs fasting typically 140, 150.  Continue metformin, Actos, Januvia.  Check A1c HTN: Ambulatory BP readings range from the 117/ 70 to138/90.  Most readings within normal.  Continue atenolol, HCTZ, losartan, metoprolol.  Last BMP okay. Cardiovascular: Switched from pravastatin to Crestor due to recent calcium coronary score (score was 169).  Check AST, ALT FLP in 6 weeks. OSA: Good CPAP compliance RTC 6 months   This visit occurred during the SARS-CoV-2 public health emergency.  Safety protocols were in place, including screening questions prior to the visit, additional usage of staff PPE, and extensive cleaning of exam room while observing appropriate contact time as indicated for disinfecting solutions.

## 2021-04-14 NOTE — Patient Instructions (Signed)
You are doing great with your diet.  Proceed with a COVID booster  Check the  blood pressure regularly BP GOAL is between 110/65 and  135/85. If it is consistently higher or lower, let me know  Diabetes: Check your blood sugar at different times  - early in AM fasting  ( goal 70-130) - 2 hours after a meal (goal less than 180 - bedtime (goal 90-150)     GO TO THE LAB : Get the blood work     [place], [place] back for   blood work again in 6 weeks fasting:  -Come back for a checkup in 6 months    "Living will", "[place] of attorney": Advanced care planning  (If you already have a living will or healthcare power of attorney, please bring the copy to be scanned in your chart.)  Advance care planning is a process that supports adults in  understanding and sharing their preferences regarding future medical care.   The patient's preferences are recorded in documents called Advance Directives.    Advanced directives are completed (and can be modified at any time) while the patient is in full mental capacity.   The documentation should be available at all times to the patient, the family and the healthcare providers.  Bring in a copy to be scanned in your chart is an excellent idea and is recommended   This legal documents direct treatment decision making and/or appoint a surrogate to make the decision if the patient is not capable to do so.    Advance directives can be documented in many types of formats,  documents have names such as:  Lliving will  Durable power of attorney for healthcare (healthcare proxy or healthcare power of attorney)  Combined directives  Physician orders for life-sustaining treatment    More information at:  [email or web address]

## 2021-04-14 NOTE — Assessment & Plan Note (Signed)
Here for CPX DM: Doing well with diet, lost 10 pounds, ambulatory CBGs fasting typically 140, 150.  Continue metformin, Actos, Januvia.  Check A1c HTN: Ambulatory BP readings range from the 117/ 70 to138/90.  Most readings within normal.  Continue atenolol, HCTZ, losartan, metoprolol.  Last BMP okay. Cardiovascular: Switched from pravastatin to Crestor due to recent calcium coronary score (score was 169).  Check AST, ALT FLP in 6 weeks. OSA: Good CPAP compliance RTC 6 months

## 2021-04-21 ENCOUNTER — Encounter: Payer: Self-pay | Admitting: Cardiovascular Disease

## 2021-04-21 ENCOUNTER — Other Ambulatory Visit: Payer: Self-pay

## 2021-04-21 ENCOUNTER — Ambulatory Visit: Payer: 59 | Admitting: Cardiovascular Disease

## 2021-04-21 VITALS — BP 110/74 | HR 67 | Ht 75.0 in | Wt 274.0 lb

## 2021-04-21 DIAGNOSIS — I7121 Aneurysm of the ascending aorta, without rupture: Secondary | ICD-10-CM

## 2021-04-21 DIAGNOSIS — I10 Essential (primary) hypertension: Secondary | ICD-10-CM

## 2021-04-21 NOTE — Patient Instructions (Addendum)
Medication Instructions:  Your physician recommends that you continue on your current medications as directed. Please refer to the Current Medication list given to you today.  *If you need a refill on your cardiac medications before your next appointment, please call your pharmacy*  Lab Work: If you have labs (blood work) drawn today and your tests are completely normal, you will receive your results only by: [place] (if you have MyChart) OR A paper copy in the mail If you have any lab test that is abnormal or we need to change your treatment, we will call you to review the results.  Testing/Procedures: None ordered today.  Follow-Up: At [facility], you and your health needs are our priority.  As part of our continuing mission to provide you with exceptional heart care, we have created designated Provider Care Teams.  These Care Teams include your primary Cardiologist (physician) and Advanced Practice Providers (APPs -  Physician Assistants and Nurse Practitioners) who all work together to provide you with the care you need, when you need it.  We recommend signing up for the patient portal called "MyChart".  Sign up information is provided on this After Visit Summary.  MyChart is used to connect with patients for Virtual Visits (Telemedicine).  Patients are able to view lab/test results, encounter notes, upcoming appointments, etc.  Non-urgent messages can be sent to your provider as well.   To learn more about what you can do with MyChart, go to [email or web address].    Your next appointment:   12 month(s)  The format for your next appointment:   In Person  Provider:   Jenkins Rouge, MD {  Please give our office a call and let us know if you are taking metoprolol or atenolol

## 2021-04-21 NOTE — Addendum Note (Signed)
Addended by: Aris Georgia, Fayez Sturgell L on: 04/21/2021 08:32 AM   Modules accepted: Orders

## 2021-05-09 ENCOUNTER — Other Ambulatory Visit: Payer: Self-pay | Admitting: Internal Medicine

## 2021-05-26 ENCOUNTER — Other Ambulatory Visit (INDEPENDENT_AMBULATORY_CARE_PROVIDER_SITE_OTHER): Payer: 59

## 2021-05-26 DIAGNOSIS — E785 Hyperlipidemia, unspecified: Secondary | ICD-10-CM

## 2021-05-26 LAB — LIPID PANEL
Cholesterol: 117 mg/dL (ref 0–200)
HDL: 41.6 mg/dL (ref >39.00)
LDL Cholesterol: 55 mg/dL (ref 0–99)
NonHDL: 75.11
Total CHOL/HDL Ratio: 3
Triglycerides: 103 mg/dL (ref 0.0–149.0)
VLDL: 20.6 mg/dL (ref 0.0–40.0)

## 2021-05-26 LAB — AST: AST: 15 U/L (ref 0–37)

## 2021-05-26 LAB — ALT: ALT: 21 U/L (ref 0–53)

## 2021-06-08 ENCOUNTER — Other Ambulatory Visit: Payer: Self-pay | Admitting: Internal Medicine

## 2021-06-08 NOTE — Progress Notes (Signed)
? ?I, [name], LAT, ATC, am serving as scribe for Dr. Lynne Leader. ? ?Anthony [name] is a 66 y.o. male who presents to [place] at [facility] today for L knee pain.  He was last seen by Dr. Georgina Snell on 11/09/20 for low back pain and prior to that for his R knee in 2021.Today, pt reports L knee pain x 3 days. Pt recalls "twisting" his L knee while at work. Since the injury, pt will experience shooting pain down the anterior aspect of his lower leg. He locates his pain to the posterior aspect of the L knee and deep within the joint .   ? ?Additionally he notes some right hand pain at the third digit DIP and some mild left hand pain at the first [facility].  These pains have been ongoing for years and he would like to ask about them while he is here today primarily for his left leg. ? ?Radiating: yes- distally, anterior lower leg ?L knee swelling: no ?L knee mechanical symptoms: yes ?Aggravating factors: nothing in particular ?Treatments tried: IBU ? ? ?Pertinent review of systems: No fevers or chills ? ?Relevant historical information: Hypertension and diabetes. ? ? ?Exam:  ?BP 112/78   Pulse 68   Ht '6\' 3"'$  (1.905 m)   Wt 277 lb 6.4 oz (125.8 kg)   SpO2 96%   BMI 34.67 kg/m?  ?General: Well Developed, well nourished, and in no acute distress.  ? ?MSK: L-spine nontender midline normal lumbar motion.  Lower EXTR strength is intact. ?Positive left-sided slump test. ? ?Left knee normal-appearing ?Nontender. ?Normal motion with crepitation. ?Stable ligamentous exam ?Intact strength.  Resisted strength test does not produce pain. ? ?Right hand slight swelling at third DIP.  Minimally tender to palpation normal motion. ? ? ? ?Lab and Radiology Results ? ?X-ray images right hand and left knee obtained today personally and independently interpreted. ? ?Right hand: Possible mild erosion at third MCP.  Old healed fracture with mild volar angulation at fifth metacarpal head.  Question old injury to the distal  ulna.  ? ?Left knee: Mild medial compartment DJD.  No acute fractures. ? ?Await formal radiology review ? ?EXAM: ?LUMBAR SPINE - 2-3 VIEW ?  ?COMPARISON:  Chest CT 11/23/2016 ?  ?FINDINGS: ?Lumbar alignment within normal limits. Mild wedging of T12 vertebra ?is chronic. Moderate disc space narrowing and degenerative change at ?L5-S1. Remaining disc spaces are grossly patent. There are ?degenerative small osteophytes at multiple levels. ?  ?IMPRESSION: ?Degenerative changes, most advanced at L5-S1 ?  ?  ?Electronically Signed ?  By: Donavan Foil M.D. ?  On: 11/12/2020 19:03 ? I, Lynne Leader, personally (independently) visualized and performed the interpretation of the images attached in this note. ? ? ? ?Assessment and Plan: ?66 y.o. male with left leg pain.  Pain is primarily along the L5 dermatomal pattern and I think mostly related to lumbar radiculopathy.  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 ?His pain is spontaneously improving so watchful waiting is the primary treatment for now but will prescribe prednisone and gabapentin as a backup plan for right now.  If needed in the future PT or even an MRI. ? ?We will obtain x-ray of the knee to evaluate for that as knee pain due to DJD is certainly a possibility but less likely to be the dominant feature. ? ?He does have hand pain bilaterally right DIP the dominant issue.  Pain is  thought to be due to DJD.  Plan for x-ray today and home exercise program taught in clinic today by myself.  If not improving he will let me know and I can refer to hand therapy.  This should be quite helpful.  Also recommend Voltaren gel. ? ? ?PDMP not reviewed this encounter. ?Orders Placed This Encounter  ?Procedures  ? DG Knee AP/LAT W/[place] Left  ?  Standing Status:   Future  ?  Number of Occurrences:   1  ?  Standing Expiration Date:   07/09/2021  ?  Order Specific Question:   Reason for Exam (SYMPTOM  OR DIAGNOSIS REQUIRED)  ?  Answer:   Left knee  pain  ?  Order Specific Question:   Preferred imaging location?  ?  Answer:   [facility]  ? DG Hand Complete Right  ?  Standing Status:   Future  ?  Number of Occurrences:   1  ?  Standing Expiration Date:   07/09/2021  ?  Order Specific Question:   Reason for Exam (SYMPTOM  OR DIAGNOSIS REQUIRED)  ?  Answer:   right hand pain  ?  Order Specific Question:   Preferred imaging location?  ?  Answer:   [facility]  ? ?Meds ordered this encounter  ?Medications  ? gabapentin (NEURONTIN) 300 MG capsule  ?  Sig: Take 1 capsule (300 mg total) by mouth 3 (three) times daily.  ?  Dispense:  30 capsule  ?  Refill:  2  ? predniSONE (DELTASONE) 50 MG tablet  ?  Sig: Take 1 pill daily for 5 days  ?  Dispense:  5 tablet  ?  Refill:  0  ? ? ? ?Discussed warning signs or symptoms. Please see discharge instructions. Patient expresses understanding. ? ? ?The above documentation has been reviewed and is accurate and complete Lynne Leader, M.D. ? ? ? ?

## 2021-06-09 ENCOUNTER — Ambulatory Visit (INDEPENDENT_AMBULATORY_CARE_PROVIDER_SITE_OTHER): Payer: 59

## 2021-06-09 ENCOUNTER — Ambulatory Visit: Payer: Self-pay

## 2021-06-09 ENCOUNTER — Ambulatory Visit: Payer: 59 | Admitting: Family Medicine

## 2021-06-09 VITALS — BP 112/78 | HR 68 | Ht 75.0 in | Wt 277.4 lb

## 2021-06-09 DIAGNOSIS — M79641 Pain in right hand: Secondary | ICD-10-CM

## 2021-06-09 DIAGNOSIS — M5416 Radiculopathy, lumbar region: Secondary | ICD-10-CM

## 2021-06-09 DIAGNOSIS — M25562 Pain in left knee: Secondary | ICD-10-CM | POA: Diagnosis not present

## 2021-06-09 MED ORDER — PREDNISONE 50 MG PO TABS
ORAL_TABLET | ORAL | 0 refills | Status: DC
Start: 2021-06-09 — End: 2021-09-01

## 2021-06-09 MED ORDER — GABAPENTIN 300 MG PO CAPS: 300.0000 mg | ORAL_CAPSULE | Freq: Three times a day (TID) | ORAL | 2 refills | Status: DC

## 2021-06-09 NOTE — Patient Instructions (Addendum)
Thank you for coming in today.  ? ?Please get an Xray today before you leave  ? ?Modeling clay ? ?Please use Voltaren gel (Generic Diclofenac Gel) up to 4x daily for pain as needed.  This is available over-the-counter as both the name brand Voltaren gel and the generic diclofenac gel.  ? ?I've sent a prescription for Gabapentin and Prednisone to your pharmacy.  ? ?Recheck back as needed ? ?

## 2021-06-12 NOTE — Progress Notes (Signed)
Right hand x-ray shows mild arthritis changes.  No fractures are seen.

## 2021-06-12 NOTE — Progress Notes (Signed)
Left knee x-ray looks normal to radiology

## 2021-07-08 ENCOUNTER — Other Ambulatory Visit: Payer: Self-pay | Admitting: Internal Medicine

## 2021-07-19 ENCOUNTER — Other Ambulatory Visit: Payer: Self-pay | Admitting: Thoracic Surgery (Cardiothoracic Vascular Surgery)

## 2021-07-19 DIAGNOSIS — I7789 Other specified disorders of arteries and arterioles: Secondary | ICD-10-CM

## 2021-08-04 ENCOUNTER — Other Ambulatory Visit (HOSPITAL_COMMUNITY): Payer: 59

## 2021-08-04 ENCOUNTER — Ambulatory Visit (HOSPITAL_COMMUNITY): Payer: 59

## 2021-08-04 ENCOUNTER — Ambulatory Visit (HOSPITAL_COMMUNITY)
Admission: RE | Admit: 2021-08-04 | Discharge: 2021-08-04 | Disposition: A | Discharge status: Home / Self Care | Payer: 59 | Source: Ambulatory Visit | Attending: Thoracic Surgery (Cardiothoracic Vascular Surgery) | Admitting: Thoracic Surgery (Cardiothoracic Vascular Surgery)

## 2021-08-04 DIAGNOSIS — I7789 Other specified disorders of arteries and arterioles: Secondary | ICD-10-CM | POA: Diagnosis present

## 2021-08-04 LAB — POCT I-STAT CREATININE: Creatinine, Ser: 1 mg/dL (ref 0.61–1.24)

## 2021-08-04 MED ORDER — IOHEXOL 350 MG/ML SOLN
100.0000 mL | Freq: Once | INTRAVENOUS | Status: AC | PRN
  Administered 2021-08-04: 100 mL via INTRAVENOUS

## 2021-08-22 ENCOUNTER — Ambulatory Visit: Payer: 59 | Admitting: Thoracic Surgery (Cardiothoracic Vascular Surgery)

## 2021-08-29 ENCOUNTER — Ambulatory Visit: Payer: 59 | Admitting: Thoracic Surgery (Cardiothoracic Vascular Surgery)

## 2021-08-29 VITALS — BP 124/75 | HR 83 | Resp 20 | Ht 75.0 in | Wt 284.0 lb

## 2021-08-29 DIAGNOSIS — I77810 Thoracic aortic ectasia: Secondary | ICD-10-CM | POA: Diagnosis not present

## 2021-08-29 DIAGNOSIS — I7789 Other specified disorders of arteries and arterioles: Secondary | ICD-10-CM | POA: Diagnosis not present

## 2021-08-29 MED ORDER — HYDROCOD POLI-CHLORPHE POLI ER 10-8 MG/5ML PO SUER: 5.0000 mL | Freq: Two times a day (BID) | ORAL | 0 refills | Status: DC | PRN

## 2021-08-29 NOTE — Progress Notes (Signed)
[address]       [place] [address]             [phone]    HPI: 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  He was first noted to have a dilated aortic root on echocardiogram in 2014.  He continues to work as a Administrator.  He is not having any chest pain, pressure, tightness, or shortness of breath.  He has had a persistent cough.  It is interfering with his ability to sleep at night.  He recently completed a course of antibiotics.  He feels better overall but his cough persists.  He does monitor his blood pressure and has been well controlled.  Past Medical History:  Diagnosis Date   Aortic root enlargement ([facility])    CT 42 mm 2014   Chronic back pain    in 2022 pt states "it ended [place] ago"   Diabetes mellitus    type II   DJD (degenerative joint disease)    per MRI of LS spine 2005   GERD (gastroesophageal reflux disease)    Hyperlipidemia    Hypertension    Hypogonadism male    dx w/ hypogonadism elsewhere ~ 2011   Normal cardiac stress test    (-) stress test 2003, 2006   S/P cardiac cath     04-2006 neg per pt--CP improved w/ PPIs   Sleep apnea    uses cpap   Tachycardia    ER 07-2012, ?SCR, on atenolol    Current Outpatient Medications  Medication Sig Dispense Refill   aspirin 81 MG tablet Take 81 mg by mouth daily.     atenolol (TENORMIN) 50 MG tablet TAKE 1/2 TABLET BY MOUTH EVERY DAY 45 tablet 3   glucose blood (ONETOUCH ULTRA) test strip CHECK BLOOD SUGAR NO MORE THAN TWICE DAILY. DX IS E11.9. MAX ON INSURANCE 100 strip 12   hydrochlorothiazide (HYDRODIURIL) 25 MG tablet TAKE 1 TABLET (25 MG TOTAL) BY MOUTH DAILY. 90 tablet 1   losartan (COZAAR) 25 MG tablet TAKE 1 TABLET BY MOUTH EVERY DAY 90 tablet 1   metFORMIN (GLUCOPHAGE) 850 MG tablet TAKE 2 TABLETS BY  MOUTH DAILY WITH BREAKFAST AND 1 TABLET DAILY WITH SUPPER. 90 tablet 2   pioglitazone (ACTOS) 30 MG tablet TAKE 1 TABLET BY MOUTH EVERY DAY 90 tablet 0   rosuvastatin (CRESTOR) 20 MG tablet Take 1 tablet (20 mg total) by mouth daily. 90 tablet 3   sitaGLIPtin (JANUVIA) 100 MG tablet TAKE 1 TABLET BY MOUTH EVERY DAY 90 tablet 1   gabapentin (NEURONTIN) 300 MG capsule Take 1 capsule (300 mg total) by mouth 3 (three) times daily. (Patient not taking: Reported on 08/29/2021) 30 capsule 2   predniSONE (DELTASONE) 50 MG tablet Take 1 pill daily for 5 days (Patient not taking: Reported on 08/29/2021) 5 tablet 0   No current facility-administered medications for this visit.    Physical Exam BP 124/75   Pulse 83   Resp 20   Ht '6\' 3"'$  (1.905 m)   Wt 284 lb (128.8 kg)   SpO2 97% Comment: RA  BMI 35.78 kg/m  65 year old man in no acute distress Overweight Alert and oriented x3 with no focal deficits No carotid bruits Cardiac regular rate and rhythm with no murmur  Lungs clear bilaterally   Diagnostic Tests: CT ANGIOGRAPHY CHEST WITH CONTRAST   TECHNIQUE: Multidetector CT imaging of the chest was performed using the standard protocol during bolus administration of intravenous contrast. Multiplanar CT image reconstructions and MIPs were obtained to evaluate the vascular anatomy.   RADIATION DOSE REDUCTION: This exam was performed according to the departmental dose-optimization program which includes automated exposure control, adjustment of the mA and/or kV according to patient size and/or use of iterative reconstruction technique.   CONTRAST:  150m OMNIPAQUE IOHEXOL 350 MG/ML SOLN   COMPARISON:  03/20/2021, 11/23/2016   FINDINGS: Cardiovascular:   Heart:   Cardiomegaly. No pericardial fluid/thickening. Calcifications of the left anterior descending and right coronary artery   Aorta:   No significant aortic valve calcifications.   Greatest estimated diameter of the aortic  annulus 40 mm on the coronal reformatted images.   Greatest estimated diameter of the sinuses of Valsalva on the coronal reformatted images estimated 49 mm.   Greatest estimated diameter of the sino-tubular junction, 29 mm on the coronal images.   Greatest estimated diameter of the ascending aorta on the axial images, 33 mm.   No significant atherosclerotic changes of the aorta. Branch vessels are patent with a 3 vessel arch. Cervical cerebral vessels patent at the base of the neck.   No pedunculated plaque, ulcerated plaque, dissection, periaortic fluid. No wall thickening.   Pulmonary arteries:   Timing of the contrast bolus is not optimized for evaluation of pulmonary artery filling defects. Unremarkable size of the main pulmonary artery.   Mediastinum/Nodes: No mediastinal adenopathy. Unremarkable appearance of the thoracic esophagus.   Unremarkable appearance of the thoracic inlet.   Lungs/Pleura: Central airways are clear. No pleural effusion. No confluent airspace disease.   No pneumothorax.   Upper Abdomen: No acute finding of the upper abdomen.   Musculoskeletal: No acute displaced fracture. Degenerative changes of the spine.   Review of the MIP images confirms the above findings.   IMPRESSION: CT angiogram negative for ascending aortic aneurysm.   Sinus of Valsalva aneurysm, estimated 4.9 cm. The estimated diameter of the annulus on the coronal reformatted images is 4.0 cm. Correlation with ECHO may be useful.   Signed,   JDulcy Fanny WDellia Nims RPVI   [facility]   [facility]     Electronically Signed   By: JCorrie MckusickD.O.   On: 08/04/2021 16:54 I personally reviewed the CT images. There is a 4.9 cm sinus of Valsalva aneurysm.  Impression: Anthony [name] a 66year old man with a history of hypertension, hyperlipidemia, type 2 diabetes, reflux, sleep apnea, and a sinus of Valsalva aneurysm.  He was  first noted to have a dilated aortic root on echocardiogram in 2014.  Sinus of Valsalva aneurysm-measures 4.9 cm on cardiac gated CT.  Measurements have been variable on past scans due to variable quality of the scans.  Very good quality scan in 2016 showed at 4.3 cm.  No indication for surgery at present but is definitely increased in size over time.  I think we need to scan him again in 6 months.  We will plan to do a cardiac gated CT.  Hypertension-blood pressure well controlled on current regimen  Persistent cough-recently completed a course of antibiotics.  Feels well but cough does not resolve.  Was not able to wear his CPAP last night as it aggravated his cough.  Has been having trouble sleeping.  Tessalon was ineffective.  I gave him a  prescription for Tussionex  Plan: Prescription for Tussionex 5 mL p.o. twice daily as needed for cough, 60 mL, no refills Return in 6 months with cardiac gated CT to follow-up sinus of Valsalva aneurysm.  I spent over 20 minutes in review of records, images, and in consultation with Mr. Rhinesmith today. Melrose Nakayama, MD [facility] [phone]

## 2021-09-01 ENCOUNTER — Encounter: Payer: Self-pay | Admitting: Internal Medicine

## 2021-09-01 ENCOUNTER — Ambulatory Visit: Payer: 59 | Admitting: Internal Medicine

## 2021-09-01 VITALS — BP 118/74 | HR 53 | Temp 98.0°F | Resp 18 | Ht 75.0 in | Wt 279.0 lb

## 2021-09-01 DIAGNOSIS — R052 Subacute cough: Secondary | ICD-10-CM

## 2021-09-01 MED ORDER — AZITHROMYCIN 250 MG PO TABS: ORAL_TABLET | ORAL | 0 refills | Status: DC

## 2021-09-01 MED ORDER — BENZONATATE 200 MG PO CAPS: 200.0000 mg | ORAL_CAPSULE | Freq: Three times a day (TID) | ORAL | 0 refills | Status: DC | PRN

## 2021-09-02 ENCOUNTER — Other Ambulatory Visit: Payer: Self-pay | Admitting: Internal Medicine

## 2021-09-03 ENCOUNTER — Other Ambulatory Visit: Payer: Self-pay | Admitting: Internal Medicine

## 2021-10-13 ENCOUNTER — Ambulatory Visit: Payer: 59 | Admitting: Internal Medicine

## 2021-10-13 ENCOUNTER — Encounter: Payer: Self-pay | Admitting: Internal Medicine

## 2021-10-13 VITALS — BP 128/74 | HR 48 | Temp 98.3°F | Resp 16 | Ht 75.0 in | Wt 276.2 lb

## 2021-10-13 DIAGNOSIS — E1159 Type 2 diabetes mellitus with other circulatory complications: Secondary | ICD-10-CM

## 2021-10-13 DIAGNOSIS — I10 Essential (primary) hypertension: Secondary | ICD-10-CM

## 2021-10-13 DIAGNOSIS — E119 Type 2 diabetes mellitus without complications: Secondary | ICD-10-CM | POA: Diagnosis not present

## 2021-10-13 LAB — CBC WITH DIFFERENTIAL/PLATELET
Basophils Absolute: 0.1 10*3/uL (ref 0.0–0.1)
Basophils Relative: 0.6 % (ref 0.0–3.0)
Eosinophils Absolute: 0.1 10*3/uL (ref 0.0–0.7)
Eosinophils Relative: 1.4 % (ref 0.0–5.0)
HCT: 39.3 % (ref 39.0–52.0)
Hemoglobin: 13.6 g/dL (ref 13.0–17.0)
Lymphocytes Relative: 13.8 % (ref 12.0–46.0)
Lymphs Abs: 1.2 10*3/uL (ref 0.7–4.0)
MCHC: 34.5 g/dL (ref 30.0–36.0)
MCV: 91.4 fl (ref 78.0–100.0)
Monocytes Absolute: 0.7 10*3/uL (ref 0.1–1.0)
Monocytes Relative: 8.1 % (ref 3.0–12.0)
Neutro Abs: 6.4 10*3/uL (ref 1.4–7.7)
Neutrophils Relative %: 76.1 % (ref 43.0–77.0)
Platelets: 244 10*3/uL (ref 150.0–400.0)
RBC: 4.3 Mil/uL (ref 4.22–5.81)
RDW: 12.7 % (ref 11.5–15.5)
WBC: 8.4 10*3/uL (ref 4.0–10.5)

## 2021-10-13 LAB — BASIC METABOLIC PANEL
BUN: 21 mg/dL (ref 6–23)
CO2: 29 mEq/L (ref 19–32)
Calcium: 9.2 mg/dL (ref 8.4–10.5)
Chloride: 102 mEq/L (ref 96–112)
Creatinine, Ser: 0.89 mg/dL (ref 0.40–1.50)
GFR: 90.83 mL/min (ref >60.00)
Glucose, Bld: 154 mg/dL — ABNORMAL HIGH (ref 70–99)
Potassium: 4.1 mEq/L (ref 3.5–5.1)
Sodium: 138 mEq/L (ref 135–145)

## 2021-10-13 LAB — HEMOGLOBIN A1C: Hgb A1c MFr Bld: 8.6 % — ABNORMAL HIGH (ref 4.6–6.5)

## 2021-10-13 NOTE — Progress Notes (Signed)
Subjective:    Patient ID: Anthony [name], male    DOB: 1955/05/28, 66 y.o.   MRN: [id number]  DOS:  10/13/2021 Type of visit - description: rov  Since the last office visit is doing okay. Today we discussed his chronic medical problems. Reports ambulatory CBGs are slightly higher lately. Denies chest pain or difficulty breathing.  No paresthesia  Wt Readings from Last 3 Encounters:  10/13/21 276 lb 4 oz (125.3 kg)  09/01/21 279 lb (126.6 kg)  08/29/21 284 lb (128.8 kg)    Review of Systems See above   Past Medical History:  Diagnosis Date   Aortic root enlargement ([facility])    CT 42 mm 2014   Chronic back pain    in 2022 pt states "it ended [place] ago"   Diabetes mellitus    type II   DJD (degenerative joint disease)    per MRI of LS spine 2005   GERD (gastroesophageal reflux disease)    Hyperlipidemia    Hypertension    Hypogonadism male    dx w/ hypogonadism elsewhere ~ 2011   Normal cardiac stress test    (-) stress test 2003, 2006   S/P cardiac cath     04-2006 neg per pt--CP improved w/ PPIs   Sleep apnea    uses cpap   Tachycardia    ER 07-2012, ?SCR, on atenolol    Past Surgical History:  Procedure Laterality Date   CARDIAC CATHETERIZATION     COLONOSCOPY     CYSTECTOMY     from back   KNEE ARTHROSCOPY WITH MENISCAL REPAIR Right 2021   POLYPECTOMY      Current Outpatient Medications  Medication Instructions   aspirin 81 mg, Oral, Daily,     atenolol (TENORMIN) 50 MG tablet TAKE 1/2 TABLET BY MOUTH EVERY DAY   glucose blood (ONETOUCH ULTRA) test strip CHECK BLOOD SUGAR NO MORE THAN TWICE DAILY. DX IS E11.9. MAX ON INSURANCE   hydrochlorothiazide (HYDRODIURIL) 25 mg, Oral, Daily   JANUVIA 100 MG tablet TAKE 1 TABLET BY MOUTH EVERY DAY   losartan (COZAAR) 25 MG tablet TAKE 1 TABLET BY MOUTH EVERY DAY   metFORMIN (GLUCOPHAGE) 850 MG tablet TAKE 2 TABLETS BY MOUTH DAILY WITH BREAKFAST AND 1 TABLET DAILY WITH SUPPER.   pioglitazone (ACTOS) 30 MG tablet TAKE 1  TABLET BY MOUTH EVERY DAY   rosuvastatin (CRESTOR) 20 mg, Oral, Daily       Objective:   Physical Exam BP 128/74   Pulse (!) 48   Temp 98.3 F (36.8 C) (Oral)   Resp 16   Ht '6\' 3"'$  (1.905 m)   Wt 276 lb 4 oz (125.3 kg)   SpO2 97%   BMI 34.53 kg/m  General:   Well developed, NAD, BMI noted. HEENT:  Normocephalic . Face symmetric, atraumatic DM foot exam: No edema, good pedal pulses, pinprick examination normal Skin: Not pale. Not jaundice Neurologic:  alert & oriented X3.  Speech normal, gait appropriate for age and unassisted Psych--  Cognition and judgment appear intact.  Cooperative with normal attention span and concentration.  Behavior appropriate. No anxious or depressed appearing.      Assessment   Assessment  DM HTN Hyperlipidemia CV Dr [name], next visit 08-2016 --Tachycardia : ER eval 2014,   rx atenolol --stress test 2003, 2006; Chest pain-2008,  (-) cardiac cath, sx decrease with PPIs ---Aorta enlargment per CT  2014, saw surgery 12-2016, they will cont observation -Calcium coronary score 169, 03/20/2021,  changed Pravachol to  Crestor OSA , severe - on Cpap GERD DJD, Chronic back pain, back MRI 2005 >>  OA Hypogonadism DX 2011, 2013:  FSH, LH and prolactin, declined Endo referral + FH Prostate ca brother age 76    PLAN  DM: Currently on metformin, Actos, Januvia. Some weight loss noted.  He still is trying to eat healthy. Feet exam negative. Ambulatory CBGs lately has been slightly higher than before (in the mornings) ~ the 190s.  In the afternoon remaining stable at 140. We will check A1c.  Further advise w/ results> Invokana (before cost was an issue)?, switch Januvia to Victoza (the patient will be receptive)?Marland Kitchen In the past Rybelsus caused some side effects.  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

## 2021-10-13 NOTE — Patient Instructions (Addendum)
Recommend to proceed with covid booster (bivalent) at your pharmacy.  Flu shot this fall.   Continue checking your blood sugars.    GO TO THE LAB : Get the blood work     [place], [place] back for a checkup in 4 months

## 2021-10-15 NOTE — Assessment & Plan Note (Signed)
DM: Currently on metformin, Actos, Januvia. Some weight loss noted.  He still is trying to eat healthy. Feet exam negative. Ambulatory CBGs lately has been slightly higher than before (in the mornings) ~ the 190s.  In the afternoon remaining stable at 140. We will check A1c.  Further advise w/ results> Invokana (before cost was an issue)?, switch Januvia to Victoza (the patient will be receptive)?Marland Kitchen In the past Rybelsus caused some side effects.  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

## 2021-10-16 ENCOUNTER — Encounter: Payer: Self-pay | Admitting: Internal Medicine

## 2021-10-17 MED ORDER — VICTOZA 18 MG/3ML ~~LOC~~ SOPN: PEN_INJECTOR | SUBCUTANEOUS | 3 refills | Status: DC

## 2021-10-17 MED ORDER — PEN NEEDLES 32G X 6 MM MISC: 3 refills | Status: DC

## 2021-11-02 ENCOUNTER — Other Ambulatory Visit: Payer: Self-pay | Admitting: Internal Medicine

## 2021-11-20 ENCOUNTER — Other Ambulatory Visit: Payer: Self-pay | Admitting: Internal Medicine

## 2021-12-04 ENCOUNTER — Ambulatory Visit: Payer: 59 | Admitting: Pulmonary Disease

## 2021-12-04 ENCOUNTER — Encounter: Payer: Self-pay | Admitting: Pulmonary Disease

## 2021-12-04 VITALS — BP 116/78 | HR 71 | Temp 98.1°F | Ht 75.0 in | Wt 271.8 lb

## 2021-12-04 DIAGNOSIS — G4733 Obstructive sleep apnea (adult) (pediatric): Secondary | ICD-10-CM | POA: Diagnosis not present

## 2021-12-04 DIAGNOSIS — Z9989 Dependence on other enabling machines and devices: Secondary | ICD-10-CM | POA: Diagnosis not present

## 2021-12-04 NOTE — Progress Notes (Signed)
Anthony [name]    [id number]    21-Nov-1955  Primary Care Physician:Paz, Alda Berthold, MD  Referring Physician: Colon Branch, MD [address] [place],  [place] [address]  Chief complaint:   Patient with a history of obstructive sleep apnea  HPI:  History of severe obstructive sleep apnea Compliant with CPAP use Study was in 2013 showing severe obstructive sleep apnea  Has been on CPAP since then and continues to benefit from it on a regular basis Patient is about 66 years old  Unable to obtain a compliance today  Compliance on his phone does revealed 98% compliance with average AHI of 0.63  Usually gets up daily about 4 AM Takes him a little bit of time to get going  He has hypertension, diabetes-well-controlled  Wakes up in the morning feeling well rested on most days Real mouth dryness in the morning  He follows his CPAP use with an app on his phone on a regular basis  Outpatient Encounter Medications as of 12/04/2021  Medication Sig   aspirin 81 MG tablet Take 81 mg by mouth daily.   atenolol (TENORMIN) 50 MG tablet TAKE 1/2 TABLET BY MOUTH EVERY DAY   glucose blood (ONETOUCH ULTRA) test strip CHECK BLOOD SUGAR NO MORE THAN TWICE DAILY. DX IS E11.9. MAX ON INSURANCE   hydrochlorothiazide (HYDRODIURIL) 25 MG tablet TAKE 1 TABLET (25 MG TOTAL) BY MOUTH DAILY.   Insulin Pen Needle (PEN NEEDLES) 32G X 6 MM MISC To use w/ Victoza   liraglutide (VICTOZA) 18 MG/3ML SOPN Inject 1.2 mg into the skin daily. Increase to 1.'8mg'$  as directed by provider if blood sugars not well controlled   losartan (COZAAR) 25 MG tablet TAKE 1 TABLET BY MOUTH EVERY DAY   metFORMIN (GLUCOPHAGE) 850 MG tablet TAKE 2 TABLETS BY MOUTH DAILY WITH BREAKFAST AND 1 TABLET DAILY WITH SUPPER.   pioglitazone (ACTOS) 30 MG tablet TAKE 1 TABLET BY MOUTH EVERY DAY   rosuvastatin (CRESTOR) 20 MG tablet Take 1 tablet (20 mg total) by mouth daily.   No facility-administered encounter medications on  file as of 12/04/2021.    Allergies as of 12/04/2021   (No Known Allergies)    Past Medical History:  Diagnosis Date   Aortic root enlargement ([facility])    CT 42 mm 2014   Chronic back pain    in 2022 pt states "it ended [place] ago"   Diabetes mellitus    type II   DJD (degenerative joint disease)    per MRI of LS spine 2005   GERD (gastroesophageal reflux disease)    Hyperlipidemia    Hypertension    Hypogonadism male    dx w/ hypogonadism elsewhere ~ 2011   Normal cardiac stress test    (-) stress test 2003, 2006   S/P cardiac cath     04-2006 neg per pt--CP improved w/ PPIs   Sleep apnea    uses cpap   Tachycardia    ER 07-2012, ?SCR, on atenolol    Past Surgical History:  Procedure Laterality Date   CARDIAC CATHETERIZATION     COLONOSCOPY     CYSTECTOMY     from back   KNEE ARTHROSCOPY WITH MENISCAL REPAIR Right 2021   POLYPECTOMY      Family History  Problem Relation Age of Onset   Heart disease Father        CABG at age 21   Colon polyps  Brother    Heart disease Brother        CABG   Sinusitis Brother    Diabetes Other        cousins   Prostate cancer Brother 61   Stomach cancer Paternal Uncle    Bladder Cancer Paternal Uncle    Colon cancer Neg Hx    Rectal cancer Neg Hx    Esophageal cancer Neg Hx     Social History   Socioeconomic History   Marital status: Married    Spouse name: Not on file   Number of children: 2   Years of education: Not on file   Highest education level: Not on file  Occupational History   Occupation: TRUCK DRIVER  Tobacco Use   Smoking status: Former    Types: Cigars    Quit date: 03/12/1981    Years since quitting: 40.7   Smokeless tobacco: Never  Vaping Use   Vaping Use: Never used  Substance and Sexual Activity   Alcohol use: Yes    Alcohol/week: 1.0 standard drink of alcohol    Types: 1 Cans of beer per week    Comment: occasionally less than weekly   Drug use: No   Sexual activity: Not on file  Other Topics  Concern   Not on file  Social History Narrative   Truck driver (local); lives w/ wife   Social Determinants of Health   Financial Resource Strain: Not on file  Food Insecurity: Not on file  Transportation Needs: Not on file  Physical Activity: Not on file  Stress: Not on file  Social Connections: Not on file  Intimate Partner Violence: Not on file    Review of Systems  Constitutional:  Negative for activity change.  Respiratory:  Positive for apnea.     Vitals:   12/04/21 1613  BP: 116/78  Pulse: 71  Temp: 98.1 F (36.7 C)  SpO2: 97%     Physical Exam Constitutional:      Appearance: He is obese.  HENT:     Head: Normocephalic.     Nose: Nose normal.     Mouth/Throat:     Mouth: Mucous membranes are moist.     Comments: Mallampati 3, Eyes:     Pupils: Pupils are equal, round, and reactive to light.  Cardiovascular:     Rate and Rhythm: Normal rate and regular rhythm.     Heart sounds: No murmur heard.    No friction rub.  Pulmonary:     Effort: No respiratory distress.     Breath sounds: No stridor. No wheezing or rhonchi.  Musculoskeletal:     Cervical back: No rigidity or tenderness.  Neurological:     Mental Status: He is alert.  Psychiatric:        Mood and Affect: Mood normal.    Data Reviewed: Compliance data on his phone at shows 98% compliance Residual AHI of 0.63  Assessment:  Severe obstructive sleep apnea -Adequately for CPAP therapy -Continues to benefit from CPAP use  Compliant with CPAP use nightly  Does have some early morning inertia   Plan/Recommendations: Continue using CPAP on a regular basis  Yearly follow-up  Encouraged weight loss efforts    Sherrilyn Rist MD [facility] 12/04/2021, 4:18 PM  CC: Colon Branch, MD

## 2021-12-04 NOTE — Patient Instructions (Signed)
  I will see you back in a year  Continue CPAP use  Machine compliance shows that you are not using it regularly

## 2021-12-22 ENCOUNTER — Other Ambulatory Visit: Payer: Self-pay | Admitting: Internal Medicine

## 2021-12-27 ENCOUNTER — Other Ambulatory Visit: Payer: Self-pay | Admitting: Internal Medicine

## 2021-12-28 MED ORDER — ROSUVASTATIN CALCIUM 20 MG PO TABS: 20.0000 mg | ORAL_TABLET | Freq: Every day | ORAL | 1 refills | Status: DC

## 2022-01-30 ENCOUNTER — Other Ambulatory Visit: Payer: Self-pay | Admitting: Thoracic Surgery (Cardiothoracic Vascular Surgery)

## 2022-01-30 DIAGNOSIS — Q2543 Congenital aneurysm of aorta: Secondary | ICD-10-CM

## 2022-02-09 ENCOUNTER — Ambulatory Visit: Payer: 59 | Admitting: Internal Medicine

## 2022-02-16 ENCOUNTER — Ambulatory Visit (HOSPITAL_COMMUNITY)
Admission: RE | Admit: 2022-02-16 | Discharge: 2022-02-16 | Disposition: A | Discharge status: Home / Self Care | Payer: 59 | Source: Ambulatory Visit | Attending: Thoracic Surgery (Cardiothoracic Vascular Surgery) | Admitting: Thoracic Surgery (Cardiothoracic Vascular Surgery)

## 2022-02-16 DIAGNOSIS — Q2543 Congenital aneurysm of aorta: Secondary | ICD-10-CM | POA: Insufficient documentation

## 2022-02-16 DIAGNOSIS — E1159 Type 2 diabetes mellitus with other circulatory complications: Secondary | ICD-10-CM

## 2022-02-16 MED ORDER — IOHEXOL 350 MG/ML SOLN
75.0000 mL | Freq: Once | INTRAVENOUS | Status: AC | PRN
  Administered 2022-02-16: 75 mL via INTRAVENOUS

## 2022-02-19 LAB — POCT I-STAT CREATININE: Creatinine, Ser: 0.9 mg/dL (ref 0.61–1.24)

## 2022-02-20 ENCOUNTER — Other Ambulatory Visit: Payer: Self-pay | Admitting: Cardiovascular Disease

## 2022-02-23 ENCOUNTER — Encounter: Payer: Self-pay | Admitting: Internal Medicine

## 2022-02-23 ENCOUNTER — Ambulatory Visit: Payer: 59 | Admitting: Internal Medicine

## 2022-02-23 VITALS — BP 110/80 | HR 74 | Temp 97.8°F | Resp 18 | Ht 75.0 in | Wt 266.5 lb

## 2022-02-23 DIAGNOSIS — E1159 Type 2 diabetes mellitus with other circulatory complications: Secondary | ICD-10-CM

## 2022-02-23 LAB — HEMOGLOBIN A1C: Hgb A1c MFr Bld: 7.4 % — ABNORMAL HIGH (ref 4.6–6.5)

## 2022-02-23 LAB — MICROALBUMIN / CREATININE URINE RATIO
Creatinine,U: 210.7 mg/dL
Microalb Creat Ratio: 0.6 mg/g (ref 0.0–30.0)
Microalb, Ur: 1.3 mg/dL (ref 0.0–1.9)

## 2022-02-23 MED ORDER — VICTOZA 18 MG/3ML ~~LOC~~ SOPN: 1.8000 mg | PEN_INJECTOR | Freq: Every day | SUBCUTANEOUS | 5 refills | Status: DC

## 2022-02-23 MED ORDER — PEN NEEDLES 32G X 6 MM MISC: 3 refills | Status: DC

## 2022-02-23 NOTE — Progress Notes (Signed)
Subjective:    Patient ID: Anthony [name], male    DOB: 11/23/1955, 66 y.o.   MRN: [id number]  DOS:  02/23/2022 Type of visit - description: f/u  Since the last office visit, started Victoza. Initially had side effects but that has decreased. On his scales has lost approximately 13 pounds.  Denies chest pain or difficulty breathing.  No palpitations.  Wt Readings from Last 3 Encounters:  02/23/22 266 lb 8 oz (120.9 kg)  12/04/21 271 lb 12.8 oz (123.3 kg)  10/13/21 276 lb 4 oz (125.3 kg)    Review of Systems See above   Past Medical History:  Diagnosis Date   Aortic root enlargement (HCC)    CT 42 mm 2014   Chronic back pain    in 2022 pt states "it ended [place] ago"   Diabetes mellitus    type II   DJD (degenerative joint disease)    per MRI of LS spine 2005   GERD (gastroesophageal reflux disease)    Hyperlipidemia    Hypertension    Hypogonadism male    dx w/ hypogonadism elsewhere ~ 2011   Normal cardiac stress test    (-) stress test 2003, 2006   S/P cardiac cath     04-2006 neg per pt--CP improved w/ PPIs   Sleep apnea    uses cpap   Tachycardia    ER 07-2012, ?SCR, on atenolol    Past Surgical History:  Procedure Laterality Date   CARDIAC CATHETERIZATION     COLONOSCOPY     CYSTECTOMY     from back   KNEE ARTHROSCOPY WITH MENISCAL REPAIR Right 2021   POLYPECTOMY      Current Outpatient Medications  Medication Instructions   aspirin 81 mg, Oral, Daily,     atenolol (TENORMIN) 50 MG tablet TAKE 1/2 TABLET BY MOUTH EVERY DAY   glucose blood (ONETOUCH ULTRA) test strip CHECK BLOOD SUGAR NO MORE THAN TWICE DAILY. DX IS E11.9. MAX ON INSURANCE   hydrochlorothiazide (HYDRODIURIL) 25 mg, Oral, Daily   Insulin Pen Needle (PEN NEEDLES) 32G X 6 MM MISC To use w/ Victoza   losartan (COZAAR) 25 MG tablet TAKE 1 TABLET BY MOUTH EVERY DAY   metFORMIN (GLUCOPHAGE) 850 MG tablet TAKE 2 TABLETS BY MOUTH DAILY WITH BREAKFAST AND 1 TABLET DAILY WITH SUPPER.    pioglitazone (ACTOS) 30 mg, Oral, Daily   rosuvastatin (CRESTOR) 20 mg, Oral, Daily   Victoza 1.8 mg, Subcutaneous, Daily       Objective:   Physical Exam BP 110/80   Pulse 74   Temp 97.8 F (36.6 C) (Oral)   Resp 18   Ht '6\' 3"'$  (1.905 m)   Wt 266 lb 8 oz (120.9 kg)   SpO2 98%   BMI 33.31 kg/m  General:   Well developed, NAD, BMI noted. HEENT:  Normocephalic . Face symmetric, atraumatic Lungs:  CTA B Normal respiratory effort, no intercostal retractions, no accessory muscle use. Heart: RRR,  no murmur.  Lower extremities: no pretibial edema bilaterally  Skin: Not pale. Not jaundice Neurologic:  alert & oriented X3.  Speech normal, gait appropriate for age and unassisted Psych--  Cognition and judgment appear intact.  Cooperative with normal attention span and concentration.  Behavior appropriate. No anxious or depressed appearing.      Assessment   Assessment  DM HTN Hyperlipidemia CV Dr [name], next visit 08-2016 --Tachycardia : ER eval 2014,   rx atenolol --stress test 2003, 2006; Chest pain-2008,  (-)  cardiac cath, sx decrease with PPIs ---Aorta enlargment per CT  2014, saw surgery 12-2016, they will cont observation -Calcium coronary score 169, 03/20/2021, changed Pravachol to  Crestor OSA , severe - on Cpap GERD DJD, Chronic back pain, back MRI 2005 >>  OA Hypogonadism DX 2011, 2013:  FSH, LH and prolactin, declined Endo referral + FH Prostate ca brother age 65    PLAN  DM: Since the last visit he started Victoza, currently on 1.8 mg weekly, initially had nausea and diarrhea, currently with occasional nausea. He also takes metformin, Actos. Plan: Continue Victoza at current dose, will not be able to increase dose due to nausea.  If nausea becomes a major issue will decrease dose. Check A1c Strongly encouraged to take advantage of Victoza, change his diet habits, start a walking program. Preventive care: Declined flu shot RTC 3 months CPX

## 2022-02-23 NOTE — Assessment & Plan Note (Signed)
DM: Since the last visit he started Victoza, currently on 1.8 mg weekly, initially had nausea and diarrhea, currently with occasional nausea. He also takes metformin, Actos. Plan: Continue Victoza at current dose, will not be able to increase dose due to nausea.  If nausea becomes a major issue will decrease dose. Check A1c Strongly encouraged to take advantage of Victoza, change his diet habits, start a walking program. Preventive care: Declined flu shot RTC 3 months CPX

## 2022-02-23 NOTE — Patient Instructions (Addendum)
You are doing very good.  Watch her diet closely  Try to exercise regularly, taking a walk is perfect.   GO TO THE LAB : Get the blood work     GO TO THE FRONT DESK, [place] Come back for a physical exam in 3 months

## 2022-02-28 ENCOUNTER — Other Ambulatory Visit: Payer: Self-pay | Admitting: Internal Medicine

## 2022-03-08 ENCOUNTER — Ambulatory Visit: Payer: 59 | Admitting: Thoracic Surgery (Cardiothoracic Vascular Surgery)

## 2022-03-08 ENCOUNTER — Encounter: Payer: Self-pay | Admitting: Thoracic Surgery (Cardiothoracic Vascular Surgery)

## 2022-03-08 VITALS — BP 123/72 | HR 82 | Resp 20 | Ht 75.0 in | Wt 265.0 lb

## 2022-03-08 DIAGNOSIS — I7121 Aneurysm of the ascending aorta, without rupture: Secondary | ICD-10-CM | POA: Diagnosis not present

## 2022-03-08 DIAGNOSIS — Q2543 Congenital aneurysm of aorta: Secondary | ICD-10-CM

## 2022-03-08 NOTE — Progress Notes (Signed)
PCP is Colon Branch, MD Referring Provider is Josue Hector, MD  Chief Complaint  Patient presents with   Thoracic Aortic Aneurysm    6 month f/u with CTA Chest    HPI: Mr. Nham returns for follow-up of his aneurysmal sinuses of Valsalva.  Kallon Caylor is a 66 year old man with a history of hypertension, hyperlipidemia, type 2 diabetes, reflux, sleep apnea, and sinus of Valsalva aneurysm.  First noted to have a dilated aortic root on echocardiogram in 2014.  Has been followed since that time.  Continues to work as a Administrator.  Denies chest pain, pressure, tightness, shortness of breath.  Blood pressures been well-controlled.  Overall feels well.   Past Medical History:  Diagnosis Date   Aortic root enlargement ([place])    CT 42 mm 2014   Chronic back pain    in 2022 pt states "it ended [place] ago"   Diabetes mellitus    type II   DJD (degenerative joint disease)    per MRI of LS spine 2005   GERD (gastroesophageal reflux disease)    Hyperlipidemia    Hypertension    Hypogonadism male    dx w/ hypogonadism elsewhere ~ 2011   Normal cardiac stress test    (-) stress test 2003, 2006   S/P cardiac cath     04-2006 neg per pt--CP improved w/ PPIs   Sleep apnea    uses cpap   Tachycardia    ER 07-2012, ?SCR, on atenolol    Past Surgical History:  Procedure Laterality Date   CARDIAC CATHETERIZATION     COLONOSCOPY     CYSTECTOMY     from back   KNEE ARTHROSCOPY WITH MENISCAL REPAIR Right 2021   POLYPECTOMY      Family History  Problem Relation Age of Onset   Heart disease Father        CABG at age 70   Colon polyps Brother    Heart disease Brother        CABG   Sinusitis Brother    Diabetes Other        cousins   Prostate cancer Brother 74   Stomach cancer Paternal Uncle    Bladder Cancer Paternal Uncle    Colon cancer Neg Hx    Rectal cancer Neg Hx    Esophageal cancer Neg Hx     Social History Social History   Tobacco Use   Smoking status: Former     Types: Cigars    Quit date: 03/12/1981    Years since quitting: 41.0   Smokeless tobacco: Never  Vaping Use   Vaping Use: Never used  Substance Use Topics   Alcohol use: Yes    Alcohol/week: 1.0 standard drink of alcohol    Types: 1 Cans of beer per week    Comment: occasionally less than weekly   Drug use: No    Current Outpatient Medications  Medication Sig Dispense Refill   aspirin 81 MG tablet Take 81 mg by mouth daily.     atenolol (TENORMIN) 50 MG tablet TAKE 1/2 TABLET BY MOUTH EVERY DAY 15 tablet 2   glucose blood (ONETOUCH ULTRA) test strip CHECK BLOOD SUGAR NO MORE THAN TWICE DAILY. DX IS E11.9. MAX ON INSURANCE 100 strip 12   hydrochlorothiazide (HYDRODIURIL) 25 MG tablet Take 1 tablet (25 mg total) by mouth daily. 90 tablet 1   Insulin Pen Needle (PEN NEEDLES) 32G X 6 MM MISC To use w/ Victoza 100 each  3   liraglutide (VICTOZA) 18 MG/3ML SOPN Inject 1.8 mg into the skin daily. 9 mL 5   losartan (COZAAR) 25 MG tablet TAKE 1 TABLET BY MOUTH EVERY DAY 90 tablet 1   metFORMIN (GLUCOPHAGE) 850 MG tablet TAKE 2 TABLETS BY MOUTH DAILY WITH BREAKFAST AND 1 TABLET DAILY WITH SUPPER. 270 tablet 1   pioglitazone (ACTOS) 30 MG tablet Take 1 tablet (30 mg total) by mouth daily. 90 tablet 1   rosuvastatin (CRESTOR) 20 MG tablet Take 1 tablet (20 mg total) by mouth daily. 90 tablet 1   No current facility-administered medications for this visit.    No Known Allergies  Review of Systems  Constitutional:  Negative for activity change and unexpected weight change.  Respiratory:  Negative for chest tightness and shortness of breath.   Cardiovascular:  Negative for chest pain, palpitations and leg swelling.    BP 123/72   Pulse 82   Resp 20   Ht '6\' 3"'$  (1.905 m)   Wt 265 lb (120.2 kg)   SpO2 96% Comment: RA  BMI 33.12 kg/m  Physical Exam Constitutional:      General: He is not in acute distress. HENT:     Head: Normocephalic and atraumatic.  Eyes:     General: No scleral  icterus.    Extraocular Movements: Extraocular movements intact.  Cardiovascular:     Rate and Rhythm: Normal rate and regular rhythm.     Heart sounds: Normal heart sounds. No murmur heard.    No friction rub. No gallop.  Pulmonary:     Effort: Pulmonary effort is normal. No respiratory distress.     Breath sounds: Normal breath sounds.  Musculoskeletal:     Cervical back: Neck supple.     Right lower leg: No edema.     Left lower leg: No edema.  Skin:    General: Skin is warm and dry.  Neurological:     General: No focal deficit present.     Mental Status: He is alert and oriented to person, place, and time.     Cranial Nerves: No cranial nerve deficit.    Diagnostic Tests: CT ANGIOGRAPHY CHEST WITH CONTRAST   TECHNIQUE: Multidetector CT imaging of the chest was performed using the standard protocol during bolus administration of intravenous contrast. Multiplanar CT image reconstructions and MIPs were obtained to evaluate the vascular anatomy.   RADIATION DOSE REDUCTION: This exam was performed according to the departmental dose-optimization program which includes automated exposure control, adjustment of the mA and/or kV according to patient size and/or use of iterative reconstruction technique.   CONTRAST:  73m OMNIPAQUE IOHEXOL 350 MG/ML SOLN   COMPARISON:  Prior CTA of the chest 08/04/2021; 11/23/2016   FINDINGS: Cardiovascular: Ectatic/borderline aneurysmal dilation of the aortic root measured at 4.7 cm at the sinuses of Valsalva. No effacement of the sino-tubular junction. The ascending, transverse and descending thoracic aorta are normal in caliber. Mild tortuosity of the descending thoracic aorta. No significant atherosclerotic plaque within the aorta. However, scattered calcifications present along the coronary arteries. The heart is normal in size. No pericardial effusion.   Mediastinum/Nodes: Unremarkable CT appearance of the thyroid gland. No  suspicious mediastinal or hilar adenopathy. No soft tissue mediastinal mass. The thoracic esophagus is unremarkable.   Lungs/Pleura: The lungs are clear. Continued stability of small granuloma within the minor fissure.   Upper Abdomen: No acute abnormality.   Musculoskeletal: No chest wall abnormality. No acute or significant osseous findings.   Review of  the MIP images confirms the above findings.   IMPRESSION: 1. Continued ectasias/borderline aneurysmal dilation of the aortic root measured at 4.7 cm at the sinuses of Valsalva. This is within error of measurement compared to 4.9 cm as measured previously and is minimally different compared to 4.6 cm as measured in 2018. 2. Coronary artery calcifications. 3. No acute cardiopulmonary process.   Signed,   Criselda Peaches, MD, [place]   [facility]   [facility]     Electronically Signed   By: Jacqulynn Cadet M.D.   On: 02/16/2022 10:26 I personally reviewed the MR images.  No change in the sinus of Valsalva aneurysm measuring about 4.7 to 4.9 cm.  Impression: Anthony [name] is a 66 year old man with a history of hypertension, hyperlipidemia, type 2 diabetes, reflux, sleep apnea, and sinus of Valsalva aneurysm.  Sinus of Valsalva aneurysm-stable without 0.7 to 4.9 cm from study to study.  No significant change dating back to 2014.  Needs continued follow-up.  Will plan to MR in about 6 months.  Hypertension-blood pressure well-controlled on current regimen.  Hyperlipidemia-on Crestor.  Managed by Dr. Marlou Porch.  Plan: Return in 6 months with MR angio of chest  Melrose Nakayama, MD [facility] [phone]

## 2022-03-15 NOTE — Progress Notes (Signed)
I, Peterson Lombard, LAT, ATC acting as a scribe for Lynne Leader, MD.  Anthony [name] is a 67 y.o. male who presents to [place] at [facility] today for neck pain.  Patient was previously seen by Dr. Georgina Snell on 06/09/2021 for LBP.  Today, patient reports neck and shoulder pain intermittently for 6+ months. Patient locates pain to bilat trapz, into the midline of the lower portion of his neck/upper back, and into the anterior aspect of his chest.  Radiates: yes UE Numbness/tingling: yes- posterior aspect of bilat upper arm UE Weakness: no Aggravates: rotation Treatments tried: IBU, TENS unit, muscle relaxer  Pertinent review of systems: No fevers or chills  Relevant historical information: Thoracic aortic aneurysm without rupture.   Exam:  BP 118/74   Pulse 67   Ht '6\' 3"'$  (1.905 m)   Wt 275 lb (124.7 kg)   SpO2 98%   BMI 34.37 kg/m  General: Well Developed, well nourished, and in no acute distress.   MSK: C-spine: Normal. Nontender spinal midline. Tender palpation right trapezius and right cervical paraspinal musculature. Decree cervical motion. Upper extremity strength is intact. Reflexes are intact.  Shoulders bilaterally normal motion intact strength.  Negative impingement testing.    Lab and Radiology Results  X-ray images C-spine obtained today personally and independently interpreted Mild degenerative changes C5-6 C6-7 Await formal radiology review    EXAM: CT ANGIOGRAPHY CHEST WITH CONTRAST   TECHNIQUE: Multidetector CT imaging of the chest was performed using the standard protocol during bolus administration of intravenous contrast. Multiplanar CT image reconstructions and MIPs were obtained to evaluate the vascular anatomy.   RADIATION DOSE REDUCTION: This exam was performed according to the departmental dose-optimization program which includes automated exposure control, adjustment of the mA and/or kV according to patient size and/or  use of iterative reconstruction technique.   CONTRAST:  2m OMNIPAQUE IOHEXOL 350 MG/ML SOLN   COMPARISON:  Prior CTA of the chest 08/04/2021; 11/23/2016   FINDINGS: Cardiovascular: Ectatic/borderline aneurysmal dilation of the aortic root measured at 4.7 cm at the sinuses of Valsalva. No effacement of the sino-tubular junction. The ascending, transverse and descending thoracic aorta are normal in caliber. Mild tortuosity of the descending thoracic aorta. No significant atherosclerotic plaque within the aorta. However, scattered calcifications present along the coronary arteries. The heart is normal in size. No pericardial effusion.   Mediastinum/Nodes: Unremarkable CT appearance of the thyroid gland. No suspicious mediastinal or hilar adenopathy. No soft tissue mediastinal mass. The thoracic esophagus is unremarkable.   Lungs/Pleura: The lungs are clear. Continued stability of small granuloma within the minor fissure.   Upper Abdomen: No acute abnormality.   Musculoskeletal: No chest wall abnormality. No acute or significant osseous findings.   Review of the MIP images confirms the above findings.   IMPRESSION: 1. Continued ectasias/borderline aneurysmal dilation of the aortic root measured at 4.7 cm at the sinuses of Valsalva. This is within error of measurement compared to 4.9 cm as measured previously and is minimally different compared to 4.6 cm as measured in 2018. 2. Coronary artery calcifications. 3. No acute cardiopulmonary process.   Signed,   HCriselda Peaches MD, [address] [place]  [facility]   [facility]     Electronically Signed   By: HJacqulynn CadetM.D.   On: 02/16/2022 10:26  Assessment and Plan: 67y.o. male with chronic trapezius pain and neck pain bilaterally right worse than left.  This has been ongoing for about 6 months.  Pain thought to be predominantly due to muscle dysfunction or referred pain due  to degenerative changes from cervical spine.  He is a great candidate for trial of physical therapy.  Plan to refer to PT at [facility].  Additionally will use tizanidine at bedtime.  Also recommend heating pad and TENS unit.  Check back in about 6 weeks.  He had a CT scan of his chest in early December to follow-up the thoracic aortic aneurysm.  No obvious MSK explanation for his pain on the chest CT scan visible per my read.   PDMP not reviewed this encounter. Orders Placed This Encounter  Procedures   DG Cervical Spine 2 or 3 views    Standing Status:   Future    Standing Expiration Date:   03/17/2023    Order Specific Question:   Reason for Exam (SYMPTOM  OR DIAGNOSIS REQUIRED)    Answer:   eval neck pain    Order Specific Question:   Preferred imaging location?    Answer:   [facility]   Ambulatory referral to Physical Therapy    Referral Priority:   Routine    Referral Type:   Physical Medicine    Referral Reason:   Specialty Services Required    Requested Specialty:   Physical Therapy    Number of Visits Requested:   1   Meds ordered this encounter  Medications   tiZANidine (ZANAFLEX) 4 MG tablet    Sig: Take 1 tablet (4 mg total) by mouth at bedtime as needed for muscle spasms.    Dispense:  60 tablet    Refill:  1     Discussed warning signs or symptoms. Please see discharge instructions. Patient expresses understanding.   The above documentation has been reviewed and is accurate and complete Lynne Leader, M.D.

## 2022-03-16 ENCOUNTER — Ambulatory Visit (INDEPENDENT_AMBULATORY_CARE_PROVIDER_SITE_OTHER): Payer: 59

## 2022-03-16 ENCOUNTER — Ambulatory Visit: Payer: 59 | Admitting: Family Medicine

## 2022-03-16 VITALS — BP 118/74 | HR 67 | Ht 75.0 in | Wt 275.0 lb

## 2022-03-16 DIAGNOSIS — M542 Cervicalgia: Secondary | ICD-10-CM

## 2022-03-16 DIAGNOSIS — G8929 Other chronic pain: Secondary | ICD-10-CM

## 2022-03-16 MED ORDER — TIZANIDINE HCL 4 MG PO TABS: 4.0000 mg | ORAL_TABLET | Freq: Every evening | ORAL | 1 refills | Status: DC | PRN

## 2022-03-16 NOTE — Patient Instructions (Addendum)
Thank you for coming in today.   I've referred you to Physical Therapy.  Let us know if you don't hear from them in one week.   Please get an Xray today before you leave   Use the tizanidine as needed at bedtime.   Recheck in about 6 weeks.   Consider a heating pad.    TENS UNIT: This is helpful for muscle pain and spasm.   Search and Purchase a TENS 7000 2nd edition at  www.tenspros.com or www.[employer].com It should be less than $30.     TENS unit instructions: Do not shower or bathe with the unit on Turn the unit off before removing electrodes or batteries If the electrodes lose stickiness add a drop of water to the electrodes after they are disconnected from the unit and place on plastic sheet. If you continued to have difficulty, call the TENS unit company to purchase more electrodes. Do not apply lotion on the skin area prior to use. Make sure the skin is clean and dry as this will help prolong the life of the electrodes. After use, always check skin for unusual red areas, rash or other skin difficulties. If there are any skin problems, does not apply electrodes to the same area. Never remove the electrodes from the unit by pulling the wires. Do not use the TENS unit or electrodes other than as directed. Do not change electrode placement without consultating your therapist or physician. Keep 2 fingers with between each electrode. Wear time ratio is 2:1, on to off times.    For example on for 30 minutes off for 15 minutes and then on for 30 minutes off for 15 minutes

## 2022-03-19 NOTE — Progress Notes (Signed)
Cervical spine x-ray shows arthritis changes read as medium to severe.

## 2022-03-30 ENCOUNTER — Encounter: Payer: Self-pay | Admitting: Cardiovascular Disease

## 2022-04-06 LAB — HM DIABETES EYE EXAM

## 2022-04-16 ENCOUNTER — Other Ambulatory Visit: Payer: Self-pay | Admitting: Internal Medicine

## 2022-04-25 NOTE — Progress Notes (Signed)
   I, Anthony [name], LAT, ATC acting as a scribe for Anthony Leader, MD.  Anthony [name] is a 67 y.o. male who presents to [place] at [facility] today for 6-wk f/u neck pain. Pt was last seen by Dr. Georgina Snell on 03/16/22 and was advised to use a heating pad, TENS unit, and was prescribed tizanidine. Pt was also referred to PT, but declined due to cost. Today, pt reports improvement of neck sx. As been ising TENs and weighted heating pad. Has not taken med that was prescribed d/t interaction with Atenolol/low BP. Shoulder sx have been well managed. Orders a special pillow that has been helpful for neck and shoulder sx. Sx are manageable. Has not been for PT, wanted to hold off d/t it being the start of the year and unmet deductibles.   Dx imaging: 03/16/22 C-spine XR  Pertinent review of systems: No fevers or chills  Relevant historical information: Hypertension.  Diabetes.   Exam:  BP 126/82   Pulse (!) 56   Ht 6' 3"$  (1.905 m)   Wt 275 lb 3.2 oz (124.8 kg)   SpO2 100%   BMI 34.40 kg/m  General: Well Developed, well nourished, and in no acute distress.   MSK: C-spine: Decreased cervical motion.    Lab and Radiology Results  EXAM: CERVICAL SPINE - 2-3 VIEW   COMPARISON:  None Available.   FINDINGS: There is no evidence of cervical spine fracture or prevertebral soft tissue swelling. Alignment is normal. Mild to moderate severity endplate sclerosis is seen throughout all levels of the cervical spine. Mild anterior osteophyte formation is also noted at the level of C4-C5. Mild multilevel intervertebral disc space narrowing is also seen. This is slightly more prominent at the levels of C4-C5 and C5-C6. No other significant bone abnormalities are identified.   IMPRESSION: Mild to moderate severity multilevel degenerative changes, slightly more prominent at the levels of C4-C5 and C5-C6.     Electronically Signed   By: Virgina Norfolk M.D.   On: 03/16/2022  22:39 I, Anthony [name], personally (independently) visualized and performed the interpretation of the images attached in this note.      Assessment and Plan: 67 y.o. male with chronic neck pain.  Thought to be due to muscle spasm and dysfunction and exacerbation of degenerative changes.  He is doing pretty well with self-care techniques at home including home exercise program heating pad TENS unit and a good pillow.  Physical therapy would be helpful in the future if needed.  He will let me know if you would like that referral placed again.  Check back as needed.  Precautions reviewed. Total encounter time 20 minutes including face-to-face time with the patient and, reviewing past medical record, and charting on the date of service.       Discussed warning signs or symptoms. Please see discharge instructions. Patient expresses understanding.   The above documentation has been reviewed and is accurate and complete Anthony [name], M.D.

## 2022-04-26 ENCOUNTER — Other Ambulatory Visit: Payer: Self-pay | Admitting: Family

## 2022-04-27 ENCOUNTER — Ambulatory Visit: Payer: 59 | Admitting: Family Medicine

## 2022-04-27 ENCOUNTER — Encounter: Payer: Self-pay | Admitting: Family Medicine

## 2022-04-27 VITALS — BP 126/82 | HR 56 | Ht 75.0 in | Wt 275.2 lb

## 2022-04-27 DIAGNOSIS — G8929 Other chronic pain: Secondary | ICD-10-CM | POA: Diagnosis not present

## 2022-04-27 DIAGNOSIS — M542 Cervicalgia: Secondary | ICD-10-CM | POA: Diagnosis not present

## 2022-04-27 NOTE — Patient Instructions (Addendum)
Thank you for coming in today.   Continue home exercises.   Let me know if you want PT.

## 2022-05-02 ENCOUNTER — Encounter: Payer: Self-pay | Admitting: Internal Medicine

## 2022-05-02 MED ORDER — VICTOZA 18 MG/3ML ~~LOC~~ SOPN: 1.8000 mg | PEN_INJECTOR | Freq: Every day | SUBCUTANEOUS | 5 refills | Status: DC

## 2022-05-02 MED ORDER — ROSUVASTATIN CALCIUM 20 MG PO TABS: 20.0000 mg | ORAL_TABLET | Freq: Every day | ORAL | 1 refills | Status: DC

## 2022-05-02 MED ORDER — HYDROCHLOROTHIAZIDE 25 MG PO TABS: 25.0000 mg | ORAL_TABLET | Freq: Every day | ORAL | 1 refills | Status: DC

## 2022-05-02 MED ORDER — LOSARTAN POTASSIUM 25 MG PO TABS: 25.0000 mg | ORAL_TABLET | Freq: Every day | ORAL | 1 refills | Status: DC

## 2022-05-02 MED ORDER — PIOGLITAZONE HCL 30 MG PO TABS: 30.0000 mg | ORAL_TABLET | Freq: Every day | ORAL | 1 refills | Status: DC

## 2022-05-02 MED ORDER — METFORMIN HCL 850 MG PO TABS: ORAL_TABLET | ORAL | 1 refills | Status: DC

## 2022-05-22 ENCOUNTER — Other Ambulatory Visit: Payer: Self-pay | Admitting: Internal Medicine

## 2022-05-25 ENCOUNTER — Encounter: Payer: Self-pay | Admitting: Internal Medicine

## 2022-05-25 ENCOUNTER — Ambulatory Visit (INDEPENDENT_AMBULATORY_CARE_PROVIDER_SITE_OTHER): Payer: 59 | Admitting: Internal Medicine

## 2022-05-25 VITALS — BP 120/62 | HR 62 | Temp 97.8°F | Resp 16 | Ht 75.0 in | Wt 275.2 lb

## 2022-05-25 DIAGNOSIS — E785 Hyperlipidemia, unspecified: Secondary | ICD-10-CM

## 2022-05-25 DIAGNOSIS — Z0000 Encounter for general adult medical examination without abnormal findings: Secondary | ICD-10-CM | POA: Diagnosis not present

## 2022-05-25 DIAGNOSIS — E1159 Type 2 diabetes mellitus with other circulatory complications: Secondary | ICD-10-CM

## 2022-05-25 DIAGNOSIS — I10 Essential (primary) hypertension: Secondary | ICD-10-CM | POA: Diagnosis not present

## 2022-05-25 LAB — LIPID PANEL
Cholesterol: 100 mg/dL (ref 0–200)
HDL: 40.7 mg/dL (ref >39.00)
LDL Cholesterol: 46 mg/dL (ref 0–99)
NonHDL: 58.96
Total CHOL/HDL Ratio: 2
Triglycerides: 66 mg/dL (ref 0.0–149.0)
VLDL: 13.2 mg/dL (ref 0.0–40.0)

## 2022-05-25 LAB — BASIC METABOLIC PANEL
BUN: 18 mg/dL (ref 6–23)
CO2: 30 mEq/L (ref 19–32)
Calcium: 9.3 mg/dL (ref 8.4–10.5)
Chloride: 102 mEq/L (ref 96–112)
Creatinine, Ser: 0.91 mg/dL (ref 0.40–1.50)
GFR: 88.95 mL/min (ref >60.00)
Glucose, Bld: 121 mg/dL — ABNORMAL HIGH (ref 70–99)
Potassium: 4.2 mEq/L (ref 3.5–5.1)
Sodium: 139 mEq/L (ref 135–145)

## 2022-05-25 LAB — PSA: PSA: 1.39 ng/mL (ref 0.10–4.00)

## 2022-05-25 LAB — HEMOGLOBIN A1C: Hgb A1c MFr Bld: 7.7 % — ABNORMAL HIGH (ref 4.6–6.5)

## 2022-05-25 LAB — ALT: ALT: 15 U/L (ref 0–53)

## 2022-05-25 LAB — AST: AST: 15 U/L (ref 0–37)

## 2022-05-25 NOTE — Progress Notes (Unsigned)
Subjective:    Patient ID: Anthony [name], male    DOB: 01-18-1956, 67 y.o.   MRN: [id number]  DOS:  05/25/2022 Type of visit - description: CPX  Here for CPX. Denies chest pain or difficulty breathing. No GI or GU symptoms. Saw sports medicine regarding shoulder pain, was prescribed Zanaflex,pt concerned about interactions.  Review of Systems  Other than above, a 14 point review of systems is negative     Past Medical History:  Diagnosis Date   Aortic root enlargement ([place])    CT 42 mm 2014   Chronic back pain    in 2022 pt states "it ended [place] ago"   Diabetes mellitus    type II   DJD (degenerative joint disease)    per MRI of LS spine 2005   GERD (gastroesophageal reflux disease)    Hyperlipidemia    Hypertension    Hypogonadism male    dx w/ hypogonadism elsewhere ~ 2011   Normal cardiac stress test    (-) stress test 2003, 2006   S/P cardiac cath     04-2006 neg per pt--CP improved w/ PPIs   Sleep apnea    uses cpap   Tachycardia    ER 07-2012, ?SCR, on atenolol    Past Surgical History:  Procedure Laterality Date   CARDIAC CATHETERIZATION     COLONOSCOPY     CYSTECTOMY     from back   KNEE ARTHROSCOPY WITH MENISCAL REPAIR Right 2021   POLYPECTOMY     Social History   Socioeconomic History   Marital status: Married    Spouse name: Not on file   Number of children: 2   Years of education: Not on file   Highest education level: Not on file  Occupational History   Occupation: TRUCK DRIVER  Tobacco Use   Smoking status: Former    Types: Cigars    Quit date: 03/12/1981    Years since quitting: 41.2   Smokeless tobacco: Never  Vaping Use   Vaping Use: Never used  Substance and Sexual Activity   Alcohol use: Yes    Alcohol/week: 1.0 standard drink of alcohol    Types: 1 Cans of beer per week    Comment: occasionally less than weekly   Drug use: No   Sexual activity: Not on file  Other Topics Concern   Not on file  Social History Narrative    Truck driver (local); lives w/ wife   Social Determinants of Health   Financial Resource Strain: Not on file  Food Insecurity: Not on file  Transportation Needs: Not on file  Physical Activity: Not on file  Stress: Not on file  Social Connections: Not on file  Intimate Partner Violence: Not on file     Current Outpatient Medications  Medication Instructions   aspirin 81 mg, Oral, Daily,     atenolol (TENORMIN) 25 mg, Oral, Daily   hydrochlorothiazide (HYDRODIURIL) 25 mg, Oral, Daily   Insulin Pen Needle (PEN NEEDLES) 32G X 6 MM MISC To use w/ Victoza   losartan (COZAAR) 25 mg, Oral, Daily   metFORMIN (GLUCOPHAGE) 850 MG tablet TAKE 2 TABLETS BY MOUTH DAILY WITH BREAKFAST AND 1 TABLET DAILY WITH SUPPER.   ONETOUCH ULTRA test strip CHECK BLOOD SUGAR NO MORE THAN TWICE DAILY. DX IS E11.9. MAX ON INSURANCE   pioglitazone (ACTOS) 30 mg, Oral, Daily   rosuvastatin (CRESTOR) 20 mg, Oral, Daily   tiZANidine (ZANAFLEX) 4 mg, Oral, At bedtime PRN  Victoza 1.8 mg, Subcutaneous, Daily       Objective:   Physical Exam BP 120/62   Pulse 62   Temp 97.8 F (36.6 C) (Oral)   Resp 16   Ht 6\' 3"  (1.905 m)   Wt 275 lb 4 oz (124.9 kg)   SpO2 97%   BMI 34.40 kg/m  General: Well developed, NAD, BMI noted Neck: No  thyromegaly  HEENT:  Normocephalic . Face symmetric, atraumatic Lungs:  CTA B Normal respiratory effort, no intercostal retractions, no accessory muscle use. Heart: RRR,  no murmur.  Abdomen:  Not distended, soft, non-tender. No rebound or rigidity.   Lower extremities: no pretibial edema bilaterally DRE: Normal sphincter tone, no stools, prostate normal Skin: Exposed areas without rash. Not pale. Not jaundice Neurologic:  alert & oriented X3.  Speech normal, gait appropriate for age and unassisted Strength symmetric and appropriate for age.  Psych: Cognition and judgment appear intact.  Cooperative with normal attention span and concentration.  Behavior  appropriate. No anxious or depressed appearing.     Assessment     Assessment  DM HTN Hyperlipidemia CV Dr [name], next visit 08-2016 --Tachycardia : ER eval 2014,   rx atenolol --stress test 2003, 2006; Chest pain-2008,  (-) cardiac cath, sx decrease with PPIs ---Aorta enlargment per CT  2014, saw surgery 12-2016, they will cont observation -Calcium coronary score 169, 03/20/2021, changed Pravachol to  Crestor OSA , severe - on Cpap GERD DJD, Chronic back pain, back MRI 2005 >>  OA Hypogonadism DX 2011, 2013:  FSH, LH and prolactin, declined Endo referral + FH Prostate ca brother age 52    PLAN  Here for CPX DM: Currently on Victoza, metformin, pioglitazone, last A1c improving.  Check labs, encourage healthy lifestyle. HTN: Well-controlled, continue atenolol, HCTZ, losartan.  Check labs. Hyperlipidemia: On rosuvastatin.  Labs. DJD: Recently prescribed Zanaflex, it could interact with atenolol causing low blood pressure, if needed I think he could try monitoring his BP carefully and looking for low BP symptoms. FH CAD: On aspirin, rosuvastatin, no symptoms RTC 6 months

## 2022-05-25 NOTE — Patient Instructions (Addendum)
  Check the  blood pressure regularly BP GOAL is between 110/65 and  135/85. If it is consistently higher or lower, let me know    GO TO THE LAB : Get the blood work     [employer], [place] back for a checkup in 6 months   Vaccines I recommend:  Covid booster RSV vaccine Flu shot every fall  Please bring [place] a copy of your Healthcare Power of Attorney for your chart.

## 2022-05-26 ENCOUNTER — Encounter: Payer: Self-pay | Admitting: Internal Medicine

## 2022-05-26 NOTE — Assessment & Plan Note (Signed)
-   Tdap 05/2018 - zostavax 2014, s/p shingrix - pnm shot 2014; prevnar 05-2015 - covid vaccine:  declines  - flu sho q fall  -+ FH Prostate cancer: DRE normal today, no symptoms, check PSA. -CCS: s/p multiple  Cscopes,   11-2013,   02/2017 + polyps, cscope 10-2020, no polyps, 5 years per report -diet  and exercise: Discussed. -LABS: BMP AST ALT FLP A1c PSA -Advance care planning information provide

## 2022-05-26 NOTE — Assessment & Plan Note (Signed)
Here for CPX DM: Currently on Victoza, metformin, pioglitazone, last A1c improving.  Check labs, encourage healthy lifestyle. HTN: Well-controlled, continue atenolol, HCTZ, losartan.  Check labs. Hyperlipidemia: On rosuvastatin.  Labs. DJD: Recently prescribed Zanaflex, it could interact with atenolol causing low blood pressure, if needed I think he could try monitoring his BP carefully and looking for low BP symptoms. FH CAD: On aspirin, rosuvastatin, no symptoms RTC 6 months

## 2022-05-28 MED ORDER — PIOGLITAZONE HCL 45 MG PO TABS: 45.0000 mg | ORAL_TABLET | Freq: Every day | ORAL | 1 refills | Status: DC

## 2022-05-28 NOTE — Addendum Note (Signed)
Addended byDamita Dunnings D on: 05/28/2022 08:01 AM   Modules accepted: Orders

## 2022-06-01 ENCOUNTER — Other Ambulatory Visit: Payer: Self-pay | Admitting: Family Medicine

## 2022-06-01 NOTE — Telephone Encounter (Signed)
Rx refill request approved per Dr. Corey's orders. 

## 2022-06-12 ENCOUNTER — Encounter: Payer: Self-pay | Admitting: Internal Medicine

## 2022-06-13 ENCOUNTER — Telehealth: Payer: Self-pay | Admitting: Internal Medicine

## 2022-06-13 MED ORDER — SEMAGLUTIDE (1 MG/DOSE) 4 MG/3ML ~~LOC~~ SOPN: 1.0000 mg | PEN_INJECTOR | SUBCUTANEOUS | 1 refills | Status: DC

## 2022-06-13 NOTE — Telephone Encounter (Signed)
Okay to change to Ozempic 1 mg weekly, depending on his sugars we could increase the dose. Let him know

## 2022-06-13 NOTE — Telephone Encounter (Signed)
Pt called to advise that victoza still out of stock and he would like to know if he can get ozempic since they have that available [facility]. Pt sent mychart message but wasn't sure if provider saw yet. Please call to update.

## 2022-06-13 NOTE — Telephone Encounter (Signed)
Message reached by Pt via mychart this morning. Awaiting PCP response.

## 2022-06-13 NOTE — Addendum Note (Signed)
Addended byDamita Dunnings D on: 06/13/2022 01:07 PM   Modules accepted: Orders

## 2022-07-08 NOTE — Progress Notes (Signed)
Cardiology Office Note:    Date:  07/13/2022   ID:  [name], DOB 06-Jan-1956, MRN [id number]  PCP:  Anthony Plump, MD   [facility] Providers Cardiologist:  Anthony Haws, MD     Referring MD: Anthony Plump, MD    History of Present Illness:    Anthony [name] is a 67 y.o. male with family history of CAD Seen by Dr Anthony [name] for atypical chest pain and dyspnea 03/03/21  Treated for OSA with CPAP.     Brother had CABG in 1998.  He is worried about his family history.  Had normal heart cath in 2008.  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  Dr. Dorris [name] following as well.  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 Weight down some   [facility] some [facility] at [facility] for his aneurysm   Past Medical History:  Diagnosis Date   Aortic root enlargement (HCC)    CT 42 mm 2014   Chronic back pain    in 2022 pt states "it ended Anthony [name] ago"   Diabetes mellitus    type II   DJD (degenerative joint disease)    per MRI of LS spine 2005   GERD (gastroesophageal reflux disease)    Hyperlipidemia    Hypertension    Hypogonadism male    dx w/ hypogonadism elsewhere ~ 2011   Normal cardiac stress test    (-) stress test 2003, 2006   S/P cardiac cath     04-2006 neg per pt--CP improved w/ PPIs   Sleep apnea    uses cpap   Tachycardia    ER 07-2012, ?SCR, on atenolol    Past Surgical History:  Procedure Laterality Date   CARDIAC CATHETERIZATION     COLONOSCOPY     CYSTECTOMY     from back   KNEE ARTHROSCOPY WITH MENISCAL REPAIR Right 2021   POLYPECTOMY      Current Medications: Current Meds  Medication Sig   aspirin 81 MG tablet Take 81 mg by mouth daily.   atenolol (TENORMIN) 50 MG tablet TAKE 1/2 TABLET BY MOUTH DAILY   benzonatate (TESSALON) 200 MG capsule Take 200 mg by mouth as needed for cough.   fluticasone (FLONASE) 50 MCG/ACT nasal spray Place 1  spray into both nostrils daily.   hydrochlorothiazide (HYDRODIURIL) 25 MG tablet Take 1 tablet (25 mg total) by mouth daily.   Insulin Pen Needle (PEN NEEDLES) 32G X 6 MM MISC To use w/ Victoza   ipratropium (ATROVENT) 0.06 % nasal spray Place 2 sprays into both nostrils 3 (three) times daily.   losartan (COZAAR) 25 MG tablet Take 1 tablet (25 mg total) by mouth daily.   metFORMIN (GLUCOPHAGE) 850 MG tablet TAKE 2 TABLETS BY MOUTH DAILY WITH BREAKFAST AND 1 TABLET DAILY WITH SUPPER.   ONETOUCH ULTRA test strip CHECK BLOOD SUGAR NO MORE THAN TWICE DAILY. DX IS E11.9. MAX ON INSURANCE   pioglitazone (ACTOS) 45 MG tablet Take 1 tablet (45 mg total) by mouth daily.   rosuvastatin (CRESTOR) 20 MG tablet Take 1 tablet (20 mg total) by mouth daily.   Semaglutide, 1 MG/DOSE, 4 MG/3ML SOPN Inject 1 mg into the skin once a week.   tiZANidine (ZANAFLEX) 4 MG tablet TAKE 1 TABLET BY MOUTH AT BEDTIME AS NEEDED FOR MUSCLE SPASMS.     Allergies:  Patient has no known allergies.   Social History   Socioeconomic History   Marital status: Married    Spouse name: Not on file   Number of children: 2   Years of education: Not on file   Highest education level: Not on file  Occupational History   Occupation: TRUCK DRIVER  Tobacco Use   Smoking status: Former    Types: Cigars    Quit date: 03/12/1981    Years since quitting: 41.3   Smokeless tobacco: Never  Vaping Use   Vaping Use: Never used  Substance and Sexual Activity   Alcohol use: Yes    Alcohol/week: 1.0 standard drink of alcohol    Types: 1 Cans of beer per week    Comment: occasionally less than weekly   Drug use: No   Sexual activity: Not on file  Other Topics Concern   Not on file  Social History Narrative   Truck driver (local); lives w/ wife   Social Determinants of Health   Financial Resource Strain: Not on file  Food Insecurity: Not on file  Transportation Needs: Not on file  Physical Activity: Not on file  Stress: Not on  file  Social Connections: Not on file     Family History: The patient's family history includes Bladder Cancer in his paternal uncle; Colon polyps in his brother; Diabetes in an other family member; Heart disease in his brother and father; Prostate cancer (age of onset: 22) in his brother; Sinusitis in his brother; Stomach cancer in his paternal uncle. There is no history of Colon cancer, Rectal cancer, or Esophageal cancer.  ROS:   Please see the history of present illness.    No fevers chills nausea vomiting syncope bleeding all other systems reviewed and are negative.  EKGs/Labs/Other Studies Reviewed:    Cardiac CTA 03-29-2021 see HPI Echo 09/06/15: Study Conclusions   - Left ventricle: The cavity size was normal. There was mild focal    basal hypertrophy of the septum. Systolic function was vigorous.    The estimated ejection fraction was in the range of 65% to 70%.    Wall motion was normal; there were no regional wall motion    abnormalities. Features are consistent with a pseudonormal left    ventricular filling pattern, with concomitant abnormal relaxation    and increased filling pressure (grade 2 diastolic dysfunction).  - Aorta: Aortic root dimension: 44 mm (ED) sinus of Valsalva.  - Aortic root: The aortic root was moderately dilated.  - Left atrium: The atrium was mildly dilated. Volume/bsa, ES,    (1-plane Simpson&'s, A2C): 35.8 ml/m^2.  - Right ventricle: The cavity size was mildly dilated. Wall    thickness was normal.   Impressions:   - Previous echo 2015 - aortic root 43mm. No significant change.   EKG:  07/13/2022 SR rate 71 normal other than PR 216 msec   Recent Labs: 10/13/2021: Hemoglobin 13.6; Platelets 244.0 05/25/2022: ALT 15; BUN 18; Creatinine, Ser 0.91; Potassium 4.2; Sodium 139  Recent Lipid Panel    Component Value Date/Time   CHOL 100 05/25/2022 0926   TRIG 66.0 05/25/2022 0926   HDL 40.70 05/25/2022 0926   CHOLHDL 2 05/25/2022 0926   VLDL 13.2  05/25/2022 0926   LDLCALC 46 05/25/2022 0926       Physical Exam:    VS:  BP 128/84   Pulse 71   Ht 6\' 3"  (1.905 m)   Wt 273 lb 6.4 oz (124 kg)   SpO2  96%   BMI 34.17 kg/m     Wt Readings from Last 3 Encounters:  07/13/22 273 lb 6.4 oz (124 kg)  05/25/22 275 lb 4 oz (124.9 kg)  04/27/22 275 lb 3.2 oz (124.8 kg)     Affect appropriate Healthy:  appears stated age HEENT: normal Neck supple with no adenopathy JVP normal no bruits no thyromegaly Lungs clear with no wheezing and good diaphragmatic motion Heart:  S1/S2 no murmur, no rub, gallop or click PMI normal Abdomen: benighn, BS positve, no tenderness, no AAA no bruit.  No HSM or HJR Distal pulses intact with no bruits No edema Neuro non-focal Skin warm and dry No muscular weakness   ASSESSMENT:    1. Essential hypertension   2. 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 Chest pain of uncertain etiology     PLAN:    Chest Pain:  non cardiac cardiac CTA 03/20/21 CAD RADS 1  HLD:  changed to crestor given high calcium score LDL 70 Aortic Aneurysm:  4.3 cm on CTA 03/20/21 observe Sinus measures 4.7 cm  HTN:  Well controlled.  Continue current medications and low sodium Dash type diet.   DM:  Discussed low carb diet.  Target hemoglobin A1c is 6.5 or less.  Continue current medications.  F/U in a year   Anthony Haws MD [facility]

## 2022-07-13 ENCOUNTER — Ambulatory Visit: Payer: 59 | Attending: Cardiovascular Disease | Admitting: Cardiovascular Disease

## 2022-07-13 VITALS — BP 128/84 | HR 71 | Ht 75.0 in | Wt 273.4 lb

## 2022-07-13 DIAGNOSIS — I7121 Aneurysm of the ascending aorta, without rupture: Secondary | ICD-10-CM | POA: Diagnosis not present

## 2022-07-13 DIAGNOSIS — R079 Chest pain, unspecified: Secondary | ICD-10-CM | POA: Diagnosis not present

## 2022-07-13 DIAGNOSIS — I10 Essential (primary) hypertension: Secondary | ICD-10-CM

## 2022-07-13 NOTE — Patient Instructions (Addendum)
Medication Instructions:  Your physician recommends that you continue on your current medications as directed. Please refer to the Current Medication list given to you today.  *If you need a refill on your cardiac medications before your next appointment, please call your pharmacy*  Lab Work: If you have labs (blood work) drawn today and your tests are completely normal, you will receive your results only by: MyChart Message (if you have MyChart) OR A paper copy in the mail If you have any lab test that is abnormal or we need to change your treatment, we will call you to review the results.  Testing/Procedures: None ordered today.  Follow-Up: At [facility], you and your health needs are our priority.  As part of our continuing mission to provide you with exceptional heart care, we have created designated Provider Care Teams.  These Care Teams include your primary Cardiologist (physician) and Advanced Practice Providers (APPs -  Physician Assistants and Nurse Practitioners) who all work together to provide you with the care you need, when you need it.  We recommend signing up for the patient portal called "MyChart".  Sign up information is provided on this After Visit Summary.  MyChart is used to connect with patients for Virtual Visits (Telemedicine).  Patients are able to view lab/test results, encounter notes, upcoming appointments, etc.  Non-urgent messages can be sent to your provider as well.   To learn more about what you can do with MyChart, go to [email or web address].    Your next appointment:   12 month(s)  Provider:   Peter Nishan, MD     

## 2022-07-25 ENCOUNTER — Other Ambulatory Visit: Payer: Self-pay | Admitting: Thoracic Surgery (Cardiothoracic Vascular Surgery)

## 2022-07-25 DIAGNOSIS — Q2543 Congenital aneurysm of aorta: Secondary | ICD-10-CM

## 2022-07-26 ENCOUNTER — Ambulatory Visit (INDEPENDENT_AMBULATORY_CARE_PROVIDER_SITE_OTHER): Payer: 59 | Admitting: Nurse Practitioner

## 2022-07-26 ENCOUNTER — Encounter: Payer: Self-pay | Admitting: Nurse Practitioner

## 2022-07-26 VITALS — BP 121/81 | HR 75 | Temp 98.2°F | Ht 75.0 in | Wt 266.0 lb

## 2022-07-26 DIAGNOSIS — E785 Hyperlipidemia, unspecified: Secondary | ICD-10-CM

## 2022-07-26 DIAGNOSIS — E1159 Type 2 diabetes mellitus with other circulatory complications: Secondary | ICD-10-CM | POA: Diagnosis not present

## 2022-07-26 DIAGNOSIS — I10 Essential (primary) hypertension: Secondary | ICD-10-CM

## 2022-07-26 DIAGNOSIS — Z0289 Encounter for other administrative examinations: Secondary | ICD-10-CM

## 2022-07-26 DIAGNOSIS — G4733 Obstructive sleep apnea (adult) (pediatric): Secondary | ICD-10-CM | POA: Diagnosis not present

## 2022-07-26 DIAGNOSIS — Z6833 Body mass index (BMI) 33.0-33.9, adult: Secondary | ICD-10-CM

## 2022-07-26 NOTE — Patient Instructions (Signed)
Thank you for enrolling in MyChart. Please follow the instructions below to securely access your online medical record. MyChart allows you to send messages to your doctor, view your test results, renew your prescriptions, schedule appointments, and more.  How Do I Sign Up? In your Internet browser, go to [email or web address]. Click on the New  User? link in the Sign In box.  Enter your MyChart Access Code exactly as it appears below. You will not need to use this code after you have completed the sign-up process. If you do not sign up before the expiration date, you must request a new code. MyChart Access Code: Activation code not generated Current MyChart Status: Active  Enter the last four digits of your Social Security Number (xxxx) and Date of Birth (mm/dd/yyyy) as indicated and click Next. You will be taken to the next sign-up page. Create a MyChart ID. This will be your MyChart login ID and cannot be changed, so think of one that is secure and easy to remember. Create a MyChart password. You can change your password at any time. Enter your Password Reset Question and Answer and click Next. This can be used at a later time if you forget your password.  Select your communication preference, and if applicable enter your e-mail address. You will receive e-mail notification when new information is available in MyChart by choosing to receive e-mail notifications and filling in your e-mail. Click Sign In. You can now view your medical record.   Additional Information If you have questions, you can email [email or web address] or call [phone] to talk to our MyChart staff. Remember, MyChart is NOT to be used for urgent needs. For medical emergencies, dial 911.  

## 2022-07-26 NOTE — Progress Notes (Signed)
Office: [phone]  /  Fax: [phone]   Initial Visit  Anthony [name] was seen in clinic today to evaluate for obesity. He is interested in losing weight to improve overall health and reduce the risk of weight related complications. He presents today to review program treatment options, initial physical assessment, and evaluation.     He was referred by: Friend or Family  When asked what else they would like to accomplish? He states: Adopt healthier eating patterns, Improve existing medical conditions, Reduce number of medications, Improve quality of life, and Lose a target amount of weight : Goal weight:  230 lbs.    Weight history:  He started gaining weight in his late 60s.  His weight has fluctuated over the years.   When asked how has your weight affected you? He states: Contributed to medical problems, Having fatigue, Having poor endurance, and Problems with eating patterns  Some associated conditions: atrial tachycardia, HTN, thoracic aortic aneurysm, OSAS, GERD, DMT2, hypogonadism, depression, HLD,   Contributing factors: Family history, Nutritional, Stress, Reduced physical activity, Eating patterns, and Life event  Weight promoting medications identified: None  Current nutrition plan: None  He has tried multiple weight loss diets.   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  He struggles to find motivation to exercise.  He gets up at 4am and gets home from work 3pm-6pm.   Current or previous pharmacotherapy: None  Response to medication: Never tried medications   Past medical history includes:   Past Medical History:  Diagnosis Date   Aortic root enlargement (HCC)    CT 42 mm 2014   Chronic back pain    in 2022 pt states "it ended Strohecker ago"   Diabetes mellitus    type II   DJD (degenerative joint disease)    per MRI of LS spine 2005   GERD (gastroesophageal reflux disease)    Hyperlipidemia    Hypertension     Hypogonadism male    dx w/ hypogonadism elsewhere ~ 2011   Normal cardiac stress test    (-) stress test 2003, 2006   S/P cardiac cath     04-2006 neg per pt--CP improved w/ PPIs   Sleep apnea    uses cpap   Tachycardia    ER 07-2012, ?SCR, on atenolol     Objective:   BP 121/81   Pulse 75   Temp 98.2 F (36.8 C)   Ht 6\' 3"  (1.905 m)   Wt 266 lb (120.7 kg)   SpO2 100%   BMI 33.25 kg/m  He was weighed on the bioimpedance scale: Body mass index is 33.25 kg/m.  Peak Weight:292 lbs , Body Fat%:33.2%, Visceral Fat Rating:19, Weight trend over the last 12 months: fluctuated  General:  Alert, oriented and cooperative. Patient is in no acute distress.  Respiratory: Normal respiratory effort, no problems with respiration noted   Gait: able to ambulate independently  Mental Status: Normal mood and affect. Normal behavior. Normal judgment and thought content.   DIAGNOSTIC DATA REVIEWED:  BMET    Component Value Date/Time   NA 139 05/25/2022 0926   NA 141 03/03/2021 1501   K 4.2 05/25/2022 0926   CL 102 05/25/2022 0926   CO2 30 05/25/2022 0926   GLUCOSE 121 (H) 05/25/2022 0926   BUN 18 05/25/2022 0926   BUN 19 03/03/2021 1501   CREATININE 0.91 05/25/2022 0926   CALCIUM 9.3 05/25/2022 0926   GFRNONAA >90  07/21/2012 1845   GFRAA >90 07/21/2012 1845   Lab Results  Component Value Date   HGBA1C 7.7 (H) 05/25/2022   HGBA1C 7.1 (H) 04/24/2006   No results found for: "INSULIN" CBC    Component Value Date/Time   WBC 8.4 10/13/2021 0828   RBC 4.30 10/13/2021 0828   HGB 13.6 10/13/2021 0828   HCT 39.3 10/13/2021 0828   PLT 244.0 10/13/2021 0828   MCV 91.4 10/13/2021 0828   MCH 32.1 07/21/2012 1845   MCHC 34.5 10/13/2021 0828   RDW 12.7 10/13/2021 0828   Iron/TIBC/Ferritin/ %Sat No results found for: "IRON", "TIBC", "FERRITIN", "IRONPCTSAT" Lipid Panel     Component Value Date/Time   CHOL 100 05/25/2022 0926   TRIG 66.0 05/25/2022 0926   HDL 40.70 05/25/2022 0926    CHOLHDL 2 05/25/2022 0926   VLDL 13.2 05/25/2022 0926   LDLCALC 46 05/25/2022 0926   Hepatic Function Panel     Component Value Date/Time   PROT 6.8 05/26/2019 0956   ALBUMIN 4.2 05/26/2019 0956   AST 15 05/25/2022 0926   ALT 15 05/25/2022 0926   ALKPHOS 54 05/26/2019 0956   BILITOT 0.7 05/26/2019 0956   BILIDIR 0.1 03/24/2010 1210      Component Value Date/Time   TSH 1.00 04/14/2021 0925     Assessment and Plan:   Type 2 diabetes mellitus with other circulatory complication, without Ruberg-term current use of insulin (HCC) Continue to follow-up with PCP.  Continue medications as directed.  Essential hypertension Continue follow-up with PCP and cardiology.  Continue medications as directed.  Hyperlipidemia, unspecified hyperlipidemia type Continue follow-up with PCP and cardiology.  Continue medications as directed.  OSA (obstructive sleep apnea) Continue CPAP nightly.  Morbid obesity (HCC)  BMI 33.0-33.9,adult        Obesity Treatment / Action Plan:  Patient will work on garnering support from family and friends to begin weight loss journey. Will work on eliminating or reducing the presence of highly palatable, calorie dense foods in the home. Will complete provided nutritional and psychosocial assessment questionnaire before the next appointment. Will be scheduled for indirect calorimetry to determine resting energy expenditure in a fasting state.  This will allow [place] to create a reduced calorie, high-protein meal plan to promote loss of fat mass while preserving muscle mass. Counseled on the health benefits of losing 5%-15% of total body weight. Was counseled on nutritional approaches to weight loss and benefits of reducing processed foods and consuming plant-based foods and high quality protein as part of nutritional weight management. Was counseled on pharmacotherapy and role as an adjunct in weight management.   Obesity Education Performed Today:  He was  weighed on the bioimpedance scale and results were discussed and documented in the synopsis.  We discussed obesity as a disease and the importance of a more detailed evaluation of all the factors contributing to the disease.  We discussed the importance of [place] term lifestyle changes which include nutrition, exercise and behavioral modifications as well as the importance of customizing this to his specific health and social needs.  We discussed the benefits of reaching a healthier weight to alleviate the symptoms of existing conditions and reduce the risks of the biomechanical, metabolic and psychological effects of obesity.  Anselm Pancoast Zartman appears to be in the action stage of change and states they are ready to start intensive lifestyle modifications and behavioral modifications.  30 minutes was spent today on this visit including the above counseling, pre-visit chart review, and  post-visit documentation.  Reviewed by clinician on day of visit: allergies, medications, problem list, medical history, surgical history, family history, social history, and previous encounter notes pertinent to obesity diagnosis.    Theodis Sato Izora Benn FNP-C

## 2022-08-07 ENCOUNTER — Other Ambulatory Visit: Payer: Self-pay | Admitting: Internal Medicine

## 2022-08-16 ENCOUNTER — Ambulatory Visit (INDEPENDENT_AMBULATORY_CARE_PROVIDER_SITE_OTHER): Payer: 59 | Admitting: Bariatrics

## 2022-08-16 ENCOUNTER — Encounter: Payer: Self-pay | Admitting: Bariatrics

## 2022-08-16 VITALS — BP 121/77 | HR 76 | Temp 98.2°F | Ht 75.0 in | Wt 262.0 lb

## 2022-08-16 DIAGNOSIS — Z6832 Body mass index (BMI) 32.0-32.9, adult: Secondary | ICD-10-CM

## 2022-08-16 DIAGNOSIS — Z0000 Encounter for general adult medical examination without abnormal findings: Secondary | ICD-10-CM

## 2022-08-16 DIAGNOSIS — R0602 Shortness of breath: Secondary | ICD-10-CM

## 2022-08-16 DIAGNOSIS — I10 Essential (primary) hypertension: Secondary | ICD-10-CM

## 2022-08-16 DIAGNOSIS — E559 Vitamin D deficiency, unspecified: Secondary | ICD-10-CM

## 2022-08-16 DIAGNOSIS — R5383 Other fatigue: Secondary | ICD-10-CM | POA: Diagnosis not present

## 2022-08-16 DIAGNOSIS — E538 Deficiency of other specified B group vitamins: Secondary | ICD-10-CM

## 2022-08-16 DIAGNOSIS — Z7985 Long-term (current) use of injectable non-insulin antidiabetic drugs: Secondary | ICD-10-CM

## 2022-08-16 DIAGNOSIS — E1159 Type 2 diabetes mellitus with other circulatory complications: Secondary | ICD-10-CM | POA: Diagnosis not present

## 2022-08-16 DIAGNOSIS — Z1331 Encounter for screening for depression: Secondary | ICD-10-CM | POA: Diagnosis not present

## 2022-08-16 DIAGNOSIS — Z7984 Long term (current) use of oral hypoglycemic drugs: Secondary | ICD-10-CM

## 2022-08-16 DIAGNOSIS — E669 Obesity, unspecified: Secondary | ICD-10-CM

## 2022-08-18 LAB — TSH+T4F+T3FREE
Free T4: 1.52 ng/dL (ref 0.82–1.77)
T3, Free: 3.5 pg/mL (ref 2.0–4.4)
TSH: 0.701 u[IU]/mL (ref 0.450–4.500)

## 2022-08-18 LAB — INSULIN, RANDOM: INSULIN: 10.3 u[IU]/mL (ref 2.6–24.9)

## 2022-08-18 LAB — VITAMIN D 25 HYDROXY (VIT D DEFICIENCY, FRACTURES): Vit D, 25-Hydroxy: 46.7 ng/mL (ref 30.0–100.0)

## 2022-08-18 LAB — VITAMIN B12: Vitamin B-12: 350 pg/mL (ref 232–1245)

## 2022-08-20 NOTE — Progress Notes (Unsigned)
Chief Complaint:   OBESITY Anthony [name] (MR# [id number]) is a 67 y.o. male who presents for evaluation and treatment of obesity and related comorbidities. Current BMI is Body mass index is 32.75 kg/m. Anthony [name] has been struggling with his weight for many years and has been unsuccessful in either losing weight, maintaining weight loss, or reaching his healthy weight goal.  Anthony [name] is currently in the action stage of change and ready to dedicate time achieving and maintaining a healthier weight. Anthony [name] is interested in becoming our patient and working on intensive lifestyle modifications including (but not limited to) diet and exercise for weight loss.  Patient is here for his initial visit. He was seen by Anthony Cornfield, NP on 07/26/2022 for an information visit.   Anthony [name]'s habits were reviewed today and are as follows: {MWM WT HABITS:23461}.  Depression Screen Anthony [name]'s Food and Mood (modified PHQ-9) score was ***.  Subjective:   1. Other fatigue Anthony [name] {Actions; denies/reports/admits to:19208} daytime somnolence and {Actions; denies/reports/admits to:19208} waking up still tired. Patient has a history of symptoms of {OSA Sx:17850}. Anthony [name] generally gets {numbers (fuzzy):14653} hours of sleep per night, and states that he has {sleep quality:17851}. Snoring {is/are not:32546} present. Apneic episodes {is/are not:32546} present. Epworth Sleepiness Score is ***.   2. SOB (shortness of breath) Anthony [name] notes increasing shortness of breath with exercising and seems to be worsening over time with weight gain. He notes getting out of breath sooner with activity than he used to. This has not gotten worse recently. Anthony [name] denies shortness of breath at rest or orthopnea.  3. Type 2 diabetes mellitus with other circulatory complication, without Anthony current use of insulin (HCC) ***  4. Essential hypertension ***  5. Healthcare maintenance Given obesity.   6. Vitamin D deficiency ***  7. B12  deficiency ***  Assessment/Plan:   1. Other fatigue Anthony [name] does feel that his weight is causing his energy to be lower than it should be. Fatigue may be related to obesity, depression or many other causes. Labs will be ordered, and in the meanwhile, Anthony [name] will focus on self care including making healthy food choices, increasing physical activity and focusing on stress reduction.  2. SOB (shortness of breath) Anthony [name] does feel that he gets out of breath more easily that he used to when he exercises. Anthony [name]'s shortness of breath appears to be obesity related and exercise induced. He has agreed to work on weight loss and gradually increase exercise to treat his exercise induced shortness of breath. Will continue to monitor closely.  - TSH+T4F+T3Free  3. Type 2 diabetes mellitus with other circulatory complication, without Anthony [name]-term current use of insulin (HCC) *** - Insulin, random  4. Essential hypertension ***  5. Healthcare maintenance We will check labs today. IC was done today, and we will follow-up at his next visit.   - Insulin, random - TSH+T4F+T3Free - Vitamin B12 - VITAMIN D 25 Hydroxy (Vit-D Deficiency, Fractures)  6. Vitamin D deficiency *** - VITAMIN D 25 Hydroxy (Vit-D Deficiency, Fractures)  7. B12 deficiency *** - Vitamin B12  8. Depression screen Anthony [name] had a positive depression screening. Depression is commonly associated with obesity and often results in emotional eating behaviors. We will monitor this closely and work on CBT to help improve the non-hunger eating patterns. Referral to Psychology may be required if no improvement is seen as he continues in our clinic.  9. Generalized obesity  10. BMI 32.0-32.9,adult Anthony [name] is currently in the action stage of change and  his goal is to continue with weight loss efforts. I recommend Anthony [name] begin the structured treatment plan as follows:  He has agreed to the Category 4 Plan + 200 calories. Decrease skipping meals.  Meal  planning. Reviewed labs with the patient from 05/25/2022, CMP, lipids, A1c, glucose, and PSA.   Exercise goals: No exercise has been prescribed at this time.   Behavioral modification strategies: increasing lean protein intake, decreasing simple carbohydrates, increasing vegetables, increasing water intake, decreasing eating out, no skipping meals, meal planning and cooking strategies, keeping healthy foods in the home, and planning for success.  He was informed of the importance of frequent follow-up visits to maximize his success with intensive lifestyle modifications for his multiple health conditions. He was informed we would discuss his lab results at his next visit unless there is a critical issue that needs to be addressed sooner. Anthony [name] agreed to keep his next visit at the agreed upon time to discuss these results.  Objective:   Blood pressure 121/77, pulse 76, temperature 98.2 F (36.8 C), height 6\' 3"  (1.905 m), weight 262 lb (118.8 kg), SpO2 97 %. Body mass index is 32.75 kg/m.  EKG: Normal sinus rhythm, rate 71 BPM.  Indirect Calorimeter completed today shows a VO2 of 474 and a REE of 3249.  His calculated basal metabolic rate is 0981 thus his basal metabolic rate is better than expected.  General: Cooperative, alert, well developed, in no acute distress. HEENT: Conjunctivae and lids unremarkable. Cardiovascular: Regular rhythm.  Lungs: Normal work of breathing. Neurologic: No focal deficits.   Lab Results  Component Value Date   CREATININE 0.91 05/25/2022   BUN 18 05/25/2022   NA 139 05/25/2022   K 4.2 05/25/2022   CL 102 05/25/2022   CO2 30 05/25/2022   Lab Results  Component Value Date   ALT 15 05/25/2022   AST 15 05/25/2022   ALKPHOS 54 05/26/2019   BILITOT 0.7 05/26/2019   Lab Results  Component Value Date   HGBA1C 7.7 (H) 05/25/2022   HGBA1C 7.4 (H) 02/23/2022   HGBA1C 8.6 (H) 10/13/2021   HGBA1C 7.3 (H) 04/14/2021   HGBA1C 7.7 (H) 12/16/2020   Lab  Results  Component Value Date   INSULIN 10.3 08/16/2022   Lab Results  Component Value Date   TSH 0.701 08/16/2022   Lab Results  Component Value Date   CHOL 100 05/25/2022   HDL 40.70 05/25/2022   LDLCALC 46 05/25/2022   TRIG 66.0 05/25/2022   CHOLHDL 2 05/25/2022   Lab Results  Component Value Date   WBC 8.4 10/13/2021   HGB 13.6 10/13/2021   HCT 39.3 10/13/2021   MCV 91.4 10/13/2021   PLT 244.0 10/13/2021   No results found for: "IRON", "TIBC", "FERRITIN"  Attestation Statements:   Reviewed by clinician on day of visit: allergies, medications, problem list, medical history, surgical history, family history, social history, and previous encounter notes.   Trude Mcburney, am acting as Energy manager for [employer], DO.  I have reviewed the above documentation for accuracy and completeness, and I agree with the above. - ***

## 2022-08-23 ENCOUNTER — Encounter: Payer: Self-pay | Admitting: Bariatrics

## 2022-08-31 ENCOUNTER — Ambulatory Visit
Admission: RE | Admit: 2022-08-31 | Discharge: 2022-08-31 | Disposition: A | Discharge status: Home / Self Care | Payer: 59 | Source: Ambulatory Visit | Attending: Thoracic Surgery (Cardiothoracic Vascular Surgery) | Admitting: Thoracic Surgery (Cardiothoracic Vascular Surgery)

## 2022-08-31 DIAGNOSIS — Q2543 Congenital aneurysm of aorta: Secondary | ICD-10-CM

## 2022-08-31 MED ORDER — GADOPICLENOL 0.5 MMOL/ML IV SOLN
10.0000 mL | Freq: Once | INTRAVENOUS | Status: AC | PRN
  Administered 2022-08-31: 10 mL via INTRAVENOUS

## 2022-09-03 ENCOUNTER — Ambulatory Visit: Payer: 59 | Admitting: Bariatrics

## 2022-09-03 ENCOUNTER — Other Ambulatory Visit (HOSPITAL_BASED_OUTPATIENT_CLINIC_OR_DEPARTMENT_OTHER): Payer: Self-pay

## 2022-09-03 ENCOUNTER — Encounter: Payer: Self-pay | Admitting: Bariatrics

## 2022-09-03 VITALS — BP 109/62 | HR 77 | Temp 98.1°F | Ht 75.0 in | Wt 267.0 lb

## 2022-09-03 DIAGNOSIS — I10 Essential (primary) hypertension: Secondary | ICD-10-CM | POA: Diagnosis not present

## 2022-09-03 DIAGNOSIS — E669 Obesity, unspecified: Secondary | ICD-10-CM

## 2022-09-03 DIAGNOSIS — E1159 Type 2 diabetes mellitus with other circulatory complications: Secondary | ICD-10-CM | POA: Diagnosis not present

## 2022-09-03 DIAGNOSIS — R600 Localized edema: Secondary | ICD-10-CM

## 2022-09-03 DIAGNOSIS — Z6833 Body mass index (BMI) 33.0-33.9, adult: Secondary | ICD-10-CM

## 2022-09-03 DIAGNOSIS — Z7985 Long-term (current) use of injectable non-insulin antidiabetic drugs: Secondary | ICD-10-CM

## 2022-09-03 DIAGNOSIS — Z7984 Long term (current) use of oral hypoglycemic drugs: Secondary | ICD-10-CM

## 2022-09-03 MED ORDER — TIRZEPATIDE 10 MG/0.5ML ~~LOC~~ SOAJ
10.0000 mg | SUBCUTANEOUS | 0 refills | Status: DC
  Filled 2022-09-03: qty 2, 28d supply, fill #0

## 2022-09-04 NOTE — Progress Notes (Unsigned)
Chief Complaint:   OBESITY Anthony [name] is here to discuss his progress with his obesity treatment plan along with follow-up of his obesity related diagnoses. Anthony [name] is on the Category 4 Plan + 200 calories and states he is following his eating plan approximately 50% of the time. Anthony [name] states he is doing 0 minutes 0 times per week.  Today's visit was #: 2 Starting weight: 262 lbs Starting date: 08/16/2022 Today's weight: 267 lbs Today's date: 09/03/2022 Total lbs lost to date: 0 Total lbs lost since last in-office visit: 0  Interim History: Patient is up 5 pounds since his last visit.  He states that he was not really listening at his last visit, but he stayed away from sausage biscuits.  He had an injection in his elbow, fluid retention.  Subjective:   1. Type 2 diabetes mellitus with other circulatory complication, without Anthony [name] (HCC) Patient is taking metformin, Ozempic, and Actos.  Has fasting glucose range between 150-170's; 2-hour postprandial 150-160's.  He notes that he has cut his carbohydrates.  2. Essential hypertension Patient is taking Cozaar and atenolol, and his blood pressure is controlled.  3. Fluid retention in legs Patient has some fluid retention in his legs.  Assessment/Plan:   1. Type 2 diabetes mellitus with other circulatory complication, without Anthony [name] (HCC) Patient agreed to start Mounjaro 10 mg once weekly with no refills.  He is to discontinue Ozempic.  - tirzepatide (MOUNJARO) 10 MG/0.5ML Pen; Inject 10 mg into the skin once a week.  Dispense: 2 mL; Refill: 0  2. Essential hypertension Patient will continue his medications as directed.  3. Fluid retention in legs Patient will work on eliminating added salt and increase his water intake.  He is to have no more than 1 small Gatorade.  He will continue to walk and bring his lunch with him to work.  He is to elevate his feet.  4. Generalized obesity  5.  BMI 33.0-33.9,adult Anthony [name] is currently in the action stage of change. As such, his goal is to continue with weight loss efforts. He has agreed to the Category 4 Plan.   Patient will adhere to the plan 80-90%.  Meal planning was discussed.  Review labs with the patient from 08/16/2022, vitamin D, B12, [name], and thyroid panel.  Patient is to pack his lunch.  Exercise goals: No exercise has been prescribed at this time.  Behavioral modification strategies: increasing lean protein intake, decreasing simple carbohydrates, increasing vegetables, increasing water intake, decreasing eating out, no skipping meals, meal planning and cooking strategies, keeping healthy foods in the home, and planning for success.  Anthony [name] has agreed to follow-up with our clinic in 2 weeks. He was informed of the importance of frequent follow-up visits to maximize his success with intensive lifestyle modifications for his multiple health conditions.   Objective:   Blood pressure 109/62, pulse 77, temperature 98.1 F (36.7 C), height 6\' 3"  (1.905 m), weight 267 lb (121.1 kg), SpO2 97 %. Body mass index is 33.37 kg/m.  General: Cooperative, alert, well developed, in no acute distress. HEENT: Conjunctivae and lids unremarkable. Cardiovascular: Regular rhythm.  Lungs: Normal work of breathing. Neurologic: No focal deficits.   Lab Results  Component Value Date   CREATININE 0.91 05/25/2022   BUN 18 05/25/2022   NA 139 05/25/2022   K 4.2 05/25/2022   CL 102 05/25/2022   CO2 30 05/25/2022   Lab Results  Component Value Date  ALT 15 05/25/2022   AST 15 05/25/2022   ALKPHOS 54 05/26/2019   BILITOT 0.7 05/26/2019   Lab Results  Component Value Date   HGBA1C 7.7 (H) 05/25/2022   HGBA1C 7.4 (H) 02/23/2022   HGBA1C 8.6 (H) 10/13/2021   HGBA1C 7.3 (H) 04/14/2021   HGBA1C 7.7 (H) 12/16/2020   Lab Results  Component Value Date   [name] 10.3 08/16/2022   Lab Results  Component Value Date   TSH 0.701 08/16/2022    Lab Results  Component Value Date   CHOL 100 05/25/2022   HDL 40.70 05/25/2022   LDLCALC 46 05/25/2022   TRIG 66.0 05/25/2022   CHOLHDL 2 05/25/2022   Lab Results  Component Value Date   VD25OH 46.7 08/16/2022   VD25OH 44 07/31/2011   Lab Results  Component Value Date   WBC 8.4 10/13/2021   HGB 13.6 10/13/2021   HCT 39.3 10/13/2021   MCV 91.4 10/13/2021   PLT 244.0 10/13/2021   No results found for: "IRON", "TIBC", "FERRITIN"  Attestation Statements:   Reviewed by clinician on day of visit: allergies, medications, problem list, medical history, surgical history, family history, social history, and previous encounter notes.   Trude Mcburney, am acting as Energy manager for [employer], DO.  I have reviewed the above documentation for accuracy and completeness, and I agree with the above. Corinna Capra, DO

## 2022-09-05 ENCOUNTER — Encounter: Payer: Self-pay | Admitting: Bariatrics

## 2022-09-11 ENCOUNTER — Ambulatory Visit: Payer: 59 | Admitting: Thoracic Surgery (Cardiothoracic Vascular Surgery)

## 2022-09-11 VITALS — BP 112/71 | HR 69 | Resp 20 | Ht 75.0 in | Wt 265.0 lb

## 2022-09-11 DIAGNOSIS — Q2543 Congenital aneurysm of aorta: Secondary | ICD-10-CM

## 2022-09-11 DIAGNOSIS — I7121 Aneurysm of the ascending aorta, without rupture: Secondary | ICD-10-CM

## 2022-09-11 NOTE — Progress Notes (Signed)
[address]       Anthony [name] [address]             [phone]     HPI: Anthony [name] returns for follow-up of his sinus aneurysm.  Anthony [name] is a 67 year old man with a history of hypertension, hyperlipidemia, type 2 diabetes, reflux, sleep apnea, and a sinus of Valsalva aneurysm.  He was first noted to have a dilated aortic root on echocardiogram in 2014 on echocardiogram.  Continues to work as a [employer].  No chest pain, pressure, tightness, or shortness of breath.  Has been working with a physician and obesity and diabetes management.  Last hemoglobin A1c was 7.7.  Overall feels well.  Quit smoking over 40 years ago.  Past Medical History:  Diagnosis Date   Aortic aneurysm (HCC)    Aortic root enlargement (HCC)    CT 42 mm 2014   Chronic back pain    in 2022 pt states "it ended [place] ago"   Diabetes mellitus    type II   DJD (degenerative joint disease)    per MRI of LS spine 2005   GERD (gastroesophageal reflux disease)    Hyperlipidemia    Hypertension    Hypogonadism male    dx w/ hypogonadism elsewhere ~ 2011   Normal cardiac stress test    (-) stress test 2003, 2006   S/P cardiac cath     04-2006 neg per pt--CP improved w/ PPIs   Sleep apnea    uses cpap   Tachycardia    ER 07-2012, ?SCR, on atenolol    Current Outpatient Medications  Medication Sig Dispense Refill   aspirin 81 MG tablet Take 81 mg by mouth daily.     atenolol (TENORMIN) 50 MG tablet TAKE 1/2 TABLET BY MOUTH DAILY 45 tablet 1   fluticasone (FLONASE) 50 MCG/ACT nasal spray Place 1 spray into both nostrils daily.     hydrochlorothiazide (HYDRODIURIL) 25 MG tablet Take 1 tablet (25 mg total) by mouth daily. 90 tablet 1   Insulin Pen Needle (PEN NEEDLES) 32G X 6 MM MISC To use w/ Victoza 100 each 3   ipratropium (ATROVENT) 0.06 % nasal spray Place 2 sprays into both nostrils 3 (three) times daily.     losartan (COZAAR) 25 MG tablet Take 1 tablet (25 mg total) by mouth daily. 90  tablet 1   metFORMIN (GLUCOPHAGE) 850 MG tablet TAKE 2 TABLETS BY MOUTH DAILY WITH BREAKFAST AND 1 TABLET DAILY WITH SUPPER. 270 tablet 1   ONETOUCH ULTRA test strip CHECK BLOOD SUGAR NO MORE THAN TWICE DAILY. DX IS E11.9. MAX ON INSURANCE 100 strip 12   pioglitazone (ACTOS) 45 MG tablet Take 1 tablet (45 mg total) by mouth daily. 90 tablet 1   rosuvastatin (CRESTOR) 20 MG tablet Take 1 tablet (20 mg total) by mouth daily. 90 tablet 1   tirzepatide (MOUNJARO) 10 MG/0.5ML Pen Inject 10 mg into the skin once a week. 2 mL 0   tiZANidine (ZANAFLEX) 4 MG tablet TAKE 1 TABLET BY MOUTH AT BEDTIME AS NEEDED FOR MUSCLE SPASMS. 90 tablet 1   amoxicillin-clavulanate (AUGMENTIN) 875-125 MG tablet Take 1 tablet by mouth 2 (two) times daily. (Patient not taking: Reported on 09/11/2022)     benzonatate (TESSALON) 200 MG capsule Take 200 mg by mouth as needed for cough. (Patient not taking: Reported on 08/16/2022)     No current facility-administered medications for this visit.    Physical  Exam BP 112/71   Pulse 69   Resp 20   Ht 6\' 3"  (1.905 m)   Wt 265 lb (120.2 kg)   SpO2 94% Comment: RA  BMI 33.25 kg/m  67 year old man in no acute distress Alert and oriented x 3 with no focal deficits Lungs clear with equal breath sounds bilaterally Cardiac regular rate and rhythm with normal S1 and S2 No peripheral edema  Diagnostic Tests: MRA CHEST WITH OR WITHOUT CONTRAST   TECHNIQUE: Angiographic images of the chest were obtained using MRA technique with and without intravenous contrast.   CONTRAST:  10 mL gadopiclenol   COMPARISON:  Chest CTA 02/16/2022   FINDINGS: VASCULAR   Aorta: Technically challenging examination with significant motion artifact. The aortic root at the sinuses of Valsalva measures approximately 4.5 cm and minimally changed from the previous CTA. Previous CTA was better characterization of the aortic root and ascending thoracic aorta. No significant enlargement of  the ascending thoracic aorta, aortic arch and descending thoracic aorta. Typical three-vessel arch anatomy. No evidence for an aortic dissection.   Heart: Heart size is normal.   Pulmonary Arteries:  Normal caliber of the main pulmonary arteries.   Other: Internal jugular, innominate veins and SVC are patent.   NON-VASCULAR   No gross abnormality in the upper abdomen. No gross abnormality involving the mediastinum. No large pleural effusions.   IMPRESSION: 1. Technically challenging examination with poor visualization of the aortic root. However, the aortic root appears to be grossly stable in size compared to the exam on 02/16/2022.     Electronically Signed   By: Richarda Overlie M.D.   On: 08/31/2022 15:5 I personally reviewed the MR images.  Lots of motion artifact so difficult to get a precise measurement.  I measured about 4.7 cm which is consistent with his previous CT.  Impression: Anthony [name] is a 67 year old man with a history of hypertension, hyperlipidemia, type 2 diabetes, reflux, sleep apnea, and a sinus of Valsalva aneurysm.   Sinus of Valsalva aneurysm-stable at 4.7 cm.  No indication for surgery.  Will plan to repeat an MR angio in 6 months.  Will do with cardiac gating to see if we can get better images.  Hypertension-blood pressure well-controlled.  Hyperlipidemia-on rosuvastatin.  Type 2 diabetes-most recent hemoglobin A1c 7.7.  Working on diet and diabetes management with Dr. Manson Passey.  Plan: Return in 6 months with MR angio chest  Loreli Slot, MD [facility] [phone]

## 2022-09-24 ENCOUNTER — Ambulatory Visit: Payer: 59 | Admitting: Bariatrics

## 2022-10-06 IMAGING — MR MR MRA CHEST W/ OR W/O CM
12 series · 16 of 16 positions shown · IV contrast (Multihance)
Comparison: 08/21/2019, 08/08/2018, 12/20/2017,, most recent CT
[REDACTED], baseline CT [REDACTED]

CLINICAL DATA: 64-year-old male with a history of aortic disease

EXAM:
MRA CHEST WITH OR WITHOUT CONTRAST
TECHNIQUE: Angiographic images of the chest were obtained using MRA technique
without and with intravenous contrast.
CONTRAST:  20mL MULTIHANCE GADOBENATE DIMEGLUMINE 529 MG/ML IV SOLN

[Series 2: bSSFP · axial · 6.0mm · 1.56mm/px · 1 of 42 slices shown (1 of 2)]
[im 1/42]
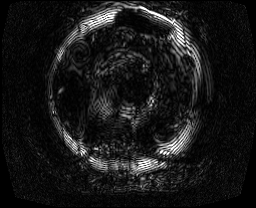

[Series 3: T2 · axial · 5.0mm · 1.56mm/px · 1 of 58 slices shown]
[im 1/58]
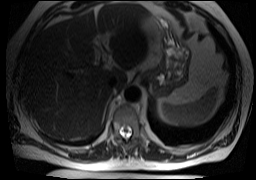

[Series 4: T1 · axial · 5.0mm · 1.56mm/px · 1 of 60 slices shown]
[im 1/60]
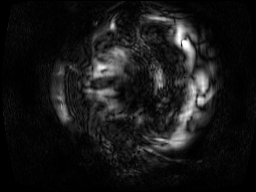

[Series 5: T1 dynamic · axial · non-contrast · 2.5mm · 0.74mm/px · 1 of 128 slices shown]
[im 1/128]
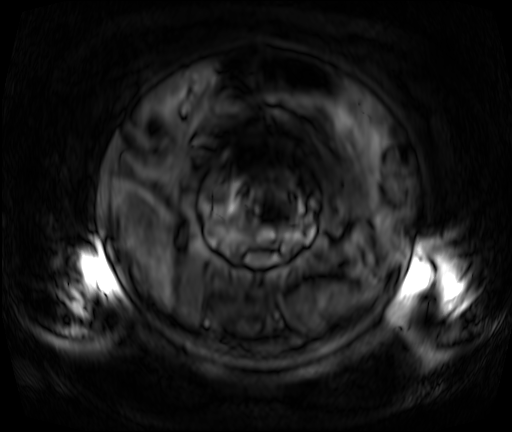

[Series 6: bSSFP · sagittal · 4.0mm · 1.64mm/px · 1 of 22 slices shown (2 of 2)]
[im 1/22]
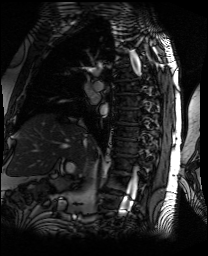

[Series 8: fl3d_cor_pre_tt=1.0s · sagittal · 1.5mm · 1.15mm/px · 2 of 104 slices shown]
[im 1/104]
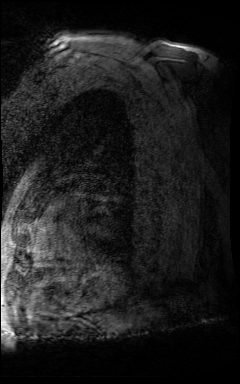
[im 104/104]
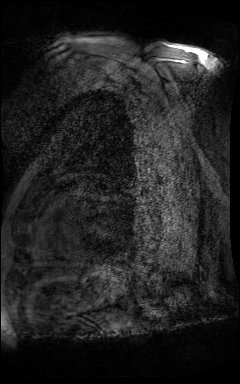

[Series 10: fl3d_cor_post_tt=1.0s · sagittal · 1.5mm · 1.15mm/px · 2 of 104 slices shown]
[im 1/104]
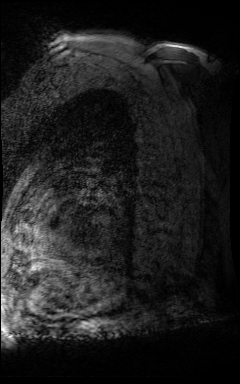
[im 104/104]
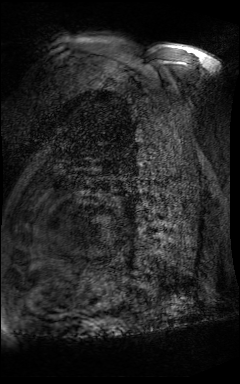

[Series 14: T1 fat-sat post-contrast · axial · 5.0mm · 1.48mm/px · 1 of 26 slices shown]
[im 1/26]
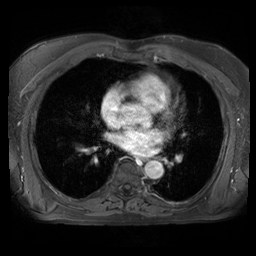

[Series 15: post flash candy · sagittal · 4.0mm · 1.64mm/px · 1 of 26 slices shown]
[im 1/26]
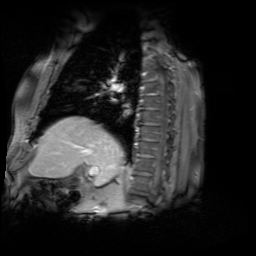

[Series 16: T1 dynamic post-contrast · axial · 2.5mm · 0.74mm/px · z∈[-175,+142]mm · 2 of 128 slices shown]
[im 1/128]
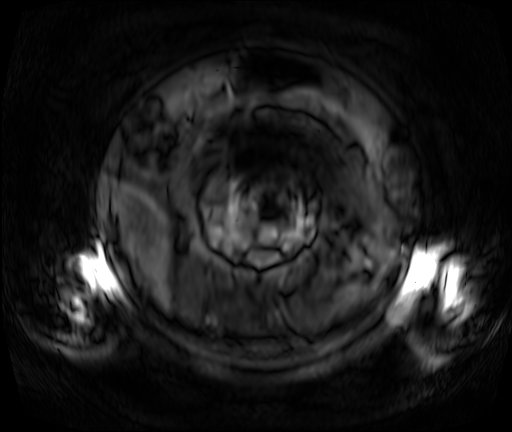
[im 128/128]
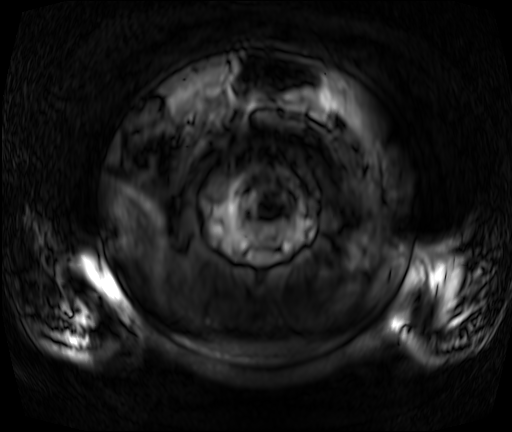

[Series 17: post vibe_sub · axial · 2.5mm · 0.74mm/px · z∈[-175,+142]mm · 2 of 128 slices shown]
[im 1/128]
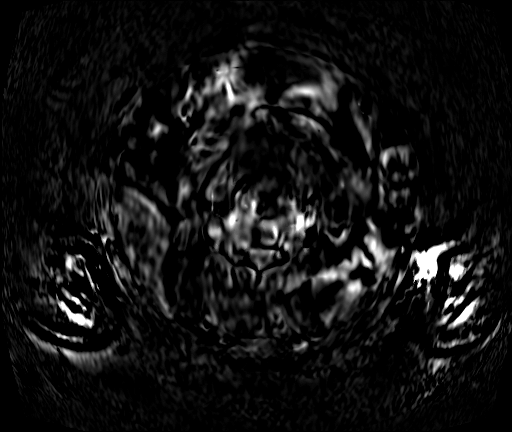
[im 128/128]
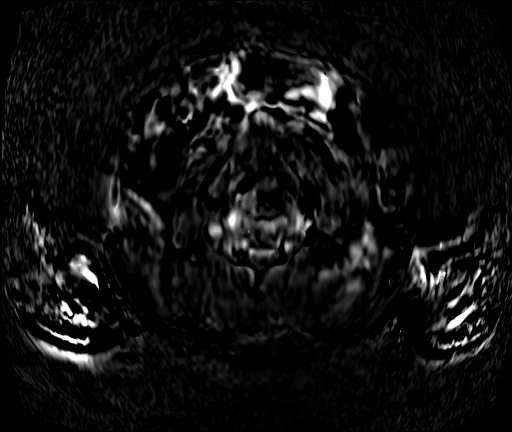

[Series 18: MRA · coronal · 1.15mm/px · 1 of 6 slices shown]
[im 1/6]
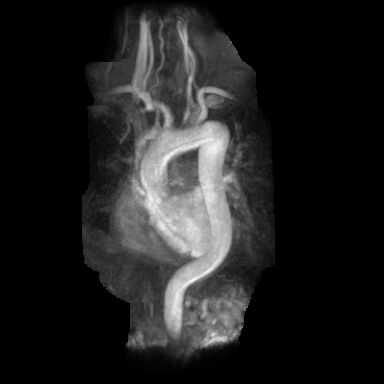

[16 of 16 positions shown; findings below may reference images not displayed]

FINDINGS: VASCULAR

Aorta: No significant calcifications are pre shaded at the aortic
valve given the absence of susceptibility at this site. The current
MR resolution is below that which would allow an accurate assessment
of the annulus for the Samuelson junction. The recent MR scans
dated 11/23/2016, 08/19/2012, 08/21/2019, 08/08/2018 are below the
spatial resolution for a reasonable measurement of the Samuelson
junction and annulus.

My best estimate of the aortic annulus on the MR dated 12/20/2017 is
29 mm.

By my measurements on the prior CT dated 06/21/2017, the annulus
measures 29 mm-56 mm on the coronal images, and the Samuelson
junction measures 35 mm on the coronal images, although the study is
not gated.

The greatest estimated diameter of the ascending aorta tubular
segment on today's MRI measures 33 mm which is essentially unchanged
over time.

Three vessel aortic arch.  Branch vessels are patent.

Tortuosity again demonstrated of the distal thoracic aorta. No
evidence of dissection, ulcerated plaque, pedunculated plaque, or
significant calcified atherosclerotic changes.

Heart: Borderline cardiomegaly is unchanged. No significant
pericardial fluid.

Pulmonary Arteries: Main pulmonary artery measures 3 cm. Flow signal
unremarkable.

Other: None

NON-VASCULAR

No mediastinal adenopathy.

Unremarkable signal of the visualized thoracic esophagus.

Visualized chest wall unremarkable.

Signals within the lungs unremarkable.

Uniform signal of the vertebral elements.

Unremarkable appearance of the upper abdomen.
IMPRESSION: The MR is negative for ascending aortic aneurysm, with a maximum
diameter of the ascending aorta estimated 3.3 cm, essentially
unchanged from the baseline CT dated 06/21/2017.

The spatial resolution of the current MRI/MRA and most recent
previously performed chest MRI/MRA are below that which would allow
an accurate measurement of the aortic annulus. My best estimate of
the diameter of the aortic annulus is based on the CT dated
11/23/2016, approximately 29 mm-56 mm. If there is concern for
aortic insufficiency/regurgitation, correlation with ECHO may be
useful if not already performed. Otherwise, it seems that the
previous maximum measurements provided on comparison studies were
not of the annulus, Samuelson junction, or ascending aorta, but
instead the sinuses of Valsalva. If in updated accurate diameter
assessment of the composite aortic root is necessary, consider
future gated CT angiogram.

## 2022-10-09 ENCOUNTER — Ambulatory Visit: Payer: 59 | Admitting: Bariatrics

## 2022-10-09 ENCOUNTER — Other Ambulatory Visit (HOSPITAL_BASED_OUTPATIENT_CLINIC_OR_DEPARTMENT_OTHER): Payer: Self-pay

## 2022-10-09 ENCOUNTER — Encounter: Payer: Self-pay | Admitting: Bariatrics

## 2022-10-09 VITALS — BP 113/71 | HR 73 | Temp 97.6°F | Ht 75.0 in | Wt 256.0 lb

## 2022-10-09 DIAGNOSIS — E1159 Type 2 diabetes mellitus with other circulatory complications: Secondary | ICD-10-CM

## 2022-10-09 DIAGNOSIS — Z6832 Body mass index (BMI) 32.0-32.9, adult: Secondary | ICD-10-CM

## 2022-10-09 DIAGNOSIS — Z7985 Long-term (current) use of injectable non-insulin antidiabetic drugs: Secondary | ICD-10-CM

## 2022-10-09 DIAGNOSIS — G4733 Obstructive sleep apnea (adult) (pediatric): Secondary | ICD-10-CM | POA: Diagnosis not present

## 2022-10-09 DIAGNOSIS — E669 Obesity, unspecified: Secondary | ICD-10-CM | POA: Diagnosis not present

## 2022-10-09 MED ORDER — TIRZEPATIDE 10 MG/0.5ML ~~LOC~~ SOAJ
10.0000 mg | SUBCUTANEOUS | 0 refills | Status: DC
  Filled 2022-10-09: qty 2, 28d supply, fill #0

## 2022-10-09 NOTE — Progress Notes (Signed)
WEIGHT SUMMARY AND BIOMETRICS  Weight Lost Since Last Visit: 11lb   Vitals Temp: 97.6 F (36.4 C) BP: 113/71 Pulse Rate: 73 SpO2: 98 %   Anthropometric Measurements Height: 6\' 3"  (1.905 m) Weight: 256 lb (116.1 kg) BMI (Calculated): 32 Weight at Last Visit: 267lb Weight Lost Since Last Visit: 11lb Starting Weight: 262lb Total Weight Loss (lbs): 6 lb (2.722 kg)   Body Composition  Body Fat %: 32.1 % Fat Mass (lbs): 82.2 lbs Muscle Mass (lbs): 165.4 lbs Total Body Water (lbs): 120.8 lbs Visceral Fat Rating : 18   Other Clinical Data Fasting: no Labs: no Today's Visit #: 3 Starting Date: 08/16/22    OBESITY Anthony [name] is here to discuss his progress with his obesity treatment plan along with follow-up of his obesity related diagnoses.     Nutrition Plan: the Category 4 plan - 80% adherence.  Current exercise: walking  Interim History:  He is down 11 lbs since his last visit.  Not eating all of the food on the plan., Protein intake is as prescribed, and Denies polyphagia  Pharmacotherapy: Daunte is on Mounjaro 10 mg SQ weekly and Metformin 850 mg 2 in the am and 1 in the pm, and Actos.  Adverse side effects: None Hunger is moderately controlled.  Cravings are moderately controlled.  Assessment/Plan:   Type II Diabetes HgbA1c is not at goal. Last A1c was 7.7 CBGs: Fasting 120 to 140        Episodes of hypoglycemia: no Medication(s): Mounjaro 10 mg SQ weekly  Lab Results  Component Value Date   HGBA1C 7.7 (H) 05/25/2022   HGBA1C 7.4 (H) 02/23/2022   HGBA1C 8.6 (H) 10/13/2021   Lab Results  Component Value Date   MICROALBUR 1.3 02/23/2022   LDLCALC 46 05/25/2022   CREATININE 0.91 05/25/2022   Lab Results  Component Value Date   GFR 88.95 05/25/2022   GFR 90.83 10/13/2021   GFR 93.19 08/12/2020    Plan: Continue and refill Mounjaro 10 mg SQ weekly Continue all other medications.  Will keep all carbohydrates low both sweets and  starches.  Will continue exercise regimen to 30 to 60 minutes on most days of the week.  Aim for 7 to 9 hours of sleep nightly.  Eat more low glycemic index foods.    Obstructive Sleep Apnea [name] has a diagnosis of sleep apnea. He reports that he is using a CPAP regularly. Reports restful sleep. He sleeps between 5 to 6 hours.   Plan: Continue CPAP therapy. Continue to practice good sleep hygiene.  Will add in elements of an antiinflammatory diet and  add in elements of a Mediterranean diet.  Reduce intake of carbohydrates for weight loss.  He will increase his exercise. He will begin weights.      Generalized Obesity: Current BMI BMI (Calculated): 32   Pharmacotherapy Plan Continue and refill  Mounjaro 10 mg SQ weekly  [name] is currently in the action stage of change. As such, his goal is to continue with weight loss efforts.  He has agreed to the Category 4 plan.  Exercise goals: Older adults should determine their level of effort for physical activity relative to their level of fitness.   Behavioral modification strategies: increasing lean protein intake, decreasing simple carbohydrates , no meal skipping, meal planning , and keep healthy foods in the home.  [name] has agreed to follow-up with our clinic in 3 weeks.      Objective:   VITALS: Per patient if applicable,  see vitals. GENERAL: Alert and in no acute distress. CARDIOPULMONARY: No increased WOB. Speaking in clear sentences.  PSYCH: Pleasant and cooperative. Speech normal rate and rhythm. Affect is appropriate. Insight and judgement are appropriate. Attention is focused, linear, and appropriate.  NEURO: Oriented as arrived to appointment on time with no prompting.   Attestation Statements:    This was prepared with the assistance of Engineer, civil (consulting).  Occasional wrong-word or sound-a-like substitutions may have occurred due to the inherent limitations of voice recognition software.   Corinna Capra, DO

## 2022-10-30 ENCOUNTER — Encounter: Payer: Self-pay | Admitting: Internal Medicine

## 2022-10-30 ENCOUNTER — Telehealth: Payer: Self-pay | Admitting: Internal Medicine

## 2022-10-30 ENCOUNTER — Other Ambulatory Visit: Payer: Self-pay | Admitting: Nurse Practitioner

## 2022-10-30 ENCOUNTER — Encounter: Payer: Self-pay | Admitting: Bariatrics

## 2022-10-30 ENCOUNTER — Other Ambulatory Visit (HOSPITAL_BASED_OUTPATIENT_CLINIC_OR_DEPARTMENT_OTHER): Payer: Self-pay

## 2022-10-30 DIAGNOSIS — E1159 Type 2 diabetes mellitus with other circulatory complications: Secondary | ICD-10-CM

## 2022-10-30 MED ORDER — TIRZEPATIDE 10 MG/0.5ML ~~LOC~~ SOAJ
10.0000 mg | SUBCUTANEOUS | 0 refills | Status: DC
  Filled 2022-10-30: qty 2, 28d supply, fill #0

## 2022-10-30 NOTE — Telephone Encounter (Signed)
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 States he has done the labs with [place] before.

## 2022-10-30 NOTE — Telephone Encounter (Signed)
Please advise 

## 2022-10-31 NOTE — Telephone Encounter (Signed)
Order placed. Okay to schedule lab appt at his earliest convenience. Thank you.

## 2022-10-31 NOTE — Telephone Encounter (Signed)
Okay to get labs at his convenience. A1c, DX DM.

## 2022-11-02 ENCOUNTER — Other Ambulatory Visit (INDEPENDENT_AMBULATORY_CARE_PROVIDER_SITE_OTHER): Payer: 59

## 2022-11-02 DIAGNOSIS — E1159 Type 2 diabetes mellitus with other circulatory complications: Secondary | ICD-10-CM

## 2022-11-02 LAB — HEMOGLOBIN A1C: Hgb A1c MFr Bld: 6.4 % (ref 4.6–6.5)

## 2022-11-06 ENCOUNTER — Ambulatory Visit: Payer: 59 | Admitting: Pulmonary Disease

## 2022-11-13 ENCOUNTER — Other Ambulatory Visit (HOSPITAL_BASED_OUTPATIENT_CLINIC_OR_DEPARTMENT_OTHER): Payer: Self-pay

## 2022-11-13 ENCOUNTER — Encounter: Payer: Self-pay | Admitting: Bariatrics

## 2022-11-13 ENCOUNTER — Encounter: Payer: Self-pay | Admitting: Thoracic Surgery (Cardiothoracic Vascular Surgery)

## 2022-11-13 ENCOUNTER — Ambulatory Visit: Payer: 59 | Admitting: Bariatrics

## 2022-11-13 VITALS — BP 102/61 | HR 72 | Temp 97.6°F | Ht 75.0 in | Wt 252.0 lb

## 2022-11-13 DIAGNOSIS — E669 Obesity, unspecified: Secondary | ICD-10-CM | POA: Diagnosis not present

## 2022-11-13 DIAGNOSIS — I10 Essential (primary) hypertension: Secondary | ICD-10-CM | POA: Diagnosis not present

## 2022-11-13 DIAGNOSIS — E1159 Type 2 diabetes mellitus with other circulatory complications: Secondary | ICD-10-CM | POA: Diagnosis not present

## 2022-11-13 DIAGNOSIS — Z6831 Body mass index (BMI) 31.0-31.9, adult: Secondary | ICD-10-CM

## 2022-11-13 DIAGNOSIS — Z7985 Long-term (current) use of injectable non-insulin antidiabetic drugs: Secondary | ICD-10-CM

## 2022-11-13 MED ORDER — TIRZEPATIDE 10 MG/0.5ML ~~LOC~~ SOAJ
10.0000 mg | SUBCUTANEOUS | 0 refills | Status: DC
  Filled 2022-11-13 – 2022-11-29 (×2): qty 2, 28d supply, fill #0

## 2022-11-13 NOTE — Progress Notes (Signed)
WEIGHT SUMMARY AND BIOMETRICS  Weight Lost Since Last Visit: 4lb  Vitals Temp: 97.6 F (36.4 C) BP: 102/61 Pulse Rate: 72 SpO2: 98 %   Anthropometric Measurements Height: 6\' 3"  (1.905 m) Weight: 252 lb (114.3 kg) BMI (Calculated): 31.5 Weight at Last Visit: 256lb Weight Lost Since Last Visit: 4lb Starting Weight: 262lb Total Weight Loss (lbs): 10 lb (4.536 kg)   Body Composition  Body Fat %: 32.1 % Fat Mass (lbs): 80.8 lbs Muscle Mass (lbs): 162.8 lbs Total Body Water (lbs): 122.4 lbs Visceral Fat Rating : 18   Other Clinical Data Fasting: no Labs: no Today's Visit #: 4 Starting Date: 08/16/22    OBESITY Anthony [name] is here to discuss his progress with his obesity treatment plan along with follow-up of his obesity related diagnoses.   Nutrition Plan: the Category 4 plan - 50% adherence.  Current exercise: walking  Interim History:  He is down 4 lbs since his last  visit and doing well overall.  Eating all of the food on the plan., Protein intake is as prescribed, Is not skipping meals, Meeting calorie goals., Water intake is adequate., and Denies polyphagia  Pharmacotherapy: Anthony [name] is on Mounjaro 10 mg SQ weekly Adverse side effects: None Hunger is moderately controlled.  Cravings are moderately controlled.  Assessment/Plan:   1. Type 2 diabetes mellitus with other circulatory complication, without Anthony [name] current use of insulin (HCC) Type II Diabetes HgbA1c is at goal. Last A1c was 6.4 CBGs: Post prandial 120 to 140       Episodes of hypoglycemia: no Medication(s): Mounjaro 10 mg SQ weekly  Lab Results  Component Value Date   HGBA1C 6.4 11/02/2022   HGBA1C 7.7 (H) 05/25/2022   HGBA1C 7.4 (H) 02/23/2022   Lab Results  Component Value Date   MICROALBUR 1.3 02/23/2022   LDLCALC 46 05/25/2022   CREATININE 0.91 05/25/2022   Lab Results  Component Value Date   GFR 88.95 05/25/2022   GFR 90.83 10/13/2021   GFR 93.19 08/12/2020     Plan: Continue and refill Mounjaro 10 mg SQ weekly Continue all other medications.  Will keep all carbohydrates low both sweets and starches.  Will continue exercise regimen to 30 to 60 minutes on most days of the week.  Aim for 7 to 9 hours of sleep nightly.  Eat more low glycemic index foods.   Hypertension Hypertension well controlled.  Medication(s): Hydrochlorothiazide 25 mg daily   BP Readings from Last 3 Encounters:  11/13/22 102/61  10/09/22 113/71  09/11/22 112/71   Lab Results  Component Value Date   CREATININE 0.91 05/25/2022   CREATININE 0.90 02/16/2022   CREATININE 0.89 10/13/2021   Lab Results  Component Value Date   GFR 88.95 05/25/2022   GFR 90.83 10/13/2021   GFR 93.19 08/12/2020    Plan: Continue all antihypertensives at current dosages. No added salt. Will keep sodium content to 1,500 mg or less per day.     Generalized Obesity: Current BMI BMI (Calculated): 31.5   Pharmacotherapy Plan Continue and refill  Mounjaro 10 mg SQ weekly  Anthony [name] is currently in the action stage of change. As such, his goal is to continue with weight loss efforts.  He has agreed to the Category 4 plan.  Exercise goals: Older adults should determine their level of effort for physical activity relative to their level of fitness.   Behavioral modification strategies: increasing lean protein intake, increase water intake, better snacking choices, planning for success, keep healthy foods in the  home, and mindful eating.  Anthony [name] has agreed to follow-up with our clinic in 4 weeks.      Objective:   VITALS: Per patient if applicable, see vitals. GENERAL: Alert and in no acute distress. CARDIOPULMONARY: No increased WOB. Speaking in clear sentences.  PSYCH: Pleasant and cooperative. Speech normal rate and rhythm. Affect is appropriate. Insight and judgement are appropriate. Attention is focused, linear, and appropriate.  NEURO: Oriented as arrived to appointment on time  with no prompting.   Attestation Statements:   This was prepared with the assistance of Engineer, civil (consulting).  Occasional wrong-word or sound-a-like substitutions may have occurred due to the inherent limitations of voice recognition software.   Corinna Capra, DO

## 2022-11-14 ENCOUNTER — Encounter: Payer: Self-pay | Admitting: *Deleted

## 2022-11-24 ENCOUNTER — Other Ambulatory Visit: Payer: Self-pay | Admitting: Internal Medicine

## 2022-11-29 ENCOUNTER — Other Ambulatory Visit (HOSPITAL_BASED_OUTPATIENT_CLINIC_OR_DEPARTMENT_OTHER): Payer: Self-pay

## 2022-11-30 ENCOUNTER — Other Ambulatory Visit: Payer: Self-pay | Admitting: Internal Medicine

## 2022-12-07 ENCOUNTER — Ambulatory Visit: Payer: 59 | Admitting: Internal Medicine

## 2022-12-07 ENCOUNTER — Ambulatory Visit: Payer: 59 | Admitting: Pulmonary Disease

## 2022-12-07 ENCOUNTER — Other Ambulatory Visit: Payer: Self-pay | Admitting: Internal Medicine

## 2022-12-07 DIAGNOSIS — G4733 Obstructive sleep apnea (adult) (pediatric): Secondary | ICD-10-CM

## 2022-12-07 NOTE — Progress Notes (Signed)
Anthony [name]    [id number]    01-11-1956  Primary Care Physician:Paz, Nolon Rod, MD  Referring Physician: Wanda Plump, MD [address] [place],  [place] [address]  Chief complaint:   Patient with a history of obstructive sleep apnea  HPI:  History of severe obstructive sleep apnea Compliant with CPAP use Study was in 2013 showing severe obstructive sleep apnea  Continues to use CPAP nightly  Benefiting from CPAP  Wakes up feeling rested  Compliance shows excellent compliance with optimal control of his AHI of 0.6  Usually gets up daily about 4 AM Takes him a little bit of time to get going  He has hypertension, diabetes-well-controlled  Wakes up in the morning feeling well rested on most days Denies any significant mouth dryness  He follows his CPAP use with an app on his phone on a regular basis  Outpatient Encounter Medications as of 12/07/2022  Medication Sig   aspirin 81 MG tablet Take 81 mg by mouth daily.   atenolol (TENORMIN) 50 MG tablet Take 0.5 tablets (25 mg total) by mouth daily.   hydrochlorothiazide (HYDRODIURIL) 25 MG tablet Take 1 tablet (25 mg total) by mouth daily.   losartan (COZAAR) 25 MG tablet Take 1 tablet (25 mg total) by mouth daily.   metFORMIN (GLUCOPHAGE) 850 MG tablet TAKE 2 TABLETS BY MOUTH DAILY WITH BREAKFAST AND 1 TABLET DAILY WITH SUPPER.   ONETOUCH ULTRA test strip CHECK BLOOD SUGAR NO MORE THAN TWICE DAILY. DX IS E11.9. MAX ON INSURANCE   pioglitazone (ACTOS) 45 MG tablet Take 1 tablet (45 mg total) by mouth daily.   rosuvastatin (CRESTOR) 20 MG tablet Take 1 tablet (20 mg total) by mouth daily.   tirzepatide (MOUNJARO) 10 MG/0.5ML Pen Inject 10 mg into the skin once a week.   [DISCONTINUED] benzonatate (TESSALON) 200 MG capsule Take 200 mg by mouth as needed for cough. (Patient not taking: Reported on 12/07/2022)   [DISCONTINUED] fluticasone (FLONASE) 50 MCG/ACT nasal spray Place 1 spray into both nostrils daily.  (Patient not taking: Reported on 12/07/2022)   [DISCONTINUED] Insulin Pen Needle (PEN NEEDLES) 32G X 6 MM MISC To use w/ Victoza (Patient not taking: Reported on 12/07/2022)   [DISCONTINUED] ipratropium (ATROVENT) 0.06 % nasal spray Place 2 sprays into both nostrils 3 (three) times daily. (Patient not taking: Reported on 12/07/2022)   [DISCONTINUED] rosuvastatin (CRESTOR) 20 MG tablet Take 1 tablet (20 mg total) by mouth daily.   [DISCONTINUED] tiZANidine (ZANAFLEX) 4 MG tablet TAKE 1 TABLET BY MOUTH AT BEDTIME AS NEEDED FOR MUSCLE SPASMS. (Patient not taking: Reported on 12/07/2022)   No facility-administered encounter medications on file as of 12/07/2022.    Allergies as of 12/07/2022   (No Known Allergies)    Past Medical History:  Diagnosis Date   Aortic aneurysm (HCC)    Aortic root enlargement (HCC)    CT 42 mm 2014   Chronic back pain    in 2022 pt states "it ended [place] ago"   Diabetes mellitus    type II   DJD (degenerative joint disease)    per MRI of LS spine 2005   GERD (gastroesophageal reflux disease)    Hyperlipidemia    Hypertension    Hypogonadism male    dx w/ hypogonadism elsewhere ~ 2011   Normal cardiac stress test    (-) stress test 2003, 2006   S/P cardiac cath  04-2006 neg per pt--CP improved w/ PPIs   Sleep apnea    uses cpap   Tachycardia    ER 07-2012, ?SCR, on atenolol    Past Surgical History:  Procedure Laterality Date   CARDIAC CATHETERIZATION     COLONOSCOPY     CYSTECTOMY     from back   KNEE ARTHROSCOPY WITH MENISCAL REPAIR Right 2021   POLYPECTOMY      Family History  Problem Relation Age of Onset   Heart disease Father        CABG at age 66   Hypertension Father    Colon polyps Brother    Heart disease Brother        CABG   Sinusitis Brother    Prostate cancer Brother 33   Stomach cancer Paternal Uncle    Bladder Cancer Paternal Uncle    Diabetes Other        cousins   Colon cancer Neg Hx    Rectal cancer Neg Hx     Esophageal cancer Neg Hx     Social History   Socioeconomic History   Marital status: Married    Spouse name: Not on file   Number of children: 2   Years of education: Not on file   Highest education level: Not on file  Occupational History   Occupation: TRUCK DRIVER  Tobacco Use   Smoking status: Former    Types: Cigars    Quit date: 03/12/1981    Years since quitting: 41.7   Smokeless tobacco: Never  Vaping Use   Vaping status: Never Used  Substance and Sexual Activity   Alcohol use: Yes    Alcohol/week: 1.0 standard drink of alcohol    Types: 1 Cans of beer per week    Comment: occasionally less than weekly   Drug use: No   Sexual activity: Not on file  Other Topics Concern   Not on file  Social History Narrative   Truck driver (local); lives w/ wife   Social Determinants of Health   Financial Resource Strain: Not on file  Food Insecurity: Not on file  Transportation Needs: Not on file  Physical Activity: Not on file  Stress: Not on file  Social Connections: Not on file  Intimate Partner Violence: Not on file    Review of Systems  Constitutional:  Negative for activity change.  Respiratory:  Positive for apnea.   Psychiatric/Behavioral:  Positive for sleep disturbance.     There were no vitals filed for this visit.    Physical Exam Constitutional:      Appearance: He is obese.  HENT:     Head: Normocephalic.     Nose: Nose normal.     Mouth/Throat:     Mouth: Mucous membranes are moist.     Comments: Mallampati 3, Eyes:     Pupils: Pupils are equal, round, and reactive to light.  Cardiovascular:     Rate and Rhythm: Normal rate and regular rhythm.     Heart sounds: No murmur heard.    No friction rub.  Pulmonary:     Effort: No respiratory distress.     Breath sounds: No stridor. No wheezing or rhonchi.  Musculoskeletal:     Cervical back: No rigidity or tenderness.  Neurological:     Mental Status: He is alert.  Psychiatric:        Mood  and Affect: Mood normal.    Data Reviewed: Compliance shows 100% compliance AutoSet 5-11 Residual AHI of  0.6, 95 percentile pressure of 9.7  Assessment:  Severe obstructive sleep apnea -Optimally treated with CPAP -Continues to benefit from CPAP use  Encouraged to continue CPAP on a nightly basis   Plan/Recommendations: Yearly follow-up  Call us with significant concerns     Virl Diamond MD [facility] 12/07/2022, 3:47 PM  CC: Wanda Plump, MD

## 2022-12-07 NOTE — Patient Instructions (Signed)
The download from your machine shows it is working well  Continue using your CPAP on a nightly basis  Call us with significant concerns  I will see you a year from now

## 2022-12-13 ENCOUNTER — Ambulatory Visit: Payer: 59 | Admitting: Bariatrics

## 2022-12-19 ENCOUNTER — Ambulatory Visit: Payer: 59 | Admitting: Internal Medicine

## 2022-12-25 ENCOUNTER — Other Ambulatory Visit: Payer: Self-pay | Admitting: Internal Medicine

## 2022-12-27 ENCOUNTER — Other Ambulatory Visit (HOSPITAL_BASED_OUTPATIENT_CLINIC_OR_DEPARTMENT_OTHER): Payer: Self-pay

## 2022-12-27 ENCOUNTER — Other Ambulatory Visit: Payer: Self-pay | Admitting: Bariatrics

## 2022-12-27 DIAGNOSIS — E1159 Type 2 diabetes mellitus with other circulatory complications: Secondary | ICD-10-CM

## 2022-12-27 MED ORDER — MOUNJARO 10 MG/0.5ML ~~LOC~~ SOAJ
10.0000 mg | SUBCUTANEOUS | 0 refills | Status: DC
  Filled 2022-12-27: qty 2, 28d supply, fill #0

## 2023-01-04 ENCOUNTER — Ambulatory Visit: Payer: 59 | Admitting: Internal Medicine

## 2023-01-07 ENCOUNTER — Encounter: Payer: Self-pay | Admitting: Bariatrics

## 2023-01-07 ENCOUNTER — Other Ambulatory Visit (HOSPITAL_BASED_OUTPATIENT_CLINIC_OR_DEPARTMENT_OTHER): Payer: Self-pay

## 2023-01-07 ENCOUNTER — Ambulatory Visit: Payer: 59 | Admitting: Bariatrics

## 2023-01-07 VITALS — BP 109/70 | HR 74 | Temp 97.5°F | Ht 75.0 in | Wt 247.0 lb

## 2023-01-07 DIAGNOSIS — E669 Obesity, unspecified: Secondary | ICD-10-CM

## 2023-01-07 DIAGNOSIS — I10 Essential (primary) hypertension: Secondary | ICD-10-CM

## 2023-01-07 DIAGNOSIS — E1159 Type 2 diabetes mellitus with other circulatory complications: Secondary | ICD-10-CM

## 2023-01-07 DIAGNOSIS — Z6830 Body mass index (BMI) 30.0-30.9, adult: Secondary | ICD-10-CM | POA: Diagnosis not present

## 2023-01-07 DIAGNOSIS — Z7985 Long-term (current) use of injectable non-insulin antidiabetic drugs: Secondary | ICD-10-CM

## 2023-01-07 DIAGNOSIS — E66811 Obesity, class 1: Secondary | ICD-10-CM

## 2023-01-07 MED ORDER — MOUNJARO 10 MG/0.5ML ~~LOC~~ SOAJ
10.0000 mg | SUBCUTANEOUS | 0 refills | Status: DC
  Filled 2023-01-07 – 2023-01-20 (×2): qty 2, 28d supply, fill #0

## 2023-01-07 NOTE — Progress Notes (Unsigned)
   WEIGHT SUMMARY AND BIOMETRICS  Weight Lost Since Last Visit: 5lb  Weight Gained Since Last Visit: 0   Vitals Pulse Rate: 74 SpO2: 97 %   Anthropometric Measurements Height: 6\' 3"  (1.905 m) Weight: 247 lb (112 kg) BMI (Calculated): 30.87 Weight at Last Visit: 252lb Weight Lost Since Last Visit: 5lb Weight Gained Since Last Visit: 0 Starting Weight: 262lb Total Weight Loss (lbs): 15 lb (6.804 kg)   Body Composition  Body Fat %: 31 % Fat Mass (lbs): 76.8 lbs Muscle Mass (lbs): 162.6 lbs Total Body Water (lbs): 119.6 lbs Visceral Fat Rating : 17   Other Clinical Data Fasting: no Labs: no Today's Visit #: 5 Starting Date: 08/16/22    OBESITY Davio is here to discuss his progress with his obesity treatment plan along with follow-up of his obesity related diagnoses.     Nutrition Plan: the Category 4 plan - 80-85% adherence.  Current exercise: walking  Interim History:  He is down 5 lbs since  {aabnutritionassessment:29213}  Pharmacotherapy: Jacaleb is on {dwwpharmacotherapy:29109} Adverse side effects: {dwwse:29122} Hunger is {EWCONTROLASSESSMENT:24261}.  Cravings are {EWCONTROLASSESSMENT:24261}.  Assessment/Plan:   1. Type 2 diabetes mellitus with other circulatory complication, without Sulser-term current use of insulin (HCC) ***     {dwwmorbid:29108::"Morbid Obesity"}: Current BMI BMI (Calculated): 30.87   Pharmacotherapy Plan {dwwmed:29123}  {dwwpharmacotherapy:29109}  Issaac {CHL AMB IS/IS NOT:210130109} currently in the action stage of change. As such, his goal is to {MWMwtloss#1:210800005}.  He has agreed to {dwwsldiets:29085}.  Exercise goals: {MWM EXERCISE RECS:23473}  Behavioral modification strategies: {dwwslwtlossstrategies:29088}.  Lennox has agreed to follow-up with our clinic in {NUMBER 1-10:22536} weeks.   No orders of the defined types were placed in this encounter.   There are no discontinued medications.   No orders of  the defined types were placed in this encounter.     Objective:   VITALS: Per patient if applicable, see vitals. GENERAL: Alert and in no acute distress. CARDIOPULMONARY: No increased WOB. Speaking in clear sentences.  PSYCH: Pleasant and cooperative. Speech normal rate and rhythm. Affect is appropriate. Insight and judgement are appropriate. Attention is focused, linear, and appropriate.  NEURO: Oriented as arrived to appointment on time with no prompting.   Attestation Statements:   ***(delete if time-based billing not used) Time spent on visit including the items listed below was *** minutes.  -preparing to see the patient (e.g., review of tests, history, previous notes) -obtaining and/or reviewing separately obtained history -counseling and educating the patient/family/caregiver -documenting clinical information in the electronic or other health record -ordering medications, tests, or procedures -independently interpreting results and communicating results to the patient/ family/caregiver -referring and communicating with other health care professionals  -care coordination   This was prepared with the assistance of Engineer, civil (consulting).  Occasional wrong-word or sound-a-like substitutions may have occurred due to the inherent limitations of voice recognition software.

## 2023-01-08 ENCOUNTER — Encounter: Payer: Self-pay | Admitting: Bariatrics

## 2023-01-10 NOTE — Telephone Encounter (Signed)
Can you please advise?

## 2023-01-11 ENCOUNTER — Encounter: Payer: Self-pay | Admitting: Internal Medicine

## 2023-01-11 ENCOUNTER — Ambulatory Visit: Payer: 59 | Admitting: Internal Medicine

## 2023-01-11 VITALS — BP 108/68 | HR 70 | Temp 98.1°F | Resp 16 | Ht 75.0 in | Wt 248.1 lb

## 2023-01-11 DIAGNOSIS — E1159 Type 2 diabetes mellitus with other circulatory complications: Secondary | ICD-10-CM

## 2023-01-11 DIAGNOSIS — Z7984 Long term (current) use of oral hypoglycemic drugs: Secondary | ICD-10-CM

## 2023-01-11 DIAGNOSIS — I10 Essential (primary) hypertension: Secondary | ICD-10-CM

## 2023-01-11 LAB — CBC WITH DIFFERENTIAL/PLATELET
Basophils Absolute: 0.1 10*3/uL (ref 0.0–0.1)
Basophils Relative: 0.9 % (ref 0.0–3.0)
Eosinophils Absolute: 0.3 10*3/uL (ref 0.0–0.7)
Eosinophils Relative: 3.6 % (ref 0.0–5.0)
HCT: 39.1 % (ref 39.0–52.0)
Hemoglobin: 12.9 g/dL — ABNORMAL LOW (ref 13.0–17.0)
Lymphocytes Relative: 16.6 % (ref 12.0–46.0)
Lymphs Abs: 1.3 10*3/uL (ref 0.7–4.0)
MCHC: 33 g/dL (ref 30.0–36.0)
MCV: 92.2 fL (ref 78.0–100.0)
Monocytes Absolute: 0.7 10*3/uL (ref 0.1–1.0)
Monocytes Relative: 8.9 % (ref 3.0–12.0)
Neutro Abs: 5.4 10*3/uL (ref 1.4–7.7)
Neutrophils Relative %: 70 % (ref 43.0–77.0)
Platelets: 287 10*3/uL (ref 150.0–400.0)
RBC: 4.24 Mil/uL (ref 4.22–5.81)
RDW: 13 % (ref 11.5–15.5)
WBC: 7.7 10*3/uL (ref 4.0–10.5)

## 2023-01-11 LAB — BASIC METABOLIC PANEL
BUN: 22 mg/dL (ref 6–23)
CO2: 27 meq/L (ref 19–32)
Calcium: 9.6 mg/dL (ref 8.4–10.5)
Chloride: 101 meq/L (ref 96–112)
Creatinine, Ser: 0.89 mg/dL (ref 0.40–1.50)
GFR: 90.04 mL/min (ref >60.00)
Glucose, Bld: 104 mg/dL — ABNORMAL HIGH (ref 70–99)
Potassium: 3.9 meq/L (ref 3.5–5.1)
Sodium: 139 meq/L (ref 135–145)

## 2023-01-11 LAB — MICROALBUMIN / CREATININE URINE RATIO
Creatinine,U: 212.2 mg/dL
Microalb Creat Ratio: 0.5 mg/g (ref 0.0–30.0)
Microalb, Ur: 1.1 mg/dL (ref 0.0–1.9)

## 2023-01-11 NOTE — Assessment & Plan Note (Signed)
DM: Last A1c 7.7, I increased pioglitazone to 45 mg, subsequently was seen at the wellness clinic and is now on [facility].  He also takes metformin.  Last A1c 6.4. Diet has improved, less carbohydrates, more vegetables. Praised!  Plan: Check micro otherwise no change, Obesity: Seen at the wellness clinic.  Weight about 6 months ago was 275 pounds, today 248. HTN: Ambulatory BPs 120 or less, at times in the low side (90s) with some dizziness.  Plan: Continue atenolol, losartan, decrease HCTZ to half tablet daily.  Continue decreasing BP meds if needed.  Check BMP. Elevated calcium coronary score Cardiology OV 07/13/2022, no changes suggested. Vaccine advice provided, patient not inclined to proceed, benefits discussed. RTC 05/2023 CPX

## 2023-01-11 NOTE — Progress Notes (Signed)
Subjective:    Patient ID: Anthony [name], male    DOB: [birth year], 67 y.o.   MRN: [id number]  DOS:  01/11/2023 Type of visit - description: Follow-up  Since the last visit is doing great, follow-up on the wellness clinic, + weight loss.  On Mounjaro. BPs has been in the low side. Diet has improved.   Review of Systems See above   Past Medical History:  Diagnosis Date   Aortic aneurysm (HCC)    Aortic root enlargement (HCC)    CT 42 mm 2014   Chronic back pain    in 2022 pt states "it ended [name] ago"   Diabetes mellitus    type II   DJD (degenerative joint disease)    per MRI of LS spine 2005   GERD (gastroesophageal reflux disease)    Hyperlipidemia    Hypertension    Hypogonadism male    dx w/ hypogonadism elsewhere ~ 2011   Normal cardiac stress test    (-) stress test 2003, 2006   S/P cardiac cath     04-2006 neg per pt--CP improved w/ PPIs   Sleep apnea    uses cpap   Tachycardia    ER 07-2012, ?SCR, on atenolol    Past Surgical History:  Procedure Laterality Date   CARDIAC CATHETERIZATION     COLONOSCOPY     CYSTECTOMY     from back   KNEE ARTHROSCOPY WITH MENISCAL REPAIR Right 2021   POLYPECTOMY      Current Outpatient Medications  Medication Instructions   aspirin 81 mg, Oral, Daily,     atenolol (TENORMIN) 25 mg, Oral, Daily   hydrochlorothiazide (HYDRODIURIL) 12.5 mg, Oral, Daily   losartan (COZAAR) 25 mg, Oral, Daily   metFORMIN (GLUCOPHAGE) 850 MG tablet TAKE 2 TABLETS BY MOUTH DAILY WITH BREAKFAST AND 1 TABLET DAILY WITH SUPPER.   Mounjaro 10 mg, Subcutaneous, Weekly   ONETOUCH ULTRA test strip CHECK BLOOD SUGAR NO MORE THAN TWICE DAILY. DX IS E11.9. MAX ON INSURANCE   pioglitazone (ACTOS) 45 mg, Oral, Daily   rosuvastatin (CRESTOR) 20 mg, Oral, Daily       Objective:   Physical Exam BP 108/68   Pulse 70   Temp 98.1 F (36.7 C) (Oral)   Resp 16   Ht 6\' 3"  (1.905 m)   Wt 248 lb 2 oz (112.5 kg)   SpO2 94%   BMI 31.01 kg/m  General:    Well developed, NAD, BMI noted. HEENT:  Normocephalic . Face symmetric, atraumatic Lungs:  CTA B Normal respiratory effort, no intercostal retractions, no accessory muscle use. Heart: RRR,  no murmur.  Lower extremities: no pretibial edema bilaterally  Skin: Not pale. Not jaundice Neurologic:  alert & oriented X3.  Speech normal, gait appropriate for age and unassisted Psych--  Cognition and judgment appear intact.  Cooperative with normal attention span and concentration.  Behavior appropriate. No anxious or depressed appearing.      Assessment   Assessment  DM HTN Hyperlipidemia CV Dr Eden Emms, next visit 08-2016 --Tachycardia : ER eval 2014,   rx atenolol --stress test 2003, 2006; Chest pain-2008,  (-) cardiac cath, sx decrease with PPIs ---Aorta enlargment per CT  2014, saw surgery 12-2016, they will cont observation -Calcium coronary score 169, 03/20/2021, changed Pravachol to  Crestor OSA , severe - on Cpap GERD DJD, Chronic back pain, back MRI 2005 >>  OA Hypogonadism DX 2011, 2013:  FSH, LH and prolactin, declined Endo referral + FH  Prostate ca brother age 27    PLAN  DM: Last A1c 7.7, I increased pioglitazone to 45 mg, subsequently was seen at the wellness clinic and is now on [name].  He also takes metformin.  Last A1c 6.4. Diet has improved, less carbohydrates, more vegetables. Praised!  Plan: Check micro otherwise no change, Obesity: Seen at the wellness clinic.  Weight about 6 months ago was 275 pounds, today 248. HTN: Ambulatory BPs 120 or less, at times in the low side (90s) with some dizziness.  Plan: Continue atenolol, losartan, decrease HCTZ to half tablet daily.  Continue decreasing BP meds if needed.  Check BMP. Elevated calcium coronary score Cardiology OV 07/13/2022, no changes suggested. Vaccine advice provided, patient not inclined to proceed, benefits discussed. RTC 05/2023 CPX

## 2023-01-11 NOTE — Patient Instructions (Addendum)
Decrease hydrochlorothiazide 25 mg to only 1/2 tablet a day  Continue checking your blood pressure regularly Blood pressure goal:  between 110/65 and  135/85. If it is consistently higher or lower, let me know     GO TO THE LAB : Get the blood work     Next visit with me by 05-2023, complete physical exam Please schedule it at the front desk   Vaccines I recommend: Covid booster RSV vaccine Flu shot

## 2023-01-21 ENCOUNTER — Other Ambulatory Visit (HOSPITAL_BASED_OUTPATIENT_CLINIC_OR_DEPARTMENT_OTHER): Payer: Self-pay

## 2023-02-05 ENCOUNTER — Encounter: Payer: Self-pay | Admitting: Bariatrics

## 2023-02-05 ENCOUNTER — Ambulatory Visit: Payer: 59 | Admitting: Bariatrics

## 2023-02-05 VITALS — BP 115/73 | HR 65 | Temp 97.7°F | Ht 75.0 in | Wt 247.0 lb

## 2023-02-05 DIAGNOSIS — E1159 Type 2 diabetes mellitus with other circulatory complications: Secondary | ICD-10-CM | POA: Diagnosis not present

## 2023-02-05 DIAGNOSIS — Z6830 Body mass index (BMI) 30.0-30.9, adult: Secondary | ICD-10-CM

## 2023-02-05 DIAGNOSIS — R600 Localized edema: Secondary | ICD-10-CM | POA: Diagnosis not present

## 2023-02-05 DIAGNOSIS — Z7985 Long-term (current) use of injectable non-insulin antidiabetic drugs: Secondary | ICD-10-CM

## 2023-02-05 DIAGNOSIS — E669 Obesity, unspecified: Secondary | ICD-10-CM | POA: Diagnosis not present

## 2023-02-05 NOTE — Progress Notes (Unsigned)
WEIGHT SUMMARY AND BIOMETRICS  Weight Lost Since Last Visit: 0lb  Weight Gained Since Last Visit: 0lb   Vitals Temp: 97.7 F (36.5 C) BP: 115/73 Pulse Rate: 65 SpO2: 98 %   Anthropometric Measurements Height: 6\' 3"  (1.905 m) Weight: 247 lb (112 kg) BMI (Calculated): 30.87 Weight at Last Visit: 247lb Weight Lost Since Last Visit: 0lb Weight Gained Since Last Visit: 0lb Starting Weight: 262lb Total Weight Loss (lbs): 15 lb (6.804 kg)   Body Composition  Body Fat %: 31.6 % Fat Mass (lbs): 78 lbs Muscle Mass (lbs): 160.6 lbs Total Body Water (lbs): 122 lbs Visceral Fat Rating : 18   Other Clinical Data Fasting: No Labs: No Today's Visit #: 6 Starting Date: 08/16/22    OBESITY Anthony [name] is here to discuss his progress with his obesity treatment plan along with follow-up of his obesity related diagnoses.    Nutrition Plan: the Category 4 plan - 90% adherence.  Current exercise: walking  Interim History:  His weight remains the same. According to the bio-impedence his muscle has gone down slightly and his total body water has gown up slightly.  Eating all of the food on the plan., Is not skipping meals, Meeting protein goals., and Water intake is adequate.   Pharmacotherapy: Anthony [name] is on Mounjaro 10 mg SQ weekly Adverse side effects: None Hunger is moderately controlled.  Cravings are moderately controlled.  Assessment/Plan:   Type II Diabetes HgbA1c is not at goal. Last A1c was 6.4 CBGs: Fasting 100 to 118       Episodes of hypoglycemia: no Medication(s): Mounjaro 10 mg SQ weekly  Lab Results  Component Value Date   HGBA1C 6.4 11/02/2022   HGBA1C 7.7 (H) 05/25/2022   HGBA1C 7.4 (H) 02/23/2022   Lab Results  Component Value Date   MICROALBUR 1.1 01/11/2023   LDLCALC 46 05/25/2022   CREATININE 0.89 01/11/2023   Lab Results  Component  Value Date   GFR 90.04 01/11/2023   GFR 88.95 05/25/2022   GFR 90.83 10/13/2021    Plan: {dwwmed:29123} {dwwpharmacotherapy:29109} Continue all other medications.  Will keep all carbohydrates low both sweets and starches.  Will continue exercise regimen to 30 to 60 minutes on most days of the week.  Aim for 7 to 9 hours of sleep nightly.  Eat more low glycemic index foods.   Fluid retention in legs:     {dwwmorbid:29108::"Morbid Obesity"}: Current BMI BMI (Calculated): 30.87   Pharmacotherapy Plan {dwwmed:29123}  {dwwpharmacotherapy:29109}  Wilmore {CHL AMB IS/IS NOT:210130109} currently in the action stage of change. As such, his goal is to {MWMwtloss#1:210800005}.  He has agreed to {dwwsldiets:29085}.  Exercise goals: {MWM EXERCISE RECS:23473}  Behavioral modification strategies: {dwwslwtlossstrategies:29088}.  Khazi has agreed to follow-up with our clinic in {NUMBER 1-10:22536} weeks.      Objective:   VITALS: Per patient if applicable, see vitals. GENERAL: Alert and  in no acute distress. CARDIOPULMONARY: No increased WOB. Speaking in clear sentences.  PSYCH: Pleasant and cooperative. Speech normal rate and rhythm. Affect is appropriate. Insight and judgement are appropriate. Attention is focused, linear, and appropriate.  NEURO: Oriented as arrived to appointment on time with no prompting.   Attestation Statements:    This was prepared with the assistance of Engineer, civil (consulting).  Occasional wrong-word or sound-a-like substitutions may have occurred due to the inherent limitations of voice recognition   Anthony Capra, DO

## 2023-02-19 ENCOUNTER — Other Ambulatory Visit: Payer: Self-pay | Admitting: Thoracic Surgery (Cardiothoracic Vascular Surgery)

## 2023-02-20 ENCOUNTER — Other Ambulatory Visit: Payer: Self-pay | Admitting: Bariatrics

## 2023-02-20 ENCOUNTER — Other Ambulatory Visit: Payer: Self-pay | Admitting: Thoracic Surgery (Cardiothoracic Vascular Surgery)

## 2023-02-20 DIAGNOSIS — I7121 Aneurysm of the ascending aorta, without rupture: Secondary | ICD-10-CM

## 2023-02-20 DIAGNOSIS — E1159 Type 2 diabetes mellitus with other circulatory complications: Secondary | ICD-10-CM

## 2023-02-21 ENCOUNTER — Encounter: Payer: Self-pay | Admitting: Bariatrics

## 2023-02-21 ENCOUNTER — Other Ambulatory Visit (HOSPITAL_BASED_OUTPATIENT_CLINIC_OR_DEPARTMENT_OTHER): Payer: Self-pay

## 2023-02-25 ENCOUNTER — Other Ambulatory Visit: Payer: Self-pay | Admitting: Bariatrics

## 2023-02-25 DIAGNOSIS — E1159 Type 2 diabetes mellitus with other circulatory complications: Secondary | ICD-10-CM

## 2023-02-25 MED ORDER — MOUNJARO 10 MG/0.5ML ~~LOC~~ SOAJ: 10.0000 mg | SUBCUTANEOUS | 0 refills | Status: DC

## 2023-02-25 NOTE — Telephone Encounter (Signed)
Can you please advise? Patient was last seen on 02/05/23 and has a follow-up on 03/19/22

## 2023-03-20 ENCOUNTER — Ambulatory Visit: Payer: 59 | Admitting: Bariatrics

## 2023-03-26 ENCOUNTER — Encounter: Payer: Self-pay | Admitting: Bariatrics

## 2023-03-26 ENCOUNTER — Ambulatory Visit: Payer: 59 | Admitting: Bariatrics

## 2023-03-26 ENCOUNTER — Other Ambulatory Visit (HOSPITAL_BASED_OUTPATIENT_CLINIC_OR_DEPARTMENT_OTHER): Payer: Self-pay

## 2023-03-26 VITALS — BP 99/58 | HR 63 | Temp 97.5°F | Ht 75.0 in | Wt 248.0 lb

## 2023-03-26 DIAGNOSIS — E1159 Type 2 diabetes mellitus with other circulatory complications: Secondary | ICD-10-CM

## 2023-03-26 DIAGNOSIS — Z6831 Body mass index (BMI) 31.0-31.9, adult: Secondary | ICD-10-CM

## 2023-03-26 DIAGNOSIS — E669 Obesity, unspecified: Secondary | ICD-10-CM | POA: Diagnosis not present

## 2023-03-26 DIAGNOSIS — Z7985 Long-term (current) use of injectable non-insulin antidiabetic drugs: Secondary | ICD-10-CM

## 2023-03-26 DIAGNOSIS — E6609 Other obesity due to excess calories: Secondary | ICD-10-CM

## 2023-03-26 DIAGNOSIS — R600 Localized edema: Secondary | ICD-10-CM

## 2023-03-26 MED ORDER — MOUNJARO 10 MG/0.5ML ~~LOC~~ SOAJ
10.0000 mg | SUBCUTANEOUS | 0 refills | Status: DC
  Filled 2023-03-26: qty 2, 28d supply, fill #0

## 2023-03-26 NOTE — Progress Notes (Addendum)
 WEIGHT SUMMARY AND BIOMETRICS  Weight Lost Since Last Visit: 0  Weight Gained Since Last Visit: 1lb   Vitals Temp: (!) 97.5 F (36.4 C) BP: (!) 99/58 Pulse Rate: 63 SpO2: 97 %   Anthropometric Measurements Height: 6' 3 (1.905 m) Weight: 248 lb (112.5 kg) BMI (Calculated): 31 Weight at Last Visit: 247lb Weight Lost Since Last Visit: 0 Weight Gained Since Last Visit: 1lb Starting Weight: 262lb Total Weight Loss (lbs): 14 lb (6.35 kg)   Body Composition  Body Fat %: 32 % Fat Mass (lbs): 79.4 lbs Muscle Mass (lbs): 160 lbs Total Body Water (lbs): 120.6 lbs Visceral Fat Rating : 18   Other Clinical Data Fasting: no Labs: no Today's Visit #: 7 Starting Date: 08/16/22    OBESITY Norbert is here to discuss his progress with his obesity treatment plan along with follow-up of his obesity related diagnoses.    Nutrition Plan: the Category 4 plan - 75-80% adherence.  Current exercise: none  Interim History:  He is up 1 lb over the holidays. He is keeping more fluids in my legs. Eating all of the food on the plan., Protein intake is as prescribed, Is skipping meals, and Water intake is adequate.   Pharmacotherapy: Yesenia is on Mounjaro  10 mg SQ weekly Adverse side effects: None Hunger is moderately controlled.  Cravings are moderately controlled.  Assessment/Plan:   Type II Diabetes HgbA1c is at goal. Last A1c was 6.4 CBGs: Fasting 120 to 130, highest 136       Episodes of hypoglycemia: no Medication(s): Mounjaro  2.5 mg SQ weekly, Mounjaro  12.5 mg SQ weekly, and Saxenda  0.6 mg SQ daily  Lab Results  Component Value Date   HGBA1C 6.4 11/02/2022   HGBA1C 7.7 (H) 05/25/2022   HGBA1C 7.4 (H) 02/23/2022   Lab Results  Component Value Date   MICROALBUR 1.1 01/11/2023   LDLCALC 46 05/25/2022   CREATININE 0.89 01/11/2023   Lab Results  Component  Value Date   GFR 90.04 01/11/2023   GFR 88.95 05/25/2022   GFR 90.83 10/13/2021    Plan: Continue and refill Mounjaro  10 mg SQ weekly Continue all other medications.  Will keep all carbohydrates low both sweets and starches.  Will continue exercise regimen to 30 to 60 minutes on most days of the week.  Aim for 7 to 9 hours of sleep nightly.  Eat more low glycemic index foods.   Fluid retention both legs:   He has had variable fluid retention in his lower extremities which is slightly improved at this time.   Plan: He will continue his medications.  Will increase his water intake approximately 64 ounces daily.  He states that he will discuss his Actos  dose with his provider as he feels that this may be causing his swelling he was on 30 mg in the past.    Generalized Obesity: Current  BMI BMI (Calculated): 31   Pharmacotherapy Plan Continue and refill  Mounjaro  10 mg SQ weekly  Julus is currently in the action stage of change. As such, his goal is to continue with weight loss efforts.  He has agreed to the Category 4 plan. He will focus on protein and healthy fats.   Exercise goals: Older adults should determine their level of effort for physical activity relative to their level of fitness.   Behavioral modification strategies: increasing lean protein intake, meal planning , increase water intake, better snacking choices, planning for success, increasing vegetables, avoiding temptations, keep healthy foods in the home, and mindful eating.  Pervis has agreed to follow-up with our clinic in 4 weeks.       Objective:   VITALS: Per patient if applicable, see vitals. GENERAL: Alert and in no acute distress. CARDIOPULMONARY: No increased WOB. Speaking in clear sentences.  PSYCH: Pleasant and cooperative. Speech normal rate and rhythm. Affect is appropriate. Insight and judgement are appropriate. Attention is focused, linear, and appropriate.  NEURO: Oriented as arrived to appointment  on time with no prompting.   Attestation Statements:     This was prepared with the assistance of Engineer, Civil (consulting).  Occasional wrong-word or sound-a-like substitutions may have occurred due to the inherent limitations of voice recognition   Clayborne Daring, DO

## 2023-03-27 MED ORDER — TIRZEPATIDE 10 MG/0.5ML ~~LOC~~ SOAJ: 10.0000 mg | SUBCUTANEOUS | Status: DC

## 2023-04-05 ENCOUNTER — Ambulatory Visit
Admission: RE | Admit: 2023-04-05 | Discharge: 2023-04-05 | Disposition: A | Discharge status: Home / Self Care | Payer: 59 | Source: Ambulatory Visit | Attending: Thoracic Surgery (Cardiothoracic Vascular Surgery) | Admitting: Thoracic Surgery (Cardiothoracic Vascular Surgery)

## 2023-04-05 DIAGNOSIS — I7121 Aneurysm of the ascending aorta, without rupture: Secondary | ICD-10-CM

## 2023-04-05 MED ORDER — GADOPICLENOL 0.5 MMOL/ML IV SOLN
10.0000 mL | Freq: Once | INTRAVENOUS | Status: AC | PRN
  Administered 2023-04-05: 10 mL via INTRAVENOUS

## 2023-04-09 ENCOUNTER — Other Ambulatory Visit: Payer: Self-pay | Admitting: Thoracic Surgery (Cardiothoracic Vascular Surgery)

## 2023-04-09 ENCOUNTER — Encounter: Payer: Self-pay | Admitting: Thoracic Surgery (Cardiothoracic Vascular Surgery)

## 2023-04-09 ENCOUNTER — Ambulatory Visit: Payer: 59 | Admitting: Thoracic Surgery (Cardiothoracic Vascular Surgery)

## 2023-04-09 VITALS — BP 116/73 | HR 69 | Resp 20 | Wt 261.0 lb

## 2023-04-09 DIAGNOSIS — I719 Aortic aneurysm of unspecified site, without rupture: Secondary | ICD-10-CM

## 2023-04-09 DIAGNOSIS — I7121 Aneurysm of the ascending aorta, without rupture: Secondary | ICD-10-CM

## 2023-04-09 NOTE — Progress Notes (Signed)
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  Anthony [name] is a 67 year old man with a history of hypertension, hyperlipidemia, obesity, type 2 diabetes, reflux, sleep apnea, and a sinus of Valsalva aneurysm.  Aneurysm first noted on echocardiogram in 2014.  I last saw him in the office in July 2024.  He was doing well at that time and the aneurysm was stable.  In the interim since his last visit he is continuing to do well.  He says he has lost about 35 pounds with Mounjaro.  His blood pressure has been well-controlled.  No chest pain, pressure, tightness, or shortness of breath.  He is concerned about his brother who has an abdominal aneurysm.  He is scheduled to have surgery on his left in about a week and a half.  Past Medical History:  Diagnosis Date   Aortic aneurysm (HCC)    Aortic root enlargement (HCC)    CT 42 mm 2014   Chronic back pain    in 2022 pt states "it ended [place] ago"   Diabetes mellitus    type II   DJD (degenerative joint disease)    per MRI of LS spine 2005   GERD (gastroesophageal reflux disease)    Hyperlipidemia    Hypertension    Hypogonadism male    dx w/ hypogonadism elsewhere ~ 2011   Normal cardiac stress test    (-) stress test 2003, 2006   S/P cardiac cath     04-2006 neg per pt--CP improved w/ PPIs   Sleep apnea    uses cpap   Tachycardia    ER 07-2012, ?SCR, on atenolol    Current Outpatient Medications  Medication Sig Dispense Refill   aspirin 81 MG tablet Take 81 mg by mouth daily.     atenolol (TENORMIN) 50 MG tablet Take 0.5 tablets (25 mg total) by mouth daily. 45 tablet 1   hydrochlorothiazide (HYDRODIURIL) 25 MG tablet Take 0.5 tablets (12.5 mg total) by mouth daily.     losartan (COZAAR) 25 MG tablet Take 1 tablet (25 mg total) by mouth daily. 90 tablet 1   metFORMIN (GLUCOPHAGE) 850 MG tablet TAKE 2 TABLETS BY MOUTH  DAILY WITH BREAKFAST AND 1 TABLET DAILY WITH SUPPER. 270 tablet 1   ONETOUCH ULTRA test strip CHECK BLOOD SUGAR NO MORE THAN TWICE DAILY. DX IS E11.9. MAX ON INSURANCE 100 strip 12   pioglitazone (ACTOS) 45 MG tablet Take 1 tablet (45 mg total) by mouth daily. 90 tablet 1   rosuvastatin (CRESTOR) 20 MG tablet Take 1 tablet (20 mg total) by mouth daily. 90 tablet 1   tirzepatide (MOUNJARO) 10 MG/0.5ML Pen Inject 10 mg into the skin once a week.     No current facility-administered medications for this visit.    Physical Exam BP 116/73 (BP Location: Left Arm, Patient Position: Sitting, Cuff Size: Large)   Pulse 69   Resp 20   Wt 261 lb (118.4 kg)   SpO2 95% Comment: RA  BMI 32.52 kg/m  68 year old man in no acute distress Alert and oriented x 3 with no focal deficits Lungs clear with equal breath sounds bilaterally Cardiac regular rate and rhythm with a normal S1 and S2, no murmur No carotid bruits No peripheral edema  Diagnostic  Tests: MRA CHEST WITH OR WITHOUT CONTRAST   TECHNIQUE: Angiographic images of the chest were obtained using MRA technique with intravenous contrast.   CONTRAST:  10 cc Vueway   COMPARISON:  Chest CT-02/16/2022; 08/19/2012   Cardiac CT-11/01/2014   chest MRA-08/31/2022; 08/19/2020; 08/21/2019; 08/08/2018; 12/20/2017; 06/21/2017   FINDINGS: Vascular Findings:   Once again, evaluation of the aortic root is significantly degraded secondary to pulsation artifact though appears grossly unchanged compared to remote chest CT performed 08/2012.   No evidence of thoracic aortic aneurysm or dissection with measurements as follows.   The descending thoracic aorta is tortuous but of normal caliber and without hemodynamically significant narrowing. Conventional configuration of the aortic arch.   Normal heart size.  No pericardial effusion.   Although this examination was not tailored for the evaluation the pulmonary arteries, there are no discrete  filling defects within the central pulmonary arterial tree to suggest central pulmonary embolism. Normal caliber of the main pulmonary artery.   -------------------------------------------------------------   Thoracic aortic measurements:   AORTIC ROOT: Significantly degraded secondary to pulsation artifact. Given this limitation, the aortic root measures proximally 48 mm in diameter (image 62, series 15), grossly unchanged compared to most remote chest CT performed 08/2012 by my direct remeasurement.   SINOTUBULAR JUNCTION: 35 mm as measured in greatest oblique short axis coronal dimension.   PROXIMAL ASCENDING THORACIC AORTA: 36 mm as measured in greatest oblique short axis axial dimension (image 44, series 14) at the level of the main pulmonary artery and approximately 36 mm as measured in greatest oblique short axis sagittal dimension (image 29, series 11)   AORTIC ARCH: 32 mm as measured in greatest oblique short axis sagittal dimension.   PROXIMAL DESCENDING THORACIC AORTA: 31 mm as measured in greatest oblique short axis axial dimension at the level of the main pulmonary artery.   DISTAL DESCENDING THORACIC AORTA: 29 mm as measured in greatest oblique short axis axial dimension at the level of the diaphragmatic hiatus.   Review of the MIP images confirms the above findings.   -------------------------------------------------------------   Non-Vascular Findings:   Mediastinum/Lymph Nodes: No bulky mediastinal, hilar or axillary lymphadenopathy.   Lungs/Pleura: Minimal dependent atelectasis. No discrete focal airspace opacities no pleural effusion.   Upper abdomen: Limited evaluation of the upper abdomen is unremarkable.   Musculoskeletal: Regional soft tissues appear normal.   IMPRESSION: Questioned aortic root dilatation measuring approximately 48 mm is once again suboptimally evaluated due to pulsation artifact, though appears grossly unchanged compared to  most remote chest CT performed 08/2012 by my direct remeasurement.     Electronically Signed   By: Simonne Come M.D.   On: 04/07/2023 13:55 I personally reviewed the MRI images.  Poor quality images.  For some reason was not cardiac gated.  Appears reasonably stable in size.  Impression: Anthony [name] is a 68 year old man with a history of hypertension, hyperlipidemia, obesity, type 2 diabetes, reflux, sleep apnea, and a sinus of Valsalva aneurysm.  Aneurysm first noted on echocardiogram in 2014.  Sinus of Valsalva aneurysm-stable about 4.7 to 4.8 cm.  Poor quality images.  Will get a cardiac gated CT chest at his next visit to get a more accurate measurement.  Hypertension-blood pressure well-controlled.  Family history of abdominal aortic aneurysm-he has never been checked.  Will get an ultrasound of his abdominal aorta to rule out an infrarenal aneurysm.  Type 2 diabetes-well-controlled with an A1c of 6.4 and significant weight loss since starting Mounjaro.  Plan: Ultrasound to rule  out abdominal aortic aneurysm Return in 6 months with cardiac gated CT angiogram of chest for follow-up of the sinus of Valsalva aneurysm  Loreli Slot, MD [facility] [phone]

## 2023-04-09 NOTE — Progress Notes (Signed)
[place]

## 2023-04-10 ENCOUNTER — Telehealth: Payer: Self-pay | Admitting: *Deleted

## 2023-04-10 ENCOUNTER — Telehealth: Payer: Self-pay | Admitting: Cardiovascular Disease

## 2023-04-10 NOTE — Telephone Encounter (Signed)
Pt has been scheduled tele preop appt 04/19/23. Med rec and consent are done.

## 2023-04-10 NOTE — Telephone Encounter (Signed)
   Pre-operative Risk Assessment    Patient Name: Anthony [name]  DOB: [birth year] MRN: [id number]      Request for Surgical Clearance    Procedure:   Left Carpometacarpal Arthroplasty   Date of Surgery:  Clearance 04/24/23                                 Surgeon:  Dr. Luiz Blare  Surgeon's Group or Practice Name:  [facility]  Phone number:  [phone] Fax number:  [phone]   Type of Clearance Requested:   - Medical  - Pharmacy:  Hold    Unsure    Type of Anesthesia:  General    Additional requests/questions:  Please advise surgeon/provider what medications should be held.  Signed, April Henson   04/10/2023, 4:06 PM

## 2023-04-10 NOTE — Telephone Encounter (Signed)
Pt has been scheduled tele preop appt 04/19/23. Med rec and consent are done.     Patient Consent for Virtual Visit        Anthony [name] has provided verbal consent on 04/10/2023 for a virtual visit (video or telephone).   CONSENT FOR VIRTUAL VISIT FOR:  Anthony [name]  By participating in this virtual visit I agree to the following:  I hereby voluntarily request, consent and authorize [facility] and its employed or contracted physicians, physician assistants, nurse practitioners or other licensed health care professionals (the Practitioner), to provide me with telemedicine health care services (the "Services") as deemed necessary by the treating Practitioner. I acknowledge and consent to receive the Services by the Practitioner via telemedicine. I understand that the telemedicine visit will involve communicating with the Practitioner through live audiovisual communication technology and the disclosure of certain medical information by electronic transmission. I acknowledge that I have been given the opportunity to request an in-person assessment or other available alternative prior to the telemedicine visit and am voluntarily participating in the telemedicine visit.  I understand that I have the right to withhold or withdraw my consent to the use of telemedicine in the course of my care at any time, without affecting my right to future care or treatment, and that the Practitioner or I may terminate the telemedicine visit at any time. I understand that I have the right to inspect all information obtained and/or recorded in the course of the telemedicine visit and may receive copies of available information for a reasonable fee.  I understand that some of the potential risks of receiving the Services via telemedicine include:  Delay or interruption in medical evaluation due to technological equipment failure or disruption; Information transmitted may not be sufficient (e.g. poor resolution of  images) to allow for appropriate medical decision making by the Practitioner; and/or  In rare instances, security protocols could fail, causing a breach of personal health information.  Furthermore, I acknowledge that it is my responsibility to provide information about my medical history, conditions and care that is complete and accurate to the best of my ability. I acknowledge that Practitioner's advice, recommendations, and/or decision may be based on factors not within their control, such as incomplete or inaccurate data provided by me or distortions of diagnostic images or specimens that may result from electronic transmissions. I understand that the practice of medicine is not an exact science and that Practitioner makes no warranties or guarantees regarding treatment outcomes. I acknowledge that a copy of this consent can be made available to me via my patient portal [facility] MyChart), or I can request a printed copy by calling the office of [facility].    I understand that my insurance will be billed for this visit.   I have read or had this consent read to me. I understand the contents of this consent, which adequately explains the benefits and risks of the Services being provided via telemedicine.  I have been provided ample opportunity to ask questions regarding this consent and the Services and have had my questions answered to my satisfaction. I give my informed consent for the services to be provided through the use of telemedicine in my medical care

## 2023-04-10 NOTE — Telephone Encounter (Signed)
   Name: Anthony [name]  DOB: [birth year]  MRN: [id number]  Primary Cardiologist: Charlton Haws, MD  Preoperative team, please contact this patient and set up a phone call appointment for further preoperative risk assessment. Please obtain consent and complete medication review. Thank you for your help.  I confirm that guidance regarding antiplatelet and oral anticoagulation therapy has been completed and, if necessary, noted below.   Regarding ASA therapy, we recommend continuation of ASA throughout the perioperative period.  However, if the surgeon feels that cessation of ASA is required in the perioperative period, it may be stopped 5-7 days prior to surgery with a plan to resume it as soon as felt to be feasible from a surgical standpoint in the post-operative period.   I also confirmed the patient resides in the state of [place]. As per [employer] [facility] [employer] telemedicine laws, the patient must reside in the state in which the provider is licensed.  Rip Harbour, NP 04/10/2023, 4:20 PM [facility]

## 2023-04-12 LAB — HM DIABETES EYE EXAM

## 2023-04-15 ENCOUNTER — Encounter: Payer: Self-pay | Admitting: Internal Medicine

## 2023-04-16 ENCOUNTER — Ambulatory Visit
Admission: RE | Admit: 2023-04-16 | Discharge: 2023-04-16 | Disposition: A | Discharge status: Home / Self Care | Payer: 59 | Source: Ambulatory Visit | Attending: Thoracic Surgery (Cardiothoracic Vascular Surgery) | Admitting: Thoracic Surgery (Cardiothoracic Vascular Surgery)

## 2023-04-16 DIAGNOSIS — I719 Aortic aneurysm of unspecified site, without rupture: Secondary | ICD-10-CM

## 2023-04-19 ENCOUNTER — Ambulatory Visit: Payer: 59 | Attending: Cardiology

## 2023-04-19 DIAGNOSIS — Z01810 Encounter for preprocedural cardiovascular examination: Secondary | ICD-10-CM

## 2023-04-19 NOTE — Progress Notes (Signed)
 Virtual Visit via Telephone Note   Because of Anthony [name]'s co-morbid illnesses, he is at least at moderate risk for complications without adequate follow up.  This format is felt to be most appropriate for this patient at this time.  The patient did not have access to video technology/had technical difficulties with video requiring transitioning to audio format only (telephone).  All issues noted in this document were discussed and addressed.  No physical exam could be performed with this format.  Please refer to the patient's chart for his consent to telehealth for Anthony [name].  Evaluation Performed:  Preoperative cardiovascular risk assessment _____________   Date:  04/19/2023   Patient ID:  Anthony [name], DOB 10-Sep-1955, MRN [id number] Patient Location:  Home Provider location:   Office  Primary Care Provider:  Amon Aloysius BRAVO, MD Primary Cardiologist:  Anthony Emmer, MD  Chief Complaint / Patient Profile   68 y.o. y/o male with a h/o hypertension, aneurysm of ascending aorta without rupture, diabetes mellitus, hyperlipidemia, mild nonobstructive CAD who is pending left carpometacarpal arthroplasty with Dr. Yvone and presents today for telephonic preoperative cardiovascular risk assessment.  History of Present Illness    Anthony [name] is a 68 y.o. male who presents via audio/video conferencing for a telehealth visit today.  Pt was last seen in cardiology clinic on 07/13/22 by Dr. Emmer. At that time Anthony [name] was doing well.  The patient is now pending procedure as outlined above. Since his last visit, he has remained stable from a cardiac standpoint.  Today he denies chest pain, shortness of breath, lower extremity edema, fatigue, palpitations, melena, hematuria, hemoptysis, diaphoresis, weakness, presyncope, syncope, orthopnea, and PND.   Past Medical History    Past Medical History:  Diagnosis Date   Aortic aneurysm (HCC)    Aortic root enlargement (HCC)    CT 42 mm 2014    Chronic back pain    in 2022 pt states it ended [place] ago   Diabetes mellitus    type II   DJD (degenerative joint disease)    per MRI of LS spine 2005   GERD (gastroesophageal reflux disease)    Hyperlipidemia    Hypertension    Hypogonadism male    dx w/ hypogonadism elsewhere ~ 2011   Normal cardiac stress test    (-) stress test 2003, 2006   S/P cardiac cath     04-2006 neg per pt--CP improved w/ PPIs   Sleep apnea    uses cpap   Tachycardia    ER 07-2012, ?SCR, on atenolol    Past Surgical History:  Procedure Laterality Date   CARDIAC CATHETERIZATION     COLONOSCOPY     CYSTECTOMY     from back   KNEE ARTHROSCOPY WITH MENISCAL REPAIR Right 2021   POLYPECTOMY     Allergies No Known Allergies Home Medications    Prior to Admission medications   Medication Sig Start Date End Date Taking? Authorizing Provider  aspirin 81 MG tablet Take 81 mg by mouth daily.    [provider]  atenolol  (TENORMIN ) 50 MG tablet Take 0.5 tablets (25 mg total) by mouth daily. 11/26/22   Anthony Aloysius BRAVO, MD  hydrochlorothiazide  (HYDRODIURIL ) 25 MG tablet Take 0.5 tablets (12.5 mg total) by mouth daily. 01/11/23   Anthony Aloysius BRAVO, MD  losartan  (COZAAR ) 25 MG tablet Take 1 tablet (25 mg total) by mouth daily. 11/30/22   Anthony Aloysius BRAVO, MD  metFORMIN  (GLUCOPHAGE ) 850 MG  tablet TAKE 2 TABLETS BY MOUTH DAILY WITH BREAKFAST AND 1 TABLET DAILY WITH SUPPER. 05/02/22   Paz, Jose E, MD  [name] ULTRA test strip CHECK BLOOD SUGAR NO MORE THAN TWICE DAILY. DX IS E11.9. MAX ON INSURANCE 04/16/22   Anthony Aloysius BRAVO, MD  pioglitazone  (ACTOS ) 45 MG tablet Take 1 tablet (45 mg total) by mouth daily. 11/26/22   Anthony Aloysius BRAVO, MD  rosuvastatin  (CRESTOR ) 20 MG tablet Take 1 tablet (20 mg total) by mouth daily. 12/07/22   Anthony Aloysius BRAVO, MD  tirzepatide  (MOUNJARO ) 10 MG/0.5ML Pen Inject 10 mg into the skin once a week. 03/27/23   Delores Clayborne LABOR, DO   Physical Exam   Vital Signs:  Anthony [name] does not have vital signs available for  review today. Given telephonic nature of communication, physical exam is limited. AAOx3. NAD. Normal affect.  Speech and respirations are unlabored. Accessory Clinical Findings   None Assessment & Plan    1.  Preoperative Cardiovascular Risk Assessment: left carpometacarpal arthroplasty with Dr. Yvone  [name]'s perioperative risk of a major cardiac event is 0.4% according to the Revised Cardiac Risk Index (RCRI).  Therefore, he is at low risk for perioperative complications.  His functional capacity is good, he is able to achieve greater than 4 METs of activity.  Recommendations: According to ACC/AHA guidelines, no further cardiovascular testing needed.  The patient may proceed to surgery at acceptable risk.   Antiplatelet and/or Anticoagulation Recommendations: Regarding ASA therapy, patient can hold aspirin 81 mg daily for 5 to 7 days prior to procedure.  Please resume when safe to do so from a bleeding standpoint.  The patient was advised that if he develops new symptoms prior to surgery to contact our office to arrange for a follow-up visit, and he verbalized understanding.  A copy of this note will be routed to requesting surgeon.  Time:   Today, I have spent 10 minutes with the patient with telehealth technology discussing medical history, symptoms, and management plan.    Michelyn Scullin D Baleigh Rennaker, NP  04/19/2023, 3:24 PM

## 2023-04-24 HISTORY — PX: OTHER SURGICAL HISTORY: SHX169

## 2023-04-29 ENCOUNTER — Encounter: Payer: Self-pay | Admitting: Bariatrics

## 2023-04-29 ENCOUNTER — Other Ambulatory Visit (HOSPITAL_BASED_OUTPATIENT_CLINIC_OR_DEPARTMENT_OTHER): Payer: Self-pay

## 2023-04-29 ENCOUNTER — Ambulatory Visit: Payer: 59 | Admitting: Bariatrics

## 2023-04-29 VITALS — BP 119/77 | HR 70 | Temp 98.0°F | Ht 75.0 in | Wt 247.0 lb

## 2023-04-29 DIAGNOSIS — E1159 Type 2 diabetes mellitus with other circulatory complications: Secondary | ICD-10-CM

## 2023-04-29 DIAGNOSIS — Z6830 Body mass index (BMI) 30.0-30.9, adult: Secondary | ICD-10-CM | POA: Diagnosis not present

## 2023-04-29 DIAGNOSIS — E119 Type 2 diabetes mellitus without complications: Secondary | ICD-10-CM | POA: Diagnosis not present

## 2023-04-29 DIAGNOSIS — E669 Obesity, unspecified: Secondary | ICD-10-CM

## 2023-04-29 DIAGNOSIS — I10 Essential (primary) hypertension: Secondary | ICD-10-CM

## 2023-04-29 DIAGNOSIS — E66811 Obesity, class 1: Secondary | ICD-10-CM

## 2023-04-29 DIAGNOSIS — Z7985 Long-term (current) use of injectable non-insulin antidiabetic drugs: Secondary | ICD-10-CM

## 2023-04-29 MED ORDER — TIRZEPATIDE 10 MG/0.5ML ~~LOC~~ SOAJ
10.0000 mg | SUBCUTANEOUS | 0 refills | Status: DC
  Filled 2023-04-29: qty 2, 28d supply, fill #0

## 2023-04-29 NOTE — Progress Notes (Unsigned)
 WEIGHT SUMMARY AND BIOMETRICS  Weight Lost Since Last Visit: 1lb  Weight Gained Since Last Visit: 0lb   Vitals Temp: 98 F (36.7 C) BP: 119/77 Pulse Rate: 70 SpO2: 99 %   Anthropometric Measurements Height: 6\' 3"  (1.905 m) Weight: 247 lb (112 kg) BMI (Calculated): 30.87 Weight at Last Visit: 248lb Weight Lost Since Last Visit: 1lb Weight Gained Since Last Visit: 0lb Starting Weight: 262lb Total Weight Loss (lbs): 15 lb (6.804 kg)   Body Composition  Body Fat %: 30.6 % Fat Mass (lbs): 75.8 lbs Muscle Mass (lbs): 163.6 lbs Total Body Water (lbs): 114.8 lbs Visceral Fat Rating : 17   Other Clinical Data Fasting: No Labs: No Today's Visit #: 8 Starting Date: 08/16/22    OBESITY Anthony [name] is here to discuss his progress with his obesity treatment plan along with follow-up of his obesity related diagnoses.    Nutrition Plan: the Category 4 plan - 80% adherence.  Current exercise: walking  Interim History:  He is down 1 lb since his last visit. He has hit a plateau. He had hand surgery and has been in pain which has impacted his eating.  Not eating all of the food on the plan., Protein intake is as prescribed, Is skipping meals, and Water intake is adequate.   Pharmacotherapy: Anthony [name] is on Anthony [name] 10 mg SQ weekly Adverse side effects: None Hunger is moderately controlled.  Cravings are moderately controlled.  Assessment/Plan:   Type II Diabetes HgbA1c is at goal. Last A1c was 6.4 CBGs: Not checking      Episodes of hypoglycemia: no Medication(s): Anthony [name] 10 mg SQ weekly  Lab Results  Component Value Date   HGBA1C 6.4 11/02/2022   HGBA1C 7.7 (H) 05/25/2022   HGBA1C 7.4 (H) 02/23/2022   Lab Results  Component Value Date   MICROALBUR 1.1 01/11/2023   LDLCALC 46 05/25/2022   CREATININE 0.89 01/11/2023   Lab Results  Component Value Date    GFR 90.04 01/11/2023   GFR 88.95 05/25/2022   GFR 90.83 10/13/2021    Plan: Continue Anthony [name] 10 mg SQ weekly Continue all other medications.  Will keep all carbohydrates low both sweets and starches.  Will continue exercise regimen to 30 to 60 minutes on most days of the week.  Aim for 7 to 9 hours of sleep nightly.  Eat more low glycemic index foods.  He will continue to read labels.  He will continue to eat more at home.   Hypertension Hypertension stable.  Medication(s): Hydrochlorothiazide 25 mg daily  and Atenolol 50 mg  BP Readings from Last 3 Encounters:  04/29/23 119/77  04/09/23 116/73  03/26/23 (!) 99/58   Lab Results  Component Value Date   CREATININE 0.89 01/11/2023   CREATININE 0.91 05/25/2022   CREATININE 0.90 02/16/2022   Lab Results  Component Value Date   GFR 90.04 01/11/2023  GFR 88.95 05/25/2022   GFR 90.83 10/13/2021    Plan: Continue all antihypertensives at current dosages. No added salt. Will keep sodium content to 1,500 mg or less per day.      Generalized Obesity: Current BMI BMI (Calculated): 30.87   Pharmacotherapy Plan Continue and refill  Anthony [name] 10 mg SQ weekly  Anthony [name] is currently in the action stage of change. As such, his goal is to continue with weight loss efforts.  He has agreed to the Category 4 plan.  Exercise goals: Older adults should determine their level of effort for physical activity relative to their level of fitness.  He will get back to his exercise routine when he has recuperated from his surgery.   Behavioral modification strategies: increasing lean protein intake, decreasing simple carbohydrates , no meal skipping, meal planning , increase water intake, better snacking choices, planning for success, increasing vegetables, get rid of junk food in the home, decrease snacking , avoiding temptations, and mindful eating.  Anthony [name] has agreed to follow-up with our clinic in 4 weeks. Will consider a low carbohydrate diet if  he continues on his weight plateau.   Objective:   VITALS: Per patient if applicable, see vitals. GENERAL: Alert and in no acute distress. CARDIOPULMONARY: No increased WOB. Speaking in clear sentences.  PSYCH: Pleasant and cooperative. Speech normal rate and rhythm. Affect is appropriate. Insight and judgement are appropriate. Attention is focused, linear, and appropriate.  NEURO: Oriented as arrived to appointment on time with no prompting.   Attestation Statements:   This was prepared with the assistance of Engineer, civil (consulting).  Occasional wrong-word or sound-a-like substitutions may have occurred due to the inherent limitations of voice recognition   Corinna Capra, DO

## 2023-05-26 ENCOUNTER — Other Ambulatory Visit: Payer: Self-pay | Admitting: Internal Medicine

## 2023-05-27 ENCOUNTER — Other Ambulatory Visit (HOSPITAL_BASED_OUTPATIENT_CLINIC_OR_DEPARTMENT_OTHER): Payer: Self-pay

## 2023-05-27 ENCOUNTER — Ambulatory Visit: Payer: 59 | Admitting: Bariatrics

## 2023-05-27 ENCOUNTER — Other Ambulatory Visit: Payer: Self-pay | Admitting: Internal Medicine

## 2023-05-27 ENCOUNTER — Encounter: Payer: Self-pay | Admitting: Bariatrics

## 2023-05-27 VITALS — BP 116/74 | HR 66 | Temp 98.2°F | Ht 75.0 in | Wt 251.0 lb

## 2023-05-27 DIAGNOSIS — Z7985 Long-term (current) use of injectable non-insulin antidiabetic drugs: Secondary | ICD-10-CM

## 2023-05-27 DIAGNOSIS — I10 Essential (primary) hypertension: Secondary | ICD-10-CM | POA: Diagnosis not present

## 2023-05-27 DIAGNOSIS — E669 Obesity, unspecified: Secondary | ICD-10-CM | POA: Diagnosis not present

## 2023-05-27 DIAGNOSIS — E119 Type 2 diabetes mellitus without complications: Secondary | ICD-10-CM

## 2023-05-27 DIAGNOSIS — Z6831 Body mass index (BMI) 31.0-31.9, adult: Secondary | ICD-10-CM

## 2023-05-27 DIAGNOSIS — E1159 Type 2 diabetes mellitus with other circulatory complications: Secondary | ICD-10-CM

## 2023-05-27 DIAGNOSIS — E66811 Obesity, class 1: Secondary | ICD-10-CM

## 2023-05-27 MED ORDER — TIRZEPATIDE 12.5 MG/0.5ML ~~LOC~~ SOAJ
12.5000 mg | SUBCUTANEOUS | 0 refills | Status: DC
  Filled 2023-05-27: qty 2, 28d supply, fill #0

## 2023-05-27 NOTE — Progress Notes (Unsigned)
 WEIGHT SUMMARY AND BIOMETRICS  Weight Lost Since Last Visit: 0lb  Weight Gained Since Last Visit: 4lb   Vitals Temp: 98.2 F (36.8 C) BP: 116/74 Pulse Rate: 66 SpO2: 98 %   Anthropometric Measurements Height: 6\' 3"  (1.905 m) Weight: 251 lb (113.9 kg) BMI (Calculated): 31.37 Weight at Last Visit: 247lb Weight Lost Since Last Visit: 0lb Weight Gained Since Last Visit: 4lb Starting Weight: 262lb Total Weight Loss (lbs): 11 lb (4.99 kg)   Body Composition  Body Fat %: 31.7 % Fat Mass (lbs): 79.8 lbs Muscle Mass (lbs): 163.4 lbs Total Body Water (lbs): 123.8 lbs Visceral Fat Rating : 18   Other Clinical Data Fasting: No Labs: No Today's Visit #: 9 Starting Date: 08/16/22    OBESITY Case is here to discuss his progress with his obesity treatment plan along with follow-up of his obesity related diagnoses.    Nutrition Plan: the Category 4 plan - 25% adherence.  Current exercise: none  Interim History:  He is up 4 lbs since his last visit.  Eating all of the food on the plan., Protein intake is as prescribed, Is not skipping meals, Meeting protein goals., and Water intake is adequate.   Pharmacotherapy: Dandy is on {dwwpharmacotherapy:29109} Adverse side effects: {dwwse:29122} Hunger is {EWCONTROLASSESSMENT:24261}.  Cravings are {EWCONTROLASSESSMENT:24261}.  Assessment/Plan:   Type II Diabetes HgbA1c {ACTION; IS/IS NOT:21021397} at goal. Last A1c was *** CBGs: {dwwcbg:29160}      Episodes of hypoglycemia: {yes***/no:17258} Medication(s): {dwwpharmacotherapy:29109}  Lab Results  Component Value Date   HGBA1C 6.4 11/02/2022   HGBA1C 7.7 (H) 05/25/2022   HGBA1C 7.4 (H) 02/23/2022   Lab Results  Component Value Date   MICROALBUR 1.1 01/11/2023   LDLCALC 46 05/25/2022   CREATININE 0.89 01/11/2023   Lab Results  Component Value Date    GFR 90.04 01/11/2023   GFR 88.95 05/25/2022   GFR 90.83 10/13/2021    Plan: {dwwmed:29123} {dwwpharmacotherapy:29109} Continue all other medications.  Will keep all carbohydrates low both sweets and starches.  Will continue exercise regimen to 30 to 60 minutes on most days of the week.  Aim for 7 to 9 hours of sleep nightly.  Eat more low glycemic index foods.    Hypertension Hypertension well controlled.  Medication(s): Cozaar 25 mg daily and Hydrochlorothiazide 25 mg daily   BP Readings from Last 3 Encounters:  05/27/23 116/74  04/29/23 119/77  04/09/23 116/73   Lab Results  Component Value Date   CREATININE 0.89 01/11/2023   CREATININE 0.91 05/25/2022   CREATININE 0.90 02/16/2022   Lab Results  Component Value Date   GFR 90.04 01/11/2023   GFR 88.95 05/25/2022   GFR 90.83 10/13/2021    Plan: Continue all antihypertensives at current dosages. No added salt. Will keep sodium content to 1,500 mg or less per day.     Generalized Obesity: Current BMI BMI (Calculated):  31.37   Pharmacotherapy Plan {dwwmed:29123}  {dwwpharmacotherapy:29109}  Caliph {CHL AMB IS/IS NOT:210130109} currently in the action stage of change. As such, his goal is to {MWMwtloss#1:210800005}.  He has agreed to {dwwsldiets:29085}.  Exercise goals: {MWM EXERCISE RECS:23473}  Behavioral modification strategies: {dwwslwtlossstrategies:29088}.  Oswald has agreed to follow-up with our clinic in 4 weeks.   No orders of the defined types were placed in this encounter.   There are no discontinued medications.   No orders of the defined types were placed in this encounter.     Objective:   VITALS: Per patient if applicable, see vitals. GENERAL: Alert and in no acute distress. CARDIOPULMONARY: No increased WOB. Speaking in clear sentences.  PSYCH: Pleasant and cooperative. Speech normal rate and rhythm. Affect is appropriate. Insight and judgement are appropriate. Attention is focused, linear,  and appropriate.  NEURO: Oriented as arrived to appointment on time with no prompting.   Attestation Statements:   ***(delete if time-based billing not used) Time spent on visit including the items listed below was *** minutes.  -preparing to see the patient (e.g., review of tests, history, previous notes) -obtaining and/or reviewing separately obtained history -counseling and educating the patient/family/caregiver -documenting clinical information in the electronic or other health record -ordering medications, tests, or procedures -independently interpreting results and communicating results to the patient/ family/caregiver -referring and communicating with other health care professionals  -care coordination   This was prepared with the assistance of Engineer, civil (consulting).  Occasional wrong-word or sound-a-like substitutions may have occurred due to the inherent limitations of voice recognition   Corinna Capra, DO

## 2023-06-21 ENCOUNTER — Encounter: Payer: Self-pay | Admitting: Internal Medicine

## 2023-06-21 DIAGNOSIS — E1159 Type 2 diabetes mellitus with other circulatory complications: Secondary | ICD-10-CM

## 2023-06-24 ENCOUNTER — Ambulatory Visit: Admitting: Bariatrics

## 2023-06-24 ENCOUNTER — Other Ambulatory Visit (HOSPITAL_BASED_OUTPATIENT_CLINIC_OR_DEPARTMENT_OTHER): Payer: Self-pay

## 2023-06-24 ENCOUNTER — Encounter: Payer: Self-pay | Admitting: Bariatrics

## 2023-06-24 ENCOUNTER — Other Ambulatory Visit (INDEPENDENT_AMBULATORY_CARE_PROVIDER_SITE_OTHER)

## 2023-06-24 VITALS — BP 108/71 | HR 86 | Temp 98.1°F | Ht 75.0 in | Wt 247.0 lb

## 2023-06-24 DIAGNOSIS — E669 Obesity, unspecified: Secondary | ICD-10-CM | POA: Diagnosis not present

## 2023-06-24 DIAGNOSIS — Z7985 Long-term (current) use of injectable non-insulin antidiabetic drugs: Secondary | ICD-10-CM

## 2023-06-24 DIAGNOSIS — E1159 Type 2 diabetes mellitus with other circulatory complications: Secondary | ICD-10-CM | POA: Diagnosis not present

## 2023-06-24 DIAGNOSIS — E119 Type 2 diabetes mellitus without complications: Secondary | ICD-10-CM | POA: Diagnosis not present

## 2023-06-24 DIAGNOSIS — G4733 Obstructive sleep apnea (adult) (pediatric): Secondary | ICD-10-CM

## 2023-06-24 DIAGNOSIS — Z6830 Body mass index (BMI) 30.0-30.9, adult: Secondary | ICD-10-CM | POA: Diagnosis not present

## 2023-06-24 DIAGNOSIS — E66811 Obesity, class 1: Secondary | ICD-10-CM

## 2023-06-24 LAB — HEMOGLOBIN A1C: Hgb A1c MFr Bld: 6.2 % (ref 4.6–6.5)

## 2023-06-24 MED ORDER — TIRZEPATIDE 12.5 MG/0.5ML ~~LOC~~ SOAJ
12.5000 mg | SUBCUTANEOUS | 0 refills | Status: DC
  Filled 2023-06-24: qty 2, 28d supply, fill #0

## 2023-06-24 NOTE — Progress Notes (Unsigned)
 WEIGHT SUMMARY AND BIOMETRICS  Weight Lost Since Last Visit: 4lb  Weight Gained Since Last Visit: 0   Vitals Temp: 98.1 F (36.7 C) BP: 108/71 Pulse Rate: 86 SpO2: 95 %   Anthropometric Measurements Height: 6\' 3"  (1.905 m) Weight: 247 lb (112 kg) BMI (Calculated): 30.87 Weight at Last Visit: 251lb Weight Lost Since Last Visit: 4lb Weight Gained Since Last Visit: 0 Starting Weight: 262lb Total Weight Loss (lbs): 15 lb (6.804 kg)   Body Composition  Body Fat %: 30.8 % Fat Mass (lbs): 76 lbs Muscle Mass (lbs): 162.6 lbs Total Body Water (lbs): 119.4 lbs Visceral Fat Rating : 17   Other Clinical Data Fasting: no Labs: no Today's Visit #: 10 Starting Date: 08/16/22    OBESITY Toron is here to discuss his progress with his obesity treatment plan along with follow-up of his obesity related diagnoses.    Nutrition Plan: the Category 4 plan - 80% adherence.  Current exercise: walking  Interim History:  He is down 4 lbs since his last visit. He states that he is getting back to normal after his surgery.  Eating all of the food on the plan., Protein intake is as prescribed, Is not skipping meals, and Water intake is adequate.   Pharmacotherapy: Parrish is on Mounjaro 12.5 mg SQ weekly Adverse side effects: {dwwse:29122} Hunger is {EWCONTROLASSESSMENT:24261}.  Cravings are {EWCONTROLASSESSMENT:24261}.  Assessment/Plan:   Type II Diabetes HgbA1c is at goal. Last A1c was 6.2 CBGs: Fasting 130's       Episodes of hypoglycemia: no Medication(s): Mounjaro 12.5 mg SQ weekly  Lab Results  Component Value Date   HGBA1C 6.2 06/24/2023   HGBA1C 6.4 11/02/2022   HGBA1C 7.7 (H) 05/25/2022   Lab Results  Component Value Date   MICROALBUR 1.1 01/11/2023   LDLCALC 46 05/25/2022   CREATININE 0.89 01/11/2023   Lab Results  Component Value Date   GFR 90.04  01/11/2023   GFR 88.95 05/25/2022   GFR 90.83 10/13/2021    Plan: Continue and refill Mounjaro 7.5 mg SQ weekly Continue all other medications.  Will keep all carbohydrates low both sweets and starches.  Will continue exercise regimen to 30 to 60 minutes on most days of the week.  Aim for 7 to 9 hours of sleep nightly.  Eat more low glycemic index foods.  ***.   Obstructive Sleep Apnea Michelle has a diagnosis of sleep apnea. He reports that he {ACTION; IS/IS BJY:78295621} using a CPAP regularly. Reports restful sleep.   Plan: Continue CPAP therapy. Continue to practice good sleep hygiene.  Will add in elements of an antiinflammatory diet and  add in elements of a Mediterranean diet.  Reduce intake of carbohydrates for weight loss.  ***      {dwwmorbid:29108::"Morbid Obesity"}: Current BMI BMI (Calculated): 30.87   Pharmacotherapy Plan {dwwmed:29123}  {dwwpharmacotherapy:29109}  Aeon {CHL AMB IS/IS HYQ:657846962}  currently in the action stage of change. As such, his goal is to {MWMwtloss#1:210800005}.  He has agreed to {dwwsldiets:29085}.  Exercise goals: {MWM EXERCISE RECS:23473}  Behavioral modification strategies: {dwwslwtlossstrategies:29088}.  Daivon has agreed to follow-up with our clinic in {NUMBER 1-10:22536} weeks.     Objective:   VITALS: Per patient if applicable, see vitals. GENERAL: Alert and in no acute distress. CARDIOPULMONARY: No increased WOB. Speaking in clear sentences.  PSYCH: Pleasant and cooperative. Speech normal rate and rhythm. Affect is appropriate. Insight and judgement are appropriate. Attention is focused, linear, and appropriate.  NEURO: Oriented as arrived to appointment on time with no prompting.   Attestation Statements:   ***(delete if time-based billing not used) Time spent on visit including the items listed below was *** minutes.  -preparing to see the patient (e.g., review of tests, history, previous notes) -obtaining and/or  reviewing separately obtained history -counseling and educating the patient/family/caregiver -documenting clinical information in the electronic or other health record -ordering medications, tests, or procedures -independently interpreting results and communicating results to the patient/ family/caregiver -referring and communicating with other health care professionals  -care coordination   This was prepared with the assistance of Engineer, civil (consulting).  Occasional wrong-word or sound-a-like substitutions may have occurred due to the inherent limitations of voice recognition   Kirk Peper, DO

## 2023-06-26 ENCOUNTER — Encounter: Payer: Self-pay | Admitting: Internal Medicine

## 2023-06-27 ENCOUNTER — Other Ambulatory Visit: Payer: Self-pay | Admitting: Internal Medicine

## 2023-06-28 ENCOUNTER — Encounter: Payer: 59 | Admitting: Internal Medicine

## 2023-07-16 HISTORY — PX: MASS EXCISION: SHX2000

## 2023-07-17 ENCOUNTER — Ambulatory Visit: Payer: 59 | Admitting: Cardiovascular Disease

## 2023-07-19 ENCOUNTER — Other Ambulatory Visit: Payer: Self-pay

## 2023-07-19 ENCOUNTER — Encounter: Payer: Self-pay | Admitting: Internal Medicine

## 2023-07-19 ENCOUNTER — Other Ambulatory Visit (HOSPITAL_BASED_OUTPATIENT_CLINIC_OR_DEPARTMENT_OTHER): Payer: Self-pay

## 2023-07-19 ENCOUNTER — Ambulatory Visit (INDEPENDENT_AMBULATORY_CARE_PROVIDER_SITE_OTHER): Payer: 59 | Admitting: Internal Medicine

## 2023-07-19 VITALS — BP 118/66 | HR 64 | Temp 97.9°F | Resp 16 | Ht 75.0 in | Wt 253.4 lb

## 2023-07-19 DIAGNOSIS — Z7985 Long-term (current) use of injectable non-insulin antidiabetic drugs: Secondary | ICD-10-CM

## 2023-07-19 DIAGNOSIS — Z7984 Long term (current) use of oral hypoglycemic drugs: Secondary | ICD-10-CM | POA: Diagnosis not present

## 2023-07-19 DIAGNOSIS — E785 Hyperlipidemia, unspecified: Secondary | ICD-10-CM | POA: Diagnosis not present

## 2023-07-19 DIAGNOSIS — I10 Essential (primary) hypertension: Secondary | ICD-10-CM | POA: Diagnosis not present

## 2023-07-19 DIAGNOSIS — Z0000 Encounter for general adult medical examination without abnormal findings: Secondary | ICD-10-CM

## 2023-07-19 DIAGNOSIS — E1159 Type 2 diabetes mellitus with other circulatory complications: Secondary | ICD-10-CM

## 2023-07-19 DIAGNOSIS — Z0001 Encounter for general adult medical examination with abnormal findings: Secondary | ICD-10-CM

## 2023-07-19 LAB — LIPID PANEL
Cholesterol: 127 mg/dL (ref 0–200)
HDL: 41.7 mg/dL (ref >39.00)
LDL Cholesterol: 64 mg/dL (ref 0–99)
NonHDL: 85.41
Total CHOL/HDL Ratio: 3
Triglycerides: 107 mg/dL (ref 0.0–149.0)
VLDL: 21.4 mg/dL (ref 0.0–40.0)

## 2023-07-19 LAB — COMPREHENSIVE METABOLIC PANEL WITH GFR
ALT: 19 U/L (ref 0–53)
AST: 18 U/L (ref 0–37)
Albumin: 4.2 g/dL (ref 3.5–5.2)
Alkaline Phosphatase: 63 U/L (ref 39–117)
BUN: 22 mg/dL (ref 6–23)
CO2: 27 meq/L (ref 19–32)
Calcium: 9.1 mg/dL (ref 8.4–10.5)
Chloride: 104 meq/L (ref 96–112)
Creatinine, Ser: 0.82 mg/dL (ref 0.40–1.50)
GFR: 91.96 mL/min (ref >60.00)
Glucose, Bld: 118 mg/dL — ABNORMAL HIGH (ref 70–99)
Potassium: 4 meq/L (ref 3.5–5.1)
Sodium: 139 meq/L (ref 135–145)
Total Bilirubin: 0.5 mg/dL (ref 0.2–1.2)
Total Protein: 6.5 g/dL (ref 6.0–8.3)

## 2023-07-19 LAB — PSA: PSA: 1.7 ng/mL (ref 0.10–4.00)

## 2023-07-19 MED ORDER — METFORMIN HCL 850 MG PO TABS
ORAL_TABLET | ORAL | 1 refills | Status: AC
  Filled 2023-07-19: qty 270, 90d supply, fill #0
  Filled 2024-01-07: qty 270, 90d supply, fill #1

## 2023-07-19 MED ORDER — PIOGLITAZONE HCL 45 MG PO TABS
45.0000 mg | ORAL_TABLET | Freq: Every day | ORAL | 1 refills | Status: AC
  Filled 2023-07-19 – 2024-01-07 (×2): qty 90, 90d supply, fill #0

## 2023-07-19 MED ORDER — ROSUVASTATIN CALCIUM 20 MG PO TABS
20.0000 mg | ORAL_TABLET | Freq: Every day | ORAL | 1 refills | Status: DC
  Filled 2023-07-19 – 2024-01-07 (×2): qty 90, 90d supply, fill #0

## 2023-07-19 MED ORDER — LOSARTAN POTASSIUM 25 MG PO TABS
25.0000 mg | ORAL_TABLET | Freq: Every day | ORAL | 1 refills | Status: DC
  Filled 2023-07-19 – 2023-10-11 (×2): qty 90, 90d supply, fill #0
  Filled 2024-01-07: qty 90, 90d supply, fill #1

## 2023-07-19 MED ORDER — HYDROCHLOROTHIAZIDE 25 MG PO TABS
25.0000 mg | ORAL_TABLET | Freq: Every day | ORAL | 1 refills | Status: DC
  Filled 2023-07-19 – 2023-10-11 (×2): qty 90, 90d supply, fill #0
  Filled 2024-01-07: qty 90, 90d supply, fill #1

## 2023-07-19 MED ORDER — ATENOLOL 50 MG PO TABS
25.0000 mg | ORAL_TABLET | Freq: Every day | ORAL | 1 refills | Status: DC
  Filled 2023-07-19 – 2024-01-07 (×2): qty 45, 90d supply, fill #0

## 2023-07-19 NOTE — Progress Notes (Unsigned)
 Subjective:    Patient ID: Anthony [name], male    DOB: 06-20-1955, 68 y.o.   MRN: [id number]  DOS:  07/19/2023 Type of visit - description: CPX  Here for CPX, chronic medical problems addressed. Feels well. Ambulatory BPs range from 112/61 138/90, the great majority of times BPs are in the 120s. When the BP is the lowest he feels slightly dizzy when he stands up. BP today is 118/66 and he feels well.  Ambulatory CBGs: 136 fasting today.  Denies chest pain, difficulty breathing.  No GI or GU symptoms  Review of Systems See above   Past Medical History:  Diagnosis Date   Aortic aneurysm (HCC)    Aortic root enlargement (HCC)    CT 42 mm 2014   Chronic back pain    in 2022 pt states "it ended [place] ago"   Diabetes mellitus    type II   DJD (degenerative joint disease)    per MRI of LS spine 2005   GERD (gastroesophageal reflux disease)    Hyperlipidemia    Hypertension    Hypogonadism male    dx w/ hypogonadism elsewhere ~ 2011   Normal cardiac stress test    (-) stress test 2003, 2006   S/P cardiac cath     04-2006 neg per pt--CP improved w/ PPIs   Sleep apnea    uses cpap   Tachycardia    ER 07-2012, ?SCR, on atenolol     Past Surgical History:  Procedure Laterality Date   CARDIAC CATHETERIZATION     CYSTECTOMY     from back   KNEE ARTHROSCOPY WITH MENISCAL REPAIR Right 2021   MASS EXCISION Left 07/16/2023   L upper arm mass   POLYPECTOMY      Current Outpatient Medications  Medication Instructions   aspirin 81 mg, Daily   atenolol  (TENORMIN ) 25 mg, Oral, Daily   hydrochlorothiazide  (HYDRODIURIL ) 25 mg, Oral, Daily   losartan  (COZAAR ) 25 mg, Oral, Daily   metFORMIN  (GLUCOPHAGE ) 850 MG tablet TAKE 2 TABLETS BY MOUTH DAILY WITH BREAKFAST AND 1 TABLET DAILY WITH SUPPER.   Mounjaro  12.5 mg, Subcutaneous, Weekly   ONETOUCH ULTRA test strip CHECK BLOOD SUGAR NO MORE THAN TWICE DAILY. DX IS E11.9. MAX ON INSURANCE   pioglitazone  (ACTOS ) 45 mg, Oral, Daily    rosuvastatin  (CRESTOR ) 20 mg, Oral, Daily       Objective:   Physical Exam BP 118/66   Pulse 64   Temp 97.9 F (36.6 C) (Oral)   Resp 16   Ht 6\' 3"  (1.905 m)   Wt 253 lb 6 oz (114.9 kg)   SpO2 98%   BMI 31.67 kg/m  General: Well developed, NAD, BMI noted Neck: No  thyromegaly  HEENT:  Normocephalic . Face symmetric, atraumatic Lungs:  CTA B Normal respiratory effort, no intercostal retractions, no accessory muscle use. Heart: RRR,  no murmur.  Abdomen:  Not distended, soft, non-tender. No rebound or rigidity.   DM foot exam: No edema, good pedal pulses, pinprick examination normal Skin: Exposed areas without rash. Not pale. Not jaundice Neurologic:  alert & oriented X3.  Speech normal, gait appropriate for age and unassisted Strength symmetric and appropriate for age.  Psych: Cognition and judgment appear intact.  Cooperative with normal attention span and concentration.  Behavior appropriate. No anxious or depressed appearing.     Assessment    Assessment  DM HTN Hyperlipidemia CV  --Tachycardia : ER eval 2014,   rx atenolol  ---Aorta enlargment per  CT  2014, saw surgery 12-2016, they will cont observation -Calcium  coronary score 169, 03/20/2021, changed Pravachol  to  Crestor  OSA , severe - on Cpap GERD DJD, Chronic back pain,   Hypogonadism DX 2011, 2013:   declined Endo referral + FH Prostate ca brother age 68    PLAN  Here for CPX -Tdap 05/2018 - zostavax 2014, s/p shingrix - pnm shot 2014; prevnar 05-2015 -Vaccines are recommended: Flu shot every fall, PNM 20, COVID booster. -+ FH Prostate cancer: no symptoms, check PSA. -CCS: s/p multiple  Cscopes,   11-2013,   02/2017 + polyps, cscope 10-2020, no polyps, 5 years per report -diet  and exercise: Discussed. -LABS: CMP FLP PSA - Healthcare POA: See AVS Other issues:  DM: Last A1c 6.2,Continue metformin , pioglitazone , Mounjaro .  Feet exam negative today. HTN: See HPI, well-controlled, occasional mild  dizziness, recommend no change.  Atenolol  HCTZ losartan .  Checking labs. High cholesterol: On Crestor , check labs. OSA: Reports good CPAP compliance. RTC 6 months.   DM: Last A1c 7.7, I increased pioglitazone  to 45 mg, subsequently was seen at the [facility] clinic and is now on Mounjaro .  He also takes metformin .  Last A1c 6.4. Diet has improved, less carbohydrates, more vegetables. Praised!  Plan: Check micro otherwise no change, Obesity: Seen at the wellness clinic.  Weight about 6 months ago was 275 pounds, today 248. HTN: Ambulatory BPs 120 or less, at times in the low side (90s) with some dizziness.  Plan: Continue atenolol , losartan , decrease HCTZ to half tablet daily.  Continue decreasing BP meds if needed.  Check BMP. Elevated calcium  coronary score Cardiology OV 07/13/2022, no changes suggested. Vaccine advice provided, patient not inclined to proceed, benefits discussed. RTC 05/2023 CPX

## 2023-07-19 NOTE — Patient Instructions (Addendum)
 Vaccines I recommend, you can get them at our pharmacy downstairs or at your local pharmacy: Covid booster Pneumonia shot (PNM 20) A flu shot every fall    Check the  blood pressure regularly Blood pressure goal:  between 110/65 and  135/85. If it is consistently higher or lower, let me know  Diabetes: You can check your sugars  - early in AM fasting  ( blood sugar goal 70-130) - 2 hours after a meal (blood sugar goal less than 180)   GO TO THE LAB : Get the blood work     Next office visit for a checkup in 6 months Please make an appointment before you leave today Depending on your blood or XRs results it might be necessary to come back soo     "Health Care Power of attorney" (Also know as a  "Living will" or  Advance care planning documents)  If you already have a living will or healthcare power of attorney, is recommended you bring the copy to be scanned in your chart.   The document will be available to all the doctors you see in the system.  If you are over 26 y/o and don't have the document, please read:  Advance care planning is a process that supports adults in  understanding and sharing their preferences regarding future medical care.  The patient's preferences are recorded in documents called Advance Directives and the can be modified at any time while the patient is in full mental capacity.     More information at: [email or web address]

## 2023-07-20 ENCOUNTER — Encounter: Payer: Self-pay | Admitting: Internal Medicine

## 2023-07-20 NOTE — Assessment & Plan Note (Signed)
 Here for CPX  Other issues:  DM: Last A1c 6.2,Continue metformin , pioglitazone , Mounjaro .  Feet exam negative today. HTN: See HPI, well-controlled, occasional mild dizziness, recommend no change.  Cont. Atenolol  HCTZ losartan .  Checking labs. High cholesterol: On Crestor , check labs. OSA: Reports good CPAP compliance. Multiple refills sent. RTC 6 months.

## 2023-07-20 NOTE — Assessment & Plan Note (Signed)
 Here for CPX -Tdap 05/2018 - zostavax 2014, s/p shingrix - pnm shot 2014; prevnar 05-2015 -Vaccines are recommended: Flu shot every fall, PNM 20, COVID booster. -+ FH Prostate cancer: no symptoms, check PSA. -CCS: s/p multiple  Cscopes,   11-2013,   02/2017 + polyps, cscope 10-2020, no polyps, 5 years per report -diet  and exercise: Discussed. -LABS: CMP FLP PSA - Healthcare POA: See AVS

## 2023-07-22 ENCOUNTER — Ambulatory Visit: Admitting: Bariatrics

## 2023-07-22 ENCOUNTER — Encounter: Payer: Self-pay | Admitting: Internal Medicine

## 2023-07-22 ENCOUNTER — Encounter: Payer: Self-pay | Admitting: Bariatrics

## 2023-07-22 ENCOUNTER — Other Ambulatory Visit (HOSPITAL_BASED_OUTPATIENT_CLINIC_OR_DEPARTMENT_OTHER): Payer: Self-pay

## 2023-07-22 VITALS — BP 113/70 | HR 77 | Temp 97.9°F | Ht 75.0 in | Wt 249.0 lb

## 2023-07-22 DIAGNOSIS — E785 Hyperlipidemia, unspecified: Secondary | ICD-10-CM | POA: Diagnosis not present

## 2023-07-22 DIAGNOSIS — E669 Obesity, unspecified: Secondary | ICD-10-CM

## 2023-07-22 DIAGNOSIS — E1159 Type 2 diabetes mellitus with other circulatory complications: Secondary | ICD-10-CM

## 2023-07-22 DIAGNOSIS — Z6831 Body mass index (BMI) 31.0-31.9, adult: Secondary | ICD-10-CM | POA: Diagnosis not present

## 2023-07-22 DIAGNOSIS — E119 Type 2 diabetes mellitus without complications: Secondary | ICD-10-CM

## 2023-07-22 DIAGNOSIS — E6609 Other obesity due to excess calories: Secondary | ICD-10-CM

## 2023-07-22 DIAGNOSIS — Z7985 Long-term (current) use of injectable non-insulin antidiabetic drugs: Secondary | ICD-10-CM

## 2023-07-22 MED ORDER — TIRZEPATIDE 12.5 MG/0.5ML ~~LOC~~ SOAJ
12.5000 mg | SUBCUTANEOUS | 0 refills | Status: DC
  Filled 2023-07-22: qty 2, 28d supply, fill #0

## 2023-07-22 NOTE — Progress Notes (Signed)
 WEIGHT SUMMARY AND BIOMETRICS  Weight Lost Since Last Visit: 0  Weight Gained Since Last Visit: 2lb   Vitals Temp: 97.9 F (36.6 C) BP: 113/70 Pulse Rate: 77 SpO2: 95 %   Anthropometric Measurements Height: 6\' 3"  (1.905 m) Weight: 249 lb (112.9 kg) BMI (Calculated): 31.12 Weight at Last Visit: 247lb Weight Lost Since Last Visit: 0 Weight Gained Since Last Visit: 2lb Starting Weight: 262lb Total Weight Loss (lbs): 13 lb (5.897 kg)   Body Composition  Body Fat %: 31.3 % Fat Mass (lbs): 78 lbs Muscle Mass (lbs): 163 lbs Total Body Water (lbs): 118.8 lbs Visceral Fat Rating : 18   Other Clinical Data Fasting: no Labs: no Today's Visit #: 11 Starting Date: 08/16/22    OBESITY Anthony [name] is here to discuss his progress with his obesity treatment plan along with follow-up of his obesity related diagnoses.    Nutrition Plan: the Category 4 plan - 75% adherence.  Current exercise: walking on treadmill  Interim History:  He gained 2 lbs since his last visit.  According to the bioimpedance test his muscle has gone up some and he has been exercising more.  He is planning to go back to work in the a.m as he has been out of work for 3 months secondary to his previous surgery.  Eating all of the food on the plan., Protein intake is as prescribed, Is not skipping meals, Water intake is adequate., Denies polyphagia, and Denies excessive cravings.   Pharmacotherapy: Anthony [name] is on Mounjaro  12.5 mg SQ weekly Adverse side effects: None Hunger is moderately controlled.  Cravings are moderately controlled.  Assessment/Plan:   Type II Diabetes HgbA1c is at goal. Last A1c was 6.2 Episodes of hypoglycemia: no Medication(s): Zepbound  12.5 mg SQ weekly  Lab Results  Component Value Date   HGBA1C 6.2 06/24/2023   HGBA1C 6.4 11/02/2022   HGBA1C 7.7 (H) 05/25/2022   Lab  Results  Component Value Date   MICROALBUR 1.1 01/11/2023   LDLCALC 64 07/19/2023   CREATININE 0.82 07/19/2023   Lab Results  Component Value Date   GFR 91.96 07/19/2023   GFR 90.04 01/11/2023   GFR 88.95 05/25/2022    Plan: Continue and refill Zepbound  12.5 mg SQ weekly Continue all other medications.  Will keep all carbohydrates low both sweets and starches.  Will continue exercise regimen to 30 to 60 minutes on most days of the week.  Aim for 7 to 9 hours of sleep nightly.  Eat more low glycemic index foods.    Hyperlipidemia LDL is at goal. Medication(s): Crestor   Cardiovascular risk factors: advanced age (older than 34 for men, 52 for women), diabetes mellitus, dyslipidemia, hypertension, male gender, obesity (BMI >= 30 kg/m2), and sedentary lifestyle  Lab Results  Component Value Date   CHOL 127 07/19/2023   HDL 41.70 07/19/2023   LDLCALC 64 07/19/2023  TRIG 107.0 07/19/2023   CHOLHDL 3 07/19/2023   Lab Results  Component Value Date   ALT 19 07/19/2023   AST 18 07/19/2023   ALKPHOS 63 07/19/2023   BILITOT 0.5 07/19/2023   The ASCVD Risk score (Arnett DK, et al., 2019) failed to calculate for the following reasons:   The valid total cholesterol range is 130 to 320 mg/dL  Plan:  Continue statin.  Information sheet on healthy vs unhealthy fats.  Will avoid all trans fats.  Will read labels Will minimize saturated fats except the following: low fat meats in moderation, diary, and limited dark chocolate.  Increase Omega 3 in foods.  Will continue exercise. Will continue to cook at home.    Generalized Obesity: Current BMI BMI (Calculated): 31.12   Pharmacotherapy Plan Continue and refill  Mounjaro  12.5 mg SQ weekly  Anthony [name] is currently in the action stage of change. As such, his goal is to continue with weight loss efforts.  He has agreed to the Category 4 plan.  Exercise goals: Older adults should determine their level of effort for physical activity  relative to their level of fitness.   Behavioral modification strategies: increasing lean protein intake, no meal skipping, meal planning , increase water intake, better snacking choices, planning for success, avoiding temptations, travel eating strategies, and weigh protein portions.  Anthony [name] has agreed to follow-up with our clinic in 4 weeks.       Objective:   VITALS: Per patient if applicable, see vitals. GENERAL: Alert and in no acute distress. CARDIOPULMONARY: No increased WOB. Speaking in clear sentences.  PSYCH: Pleasant and cooperative. Speech normal rate and rhythm. Affect is appropriate. Insight and judgement are appropriate. Attention is focused, linear, and appropriate.  NEURO: Oriented as arrived to appointment on time with no prompting.   Attestation Statements:   This was prepared with the assistance of Engineer, civil (consulting).  Occasional wrong-word or sound-a-like substitutions may have occurred due to the inherent limitations of voice recognition   Kirk Peper, DO

## 2023-08-13 NOTE — Progress Notes (Signed)
 Cardiology Office Note:    Date:  08/23/2023   ID:  [name], DOB June 03, 1955, MRN [id number]  PCP:  Anthony [name], [name]   [facility] Providers Cardiologist:  Anthony [name], [name]     Referring [name]: Anthony [name], [name]    History of Present Illness:    Anthony [name] is a 68 y.o. male with family history of CAD Seen by Anthony [name] for atypical chest pain and dyspnea 03/03/21  Treated for OSA with CPAP.     Brother had CABG in 1998.  He is worried about his family history.  Had normal heart cath in 2008.  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  Anthony. Anthony [name] following as well.  Cardiac CT 03/20/21 reviewed Calcium  score 169 69 th percentile no significant obstructive CAD Aorta meausres 4.3 cm   Aortic MRA 03/28/23 poor images ? 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 Weight down some   Has some [facility] at [facility] for his aneurysm Lost 35 lbs with Mounjaro  plan for CTA cardiac gated 6 months  US  of abdominal aorta 04/16/23 no AAA  Had left carpometacarpal arthroplasty  Anthony [name] 04/24/23 good relief  Looking to slow down his driving to 3 days/week and no weekends  Past Medical History:  Diagnosis Date   Aortic aneurysm (HCC)    Aortic root enlargement (HCC)    CT 42 mm 2014   Chronic back pain    in 2022 pt states it ended Scharrer ago   Diabetes mellitus    type II   DJD (degenerative joint disease)    per MRI of LS spine 2005   GERD (gastroesophageal reflux disease)    Hyperlipidemia    Hypertension    Hypogonadism male    dx w/ hypogonadism elsewhere ~ 2011   Normal cardiac stress test    (-) stress test 2003, 2006   S/P cardiac cath     04-2006 neg per pt--CP improved w/ PPIs   Sleep apnea    uses cpap   Tachycardia    ER 07-2012, ?SCR, on atenolol     Past Surgical History:  Procedure Laterality Date   CARDIAC CATHETERIZATION     CYSTECTOMY     from back   KNEE ARTHROSCOPY WITH MENISCAL REPAIR Right 2021   L hand surgery  04/24/2023    for DJD thumb   MASS EXCISION Left 07/16/2023   L upper arm mass   POLYPECTOMY      Current Medications: Current Meds  Medication Sig   aspirin 81 MG tablet Take 81 mg by mouth daily.   atenolol  (TENORMIN ) 50 MG tablet Take 0.5 tablets (25 mg total) by mouth daily.   hydrochlorothiazide  (HYDRODIURIL ) 25 MG tablet Take 1 tablet (25 mg total) by mouth daily.   losartan  (COZAAR ) 25 MG tablet Take 1 tablet (25 mg total) by mouth daily.   metFORMIN  (GLUCOPHAGE ) 850 MG tablet Take 2 tablets (1,700 mg total) by mouth daily with breakfast AND 1 tablet (850 mg total) daily with supper.   ONETOUCH ULTRA test strip CHECK BLOOD SUGAR NO MORE THAN TWICE DAILY. DX IS E11.9. MAX ON INSURANCE   pioglitazone  (ACTOS ) 45 MG tablet Take 1 tablet (45 mg total) by mouth daily.   rosuvastatin  (CRESTOR ) 20 MG tablet Take 1 tablet (20 mg total) by mouth daily.   tirzepatide  (MOUNJARO ) 12.5 MG/0.5ML Pen Inject 12.5 mg into the skin once a week.  Allergies:   Patient has no known allergies.   Social History   Socioeconomic History   Marital status: Married    Spouse name: Not on file   Number of children: 2   Years of education: Not on file   Highest education level: Not on file  Occupational History   Occupation: TRUCK DRIVER  Tobacco Use   Smoking status: Former    Types: Cigars    Quit date: 03/12/1981    Years since quitting: 42.4   Smokeless tobacco: Never  Vaping Use   Vaping status: Never Used  Substance and Sexual Activity   Alcohol use: Yes    Alcohol/week: 1.0 standard drink of alcohol    Types: 1 Cans of beer per week    Comment: occasionally less than weekly   Drug use: No   Sexual activity: Not on file  Other Topics Concern   Not on file  Social History Narrative   Truck driver (local); lives w/ wife   Social Drivers of Corporate investment banker Strain: Not on file  Food Insecurity: Not on file  Transportation Needs: Not on file  Physical Activity: Not on file   Stress: Not on file  Social Connections: Not on file     Family History: The patient's family history includes Bladder Cancer in his paternal uncle; Colon polyps in his brother; Diabetes in an other family member; Heart disease in his brother and father; Hypertension in his father; Prostate cancer (age of onset: 56) in his brother; Sinusitis in his brother; Stomach cancer in his paternal uncle. There is no history of Colon cancer, Rectal cancer, or Esophageal cancer.  ROS:   Please see the history of present illness.    No fevers chills nausea vomiting syncope bleeding all other systems reviewed and are negative.  EKGs/Labs/Other Studies Reviewed:    Cardiac CTA 04/19/2021 see HPI Echo 09/06/15: Study Conclusions   - Left ventricle: The cavity size was normal. There was mild focal    basal hypertrophy of the septum. Systolic function was vigorous.    The estimated ejection fraction was in the range of 65% to 70%.    Wall motion was normal; there were no regional wall motion    abnormalities. Features are consistent with a pseudonormal left    ventricular filling pattern, with concomitant abnormal relaxation    and increased filling pressure (grade 2 diastolic dysfunction).  - Aorta: Aortic root dimension: 44 mm (ED) sinus of Valsalva.  - Aortic root: The aortic root was moderately dilated.  - Left atrium: The atrium was mildly dilated. Volume/bsa, ES,    (1-plane Simpson&'s, A2C): 35.8 ml/m^2.  - Right ventricle: The cavity size was mildly dilated. Wall    thickness was normal.   Impressions:   - Previous echo 2015 - aortic root 43mm. No significant change.   EKG:  08/23/2023 SR rate 71 normal other than PR 216 msec   Recent Labs: 01/11/2023: Hemoglobin 12.9; Platelets 287.0 07/19/2023: ALT 19; BUN 22; Creatinine, Ser 0.82; Potassium 4.0; Sodium 139  Recent Lipid Panel    Component Value Date/Time   CHOL 127 07/19/2023 0854   TRIG 107.0 07/19/2023 0854   HDL 41.70 07/19/2023  0854   CHOLHDL 3 07/19/2023 0854   VLDL 21.4 07/19/2023 0854   LDLCALC 64 07/19/2023 0854       Physical Exam:    VS:  BP 116/78   Pulse 74   Ht 6' 3 (1.905 m)   Wt 256 lb  6.4 oz (116.3 kg)   SpO2 97%   BMI 32.05 kg/m     Wt Readings from Last 3 Encounters:  08/23/23 256 lb 6.4 oz (116.3 kg)  07/22/23 249 lb (112.9 kg)  07/19/23 253 lb 6 oz (114.9 kg)     Affect appropriate Healthy:  appears stated age HEENT: normal Neck supple with no adenopathy JVP normal no bruits no thyromegaly Lungs clear with no wheezing and good diaphragmatic motion Heart:  S1/S2 no murmur, no rub, gallop or click PMI normal Abdomen: benighn, BS positve, no tenderness, no AAA no bruit.  No HSM or HJR Distal pulses intact with no bruits No edema Neuro non-focal Skin warm and dry No muscular weakness   PLAN:    Chest Pain:  non cardiac cardiac CTA 03/20/21 CAD RADS 1  HLD:  changed to crestor  given high calcium  score LDL 70 Aortic Aneurysm:  Sinus 48 mm stable f/u Anthony [name]  HTN:  Well controlled.  Continue current medications and low sodium Dash type diet.   DM:  Discussed low carb diet.  Target hemoglobin A1c is 6.5 or less.  Continue current medications. Ortho:  post left carpal surgery with Anthony [name] 04/2023    Cardiac CTA July 2025 per CVTS BMET prior   F/U in a year   Anthony [name] [facility]

## 2023-08-19 ENCOUNTER — Other Ambulatory Visit: Payer: Self-pay | Admitting: Thoracic Surgery (Cardiothoracic Vascular Surgery)

## 2023-08-19 DIAGNOSIS — I7121 Aneurysm of the ascending aorta, without rupture: Secondary | ICD-10-CM

## 2023-08-23 ENCOUNTER — Encounter: Payer: Self-pay | Admitting: Cardiovascular Disease

## 2023-08-23 ENCOUNTER — Ambulatory Visit: Payer: 59 | Attending: Cardiovascular Disease | Admitting: Cardiovascular Disease

## 2023-08-23 VITALS — BP 116/78 | HR 74 | Ht 75.0 in | Wt 256.4 lb

## 2023-08-23 DIAGNOSIS — I4719 Other supraventricular tachycardia: Secondary | ICD-10-CM | POA: Diagnosis not present

## 2023-08-23 DIAGNOSIS — I10 Essential (primary) hypertension: Secondary | ICD-10-CM | POA: Diagnosis not present

## 2023-08-23 DIAGNOSIS — I7121 Aneurysm of the ascending aorta, without rupture: Secondary | ICD-10-CM

## 2023-08-23 NOTE — Patient Instructions (Signed)

## 2023-08-27 ENCOUNTER — Ambulatory Visit: Admitting: Bariatrics

## 2023-08-27 ENCOUNTER — Encounter: Payer: Self-pay | Admitting: Bariatrics

## 2023-08-27 ENCOUNTER — Other Ambulatory Visit (HOSPITAL_BASED_OUTPATIENT_CLINIC_OR_DEPARTMENT_OTHER): Payer: Self-pay

## 2023-08-27 VITALS — BP 110/70 | HR 73 | Temp 98.2°F | Ht 75.0 in | Wt 255.0 lb

## 2023-08-27 DIAGNOSIS — E669 Obesity, unspecified: Secondary | ICD-10-CM

## 2023-08-27 DIAGNOSIS — E6609 Other obesity due to excess calories: Secondary | ICD-10-CM

## 2023-08-27 DIAGNOSIS — Z7985 Long-term (current) use of injectable non-insulin antidiabetic drugs: Secondary | ICD-10-CM

## 2023-08-27 DIAGNOSIS — I10 Essential (primary) hypertension: Secondary | ICD-10-CM | POA: Diagnosis not present

## 2023-08-27 DIAGNOSIS — Z6831 Body mass index (BMI) 31.0-31.9, adult: Secondary | ICD-10-CM

## 2023-08-27 DIAGNOSIS — E119 Type 2 diabetes mellitus without complications: Secondary | ICD-10-CM

## 2023-08-27 DIAGNOSIS — E1159 Type 2 diabetes mellitus with other circulatory complications: Secondary | ICD-10-CM

## 2023-08-27 MED ORDER — TIRZEPATIDE 12.5 MG/0.5ML ~~LOC~~ SOAJ
12.5000 mg | SUBCUTANEOUS | 0 refills | Status: DC
  Filled 2023-08-27: qty 2, 28d supply, fill #0

## 2023-08-27 NOTE — Progress Notes (Signed)
 WEIGHT SUMMARY AND BIOMETRICS  Weight Lost Since Last Visit: 0  Weight Gained Since Last Visit: 6lb   Vitals Temp: 98.2 F (36.8 C) BP: 110/70 Pulse Rate: 73 SpO2: 98 %   Anthropometric Measurements Height: 6' 3 (1.905 m) Weight: 255 lb (115.7 kg) BMI (Calculated): 31.87 Weight at Last Visit: 249lb Weight Lost Since Last Visit: 0 Weight Gained Since Last Visit: 6lb Starting Weight: 262lb Total Weight Loss (lbs): 7 lb (3.175 kg)   Body Composition  Body Fat %: 32.8 % Fat Mass (lbs): 84 lbs Muscle Mass (lbs): 163.4 lbs Total Body Water (lbs): 125.4 lbs Visceral Fat Rating : 19   Other Clinical Data Fasting: no Labs: no Today's Visit #: 12 Starting Date: 08/16/22    OBESITY Michaeljohn is here to discuss his progress with his obesity treatment plan along with follow-up of his obesity related diagnoses.    Nutrition Plan: the Category 4 plan - 70% adherence.  Current exercise: none  Interim History:  He is up 6 lbs since his last visit. He recently started back to work and is still not back into his routine.  The bioimpedance scale shows that his weight is mostly water. Eating all of the food on the plan., Protein intake is as prescribed, Is not skipping meals, and Water intake is inadequate.   Pharmacotherapy: Greydon is on Mounjaro  12.5 mg SQ weekly Adverse side effects: None Hunger is moderately controlled.  Cravings are moderately controlled.  Assessment/Plan:   Type II Diabetes HgbA1c is at goal. Last A1c was 6.2 Episodes of hypoglycemia: no Medication(s): Mounjaro  12.5 mg SQ weekly  Lab Results  Component Value Date   HGBA1C 6.2 06/24/2023   HGBA1C 6.4 11/02/2022   HGBA1C 7.7 (H) 05/25/2022   Lab Results  Component Value Date   MICROALBUR 1.1 01/11/2023   LDLCALC 64 07/19/2023   CREATININE 0.82 07/19/2023   Lab Results  Component  Value Date   GFR 91.96 07/19/2023   GFR 90.04 01/11/2023   GFR 88.95 05/25/2022    Plan: Continue and refill Mounjaro  12.5 mg SQ weekly Will increase his water and decrease his eating out. Will keep all carbohydrates low both sweets and starches.  Will continue exercise regimen to 30 to 60 minutes on most days of the week.  Aim for 7 to 9 hours of sleep nightly.  Eat more low glycemic index foods.   Hypertension Hypertension stable.  Medication(s): Cozaar  25 mg daily and Hydrochlorothiazide  25 mg daily   BP Readings from Last 3 Encounters:  08/27/23 110/70  08/23/23 116/78  07/22/23 113/70   Lab Results  Component Value Date   CREATININE 0.82 07/19/2023   CREATININE 0.89 01/11/2023   CREATININE 0.91 05/25/2022   Lab Results  Component Value Date   GFR 91.96 07/19/2023   GFR 90.04 01/11/2023   GFR 88.95 05/25/2022    Plan: Continue  all antihypertensives at current dosages. No added salt. Will keep sodium content to 1,500 mg or less per day.  He will increase his water. He will avoid eating out as much and more eat more at home.   Generalized Obesity: Current BMI BMI (Calculated): 31.87   Pharmacotherapy Plan Continue and refill  Mounjaro  12.5 mg SQ weekly  Shomari is currently in the action stage of change. As such, his goal is to continue with weight loss efforts.  He has agreed to the Category 4 plan.  Exercise goals: Older adults should follow the adult guidelines. When older adults cannot meet the adult guidelines, they should be as physically active as their abilities and conditions will allow.   Behavioral modification strategies: increasing lean protein intake, decreasing simple carbohydrates , decrease eating out, meal planning , increase water intake, better snacking choices, and planning for success.  Butler has agreed to follow-up with our clinic in 4 weeks.    Objective:   VITALS: Per patient if applicable, see vitals. GENERAL: Alert and in no acute  distress. CARDIOPULMONARY: No increased WOB. Speaking in clear sentences.  PSYCH: Pleasant and cooperative. Speech normal rate and rhythm. Affect is appropriate. Insight and judgement are appropriate. Attention is focused, linear, and appropriate.  NEURO: Oriented as arrived to appointment on time with no prompting.   Attestation Statements:   This was prepared with the assistance of Engineer, civil (consulting).  Occasional wrong-word or sound-a-like substitutions may have occurred due to the inherent limitations of voice recognition   Kirk Peper, DO

## 2023-08-30 ENCOUNTER — Ambulatory Visit (HOSPITAL_COMMUNITY)
Admission: RE | Admit: 2023-08-30 | Discharge: 2023-08-30 | Disposition: A | Discharge status: Home / Self Care | Source: Ambulatory Visit | Attending: Thoracic Surgery (Cardiothoracic Vascular Surgery) | Admitting: Thoracic Surgery (Cardiothoracic Vascular Surgery)

## 2023-08-30 DIAGNOSIS — I700 Atherosclerosis of aorta: Secondary | ICD-10-CM | POA: Insufficient documentation

## 2023-08-30 DIAGNOSIS — I7121 Aneurysm of the ascending aorta, without rupture: Secondary | ICD-10-CM | POA: Diagnosis present

## 2023-08-30 DIAGNOSIS — I2510 Atherosclerotic heart disease of native coronary artery without angina pectoris: Secondary | ICD-10-CM | POA: Diagnosis not present

## 2023-08-30 MED ORDER — IOHEXOL 350 MG/ML SOLN
100.0000 mL | Freq: Once | INTRAVENOUS | Status: AC | PRN
Start: 2023-08-30 — End: 2023-08-30
  Administered 2023-08-30: 100 mL via INTRAVENOUS

## 2023-09-20 ENCOUNTER — Encounter: Payer: Self-pay | Admitting: Bariatrics

## 2023-09-23 ENCOUNTER — Other Ambulatory Visit (HOSPITAL_BASED_OUTPATIENT_CLINIC_OR_DEPARTMENT_OTHER): Payer: Self-pay

## 2023-09-23 ENCOUNTER — Other Ambulatory Visit: Payer: Self-pay | Admitting: Bariatrics

## 2023-09-23 MED ORDER — TIRZEPATIDE 12.5 MG/0.5ML ~~LOC~~ SOAJ
12.5000 mg | SUBCUTANEOUS | 0 refills | Status: DC
  Filled 2023-09-23: qty 2, 28d supply, fill #0

## 2023-10-01 ENCOUNTER — Other Ambulatory Visit (HOSPITAL_BASED_OUTPATIENT_CLINIC_OR_DEPARTMENT_OTHER): Payer: Self-pay

## 2023-10-01 ENCOUNTER — Ambulatory Visit: Admitting: Bariatrics

## 2023-10-01 ENCOUNTER — Encounter: Payer: Self-pay | Admitting: Bariatrics

## 2023-10-01 VITALS — BP 103/76 | HR 75 | Temp 97.6°F | Ht 75.0 in | Wt 257.0 lb

## 2023-10-01 DIAGNOSIS — Z6832 Body mass index (BMI) 32.0-32.9, adult: Secondary | ICD-10-CM

## 2023-10-01 DIAGNOSIS — Z7985 Long-term (current) use of injectable non-insulin antidiabetic drugs: Secondary | ICD-10-CM | POA: Diagnosis not present

## 2023-10-01 DIAGNOSIS — E1159 Type 2 diabetes mellitus with other circulatory complications: Secondary | ICD-10-CM

## 2023-10-01 DIAGNOSIS — E669 Obesity, unspecified: Secondary | ICD-10-CM | POA: Diagnosis not present

## 2023-10-01 DIAGNOSIS — E6609 Other obesity due to excess calories: Secondary | ICD-10-CM

## 2023-10-01 NOTE — Progress Notes (Signed)
 WEIGHT SUMMARY AND BIOMETRICS  Weight Lost Since Last Visit: 0  Weight Gained Since Last Visit: 2lb   Vitals Temp: 97.6 F (36.4 C) BP: 103/76 Pulse Rate: 75 SpO2: 99 %   Anthropometric Measurements Height: 6' 3 (1.905 m) Weight: 257 lb (116.6 kg) BMI (Calculated): 32.12 Weight at Last Visit: 255lb Weight Lost Since Last Visit: 0 Weight Gained Since Last Visit: 2lb Starting Weight: 262lb Total Weight Loss (lbs): 5 lb (2.268 kg)   Body Composition  Body Fat %: 33.2 % Fat Mass (lbs): 85.6 lbs Muscle Mass (lbs): 163.8 lbs Total Body Water (lbs): 126.4 lbs Visceral Fat Rating : 19   Other Clinical Data Fasting: no Labs: no Today's Visit #: 13 Starting Date: 08/16/22    OBESITY Anthony [name] is here to discuss his progress with his obesity treatment plan along with follow-up of his obesity related diagnoses.    Nutrition Plan: the Category 4 plan - 75% adherence.  Current exercise: walking  Interim History:  He is up 2 pounds ,but he has been on vacation since his last visit. Eating all of the food on the plan., Protein intake is as prescribed, Is not skipping meals, Not journaling consistently., and Water intake is inadequate.   Pharmacotherapy: Anthony [name] is on Mounjaro  12.5 mg SQ weekly, he is also on metformin  850 mg tablet daily with breakfast and supper. Adverse side effects: None Hunger is moderately controlled.  Cravings are moderately controlled.  Assessment/Plan:   Type II Diabetes HgbA1c is at goal. Last A1c was 6.2 CBGs: Fasting 130's      Episodes of hypoglycemia: no Medication(s): Mounjaro  12.5 mg SQ weekly  Lab Results  Component Value Date   HGBA1C 6.2 06/24/2023   HGBA1C 6.4 11/02/2022   HGBA1C 7.7 (H) 05/25/2022   Lab Results  Component Value Date   MICROALBUR 0.5 11/05/2008   LDLCALC 64 07/19/2023   CREATININE 0.82 07/19/2023    Lab Results  Component Value Date   GFR 91.96 07/19/2023   GFR 90.04 01/11/2023   GFR 88.95 05/25/2022    Plan: Continue Zepbound  12.5 mg SQ weekly Will keep all carbohydrates low both sweets and starches.  Will continue exercise regimen to 30 to 60 minutes on most days of the week.  Aim for 7 to 9 hours of sleep nightly.  Eat more low glycemic index foods.  He will increase his water.  He will eat more at home.  He will return to the gym on a regular basis.    Generalized Obesity: Current BMI BMI (Calculated): 32.12   Pharmacotherapy Plan Continue  Mounjaro  12.5 mg SQ weekly  Anthony [name] is currently in the action stage of change. As such, his goal is to continue with weight loss efforts.  He has agreed to the Category 4 plan.  Exercise goals: Older adults should determine their level of effort for physical activity relative  to their level of fitness.   Behavioral modification strategies: increasing lean protein intake, no meal skipping, meal planning , increase water intake, better snacking choices, planning for success, increasing vegetables, and avoiding temptations.  Anthony [name] has agreed to follow-up with our clinic in 4 weeks.      Objective:   VITALS: Per patient if applicable, see vitals. GENERAL: Alert and in no acute distress. CARDIOPULMONARY: No increased WOB. Speaking in clear sentences.  PSYCH: Pleasant and cooperative. Speech normal rate and rhythm. Affect is appropriate. Insight and judgement are appropriate. Attention is focused, linear, and appropriate.  NEURO: Oriented as arrived to appointment on time with no prompting.   Attestation Statements:   This was prepared with the assistance of Engineer, civil (consulting).  Occasional wrong-word or sound-a-like substitutions may have occurred due to the inherent limitations of voice recognition   Clayborne Daring, DO

## 2023-10-02 ENCOUNTER — Other Ambulatory Visit (HOSPITAL_BASED_OUTPATIENT_CLINIC_OR_DEPARTMENT_OTHER): Payer: Self-pay

## 2023-10-02 MED ORDER — ROSUVASTATIN CALCIUM 20 MG PO TABS
20.0000 mg | ORAL_TABLET | Freq: Every day | ORAL | 0 refills | Status: DC
  Filled 2023-10-11: qty 90, 90d supply, fill #0

## 2023-10-02 MED ORDER — ATENOLOL 50 MG PO TABS
25.0000 mg | ORAL_TABLET | Freq: Every day | ORAL | 0 refills | Status: DC
  Filled 2023-10-11: qty 45, 90d supply, fill #0

## 2023-10-02 MED ORDER — LOSARTAN POTASSIUM 25 MG PO TABS
25.0000 mg | ORAL_TABLET | Freq: Every day | ORAL | 0 refills | Status: DC
  Filled 2023-10-11 – 2024-01-07 (×2): qty 90, 90d supply, fill #0

## 2023-10-08 ENCOUNTER — Ambulatory Visit
Attending: Thoracic Surgery (Cardiothoracic Vascular Surgery) | Admitting: Thoracic Surgery (Cardiothoracic Vascular Surgery)

## 2023-10-08 ENCOUNTER — Encounter: Payer: Self-pay | Admitting: Thoracic Surgery (Cardiothoracic Vascular Surgery)

## 2023-10-08 VITALS — BP 112/68 | HR 72 | Resp 20 | Ht 75.0 in | Wt 268.0 lb

## 2023-10-08 DIAGNOSIS — I7121 Aneurysm of the ascending aorta, without rupture: Secondary | ICD-10-CM

## 2023-10-08 NOTE — Progress Notes (Signed)
 [address], [place] [address]             [phone]     HPI: Mr. 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  Anthony [name] is a 68 year old man with a history of hypertension, hyperlipidemia, type 2 diabetes, reflux, sleep apnea, and a sinus of Valsalva aneurysm.  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  Denies chest pain, pressure, tightness, or shortness of breath.  Diabetes has been well-controlled.  Most recent A1c was 6.2.  Blood pressure has been well-controlled.  Had significant weight loss in past, but has leveled off recently.  Past Medical History:  Diagnosis Date   Aortic aneurysm (HCC)    Aortic root enlargement (HCC)    CT 42 mm 2014   Chronic back pain    in 2022 pt states it ended [place] ago   Diabetes mellitus    type II   DJD (degenerative joint disease)    per MRI of LS spine 2005   GERD (gastroesophageal reflux disease)    Hyperlipidemia    Hypertension    Hypogonadism male    dx w/ hypogonadism elsewhere ~ 2011   Normal cardiac stress test    (-) stress test 2003, 2006   S/P cardiac cath     04-2006 neg per pt--CP improved w/ PPIs   Sleep apnea    uses cpap   Tachycardia    ER 07-2012, ?SCR, on atenolol     Current Outpatient Medications  Medication Sig Dispense Refill   aspirin 81 MG tablet Take 81 mg by mouth daily.     atenolol  (TENORMIN ) 50 MG tablet Take 0.5 tablets (25 mg total) by mouth daily. 45 tablet 1   atenolol  (TENORMIN ) 50 MG tablet Take 0.5 tablets (25 mg total) by mouth daily. 45 tablet 0   hydrochlorothiazide  (HYDRODIURIL ) 25 MG tablet Take 1 tablet (25 mg total) by mouth daily. 90 tablet 1   losartan  (COZAAR ) 25 MG tablet Take 1 tablet (25 mg total) by mouth daily. 90 tablet 1   losartan  (COZAAR ) 25 MG tablet Take 1 tablet (25 mg total) by mouth daily. 90 tablet 0   metFORMIN  (GLUCOPHAGE ) 850 MG tablet Take 2 tablets (1,700 mg total) by mouth daily with breakfast AND 1 tablet (850  mg total) daily with supper. 270 tablet 1   ONETOUCH ULTRA test strip CHECK BLOOD SUGAR NO MORE THAN TWICE DAILY. DX IS E11.9. MAX ON INSURANCE 100 strip 12   pioglitazone  (ACTOS ) 45 MG tablet Take 1 tablet (45 mg total) by mouth daily. 90 tablet 1   rosuvastatin  (CRESTOR ) 20 MG tablet Take 1 tablet (20 mg total) by mouth daily. 90 tablet 1   rosuvastatin  (CRESTOR ) 20 MG tablet Take 1 tablet (20 mg total) by mouth daily. 90 tablet 0   tirzepatide  (MOUNJARO ) 12.5 MG/0.5ML Pen Inject 12.5 mg into the skin once a week. 2 mL 0   No current facility-administered medications for this visit.    Physical Exam BP 112/68   Pulse 72   Resp 20   Ht 6' 3 (1.905 m)   Wt 268 lb (121.6 kg)   SpO2 96% Comment: RA  BMI 33.64 kg/m  68 year old man in no acute distress Alert and oriented x 3 with no focal deficits Lungs clear Cardiac regular rate and rhythm with a normal S1 and S2 no rubs murmurs or gallops Pulses intact No peripheral edema No carotid bruits  Diagnostic Tests: CT ANGIOGRAPHY CHEST WITH CONTRAST   TECHNIQUE: Multidetector CT imaging of the chest was performed using the standard protocol during bolus administration of intravenous contrast. Multiplanar CT image reconstructions and MIPs were obtained to evaluate the vascular anatomy.   RADIATION DOSE REDUCTION: This exam was performed according to the departmental dose-optimization program which includes automated exposure control, adjustment of the mA and/or kV according to patient size and/or use of iterative reconstruction technique.   CONTRAST:  OMNIPAQUE  IOHEXOL  350 MG/ML SOLN   COMPARISON:  MRI 04/05/2023.   FINDINGS: Cardiovascular: At the level of the sinuses the aorta measures 4.6 cm, image 107/601. This is unchanged from 02/16/2022. At the sinotubular junction the aorta measures 3.3 cm, image 107/601. Unchanged. The ascending thoracic aorta measures 3.8 cm, stable from prior imaging, image 110/601. Heart  size is mildly enlarged coronary artery atherosclerotic calcifications. No pericardial effusion.   Mediastinum/Nodes: No enlarged mediastinal, hilar, or axillary lymph nodes. Thyroid  gland, trachea, and esophagus demonstrate no significant findings.   Lungs/Pleura: No pleural effusion. No airspace consolidation, atelectasis or pneumothorax.   Upper Abdomen: No acute abnormality.   Musculoskeletal: No chest wall abnormality. No acute or significant osseous findings.   Review of the MIP images confirms the above findings.   IMPRESSION: 1. Stable dilatation of the aortic root measuring up to 4.6 cm. The ascending thoracic aorta has a normal caliber. 2. Coronary artery calcifications. 3.  Aortic Atherosclerosis (ICD10-I70.0).     Electronically Signed   By: Waddell Calk M.D.   On: 09/08/2023 11:34 I personally reviewed the CT images.  4.6 to 4.7 cm sinus of Valsalva aneurysm.  Normal aorta above sinotubular junction.  Aortic and coronary atherosclerosis.  Impression: Anthony [name] is a 68 year old man with a history of hypertension, hyperlipidemia, type 2 diabetes, reflux, sleep apnea, and a sinus of Valsalva aneurysm.  Sinus of Valsalva aneurysm-stable at 4.6 cm.  Will plan a repeat CT in 6 months.    Hypertension-blood pressure well-controlled with current regimen.  Coronary atherosclerosis-mild by coronary CT in 2023.  Asymptomatic.  On appropriate medical regimen.  Plan: Return in 6 months with cardiac gated CT angio chest  Elspeth JAYSON Millers, MD [facility] [phone]

## 2023-10-11 ENCOUNTER — Other Ambulatory Visit (HOSPITAL_BASED_OUTPATIENT_CLINIC_OR_DEPARTMENT_OTHER): Payer: Self-pay

## 2023-10-31 ENCOUNTER — Ambulatory Visit: Admitting: Bariatrics

## 2023-10-31 ENCOUNTER — Other Ambulatory Visit (HOSPITAL_BASED_OUTPATIENT_CLINIC_OR_DEPARTMENT_OTHER): Payer: Self-pay

## 2023-10-31 ENCOUNTER — Encounter: Payer: Self-pay | Admitting: Bariatrics

## 2023-10-31 VITALS — BP 104/66 | HR 81 | Temp 98.0°F | Ht 75.0 in | Wt 256.0 lb

## 2023-10-31 DIAGNOSIS — E119 Type 2 diabetes mellitus without complications: Secondary | ICD-10-CM | POA: Diagnosis not present

## 2023-10-31 DIAGNOSIS — G4733 Obstructive sleep apnea (adult) (pediatric): Secondary | ICD-10-CM

## 2023-10-31 DIAGNOSIS — E669 Obesity, unspecified: Secondary | ICD-10-CM

## 2023-10-31 DIAGNOSIS — Z6832 Body mass index (BMI) 32.0-32.9, adult: Secondary | ICD-10-CM | POA: Diagnosis not present

## 2023-10-31 DIAGNOSIS — E6609 Other obesity due to excess calories: Secondary | ICD-10-CM

## 2023-10-31 DIAGNOSIS — E1159 Type 2 diabetes mellitus with other circulatory complications: Secondary | ICD-10-CM

## 2023-10-31 DIAGNOSIS — Z7985 Long-term (current) use of injectable non-insulin antidiabetic drugs: Secondary | ICD-10-CM

## 2023-10-31 MED ORDER — TIRZEPATIDE 12.5 MG/0.5ML ~~LOC~~ SOAJ
12.5000 mg | SUBCUTANEOUS | 0 refills | Status: DC
  Filled 2023-10-31: qty 2, 28d supply, fill #0

## 2023-10-31 NOTE — Progress Notes (Signed)
 WEIGHT SUMMARY AND BIOMETRICS  Weight Lost Since Last Visit: 1lb  Weight Gained Since Last Visit: 0   Vitals Temp: 98 F (36.7 C) BP: 104/66 Pulse Rate: 81 SpO2: 95 %   Anthropometric Measurements Height: 6' 3 (1.905 m) Weight: 256 lb (116.1 kg) BMI (Calculated): 32 Weight at Last Visit: 257lb Weight Lost Since Last Visit: 1lb Weight Gained Since Last Visit: 0 Starting Weight: 262lb Total Weight Loss (lbs): 6 lb (2.722 kg)   Body Composition  Body Fat %: 32.8 % Fat Mass (lbs): 84 lbs Muscle Mass (lbs): 163.8 lbs Total Body Water (lbs): 125.6 lbs Visceral Fat Rating : 19   Other Clinical Data Fasting: no Labs: no Today's Visit #: 14 Starting Date: 08/16/22    OBESITY Anthony [name] is here to discuss his progress with his obesity treatment plan along with follow-up of his obesity related diagnoses.    Nutrition Plan: the Category 4 plan - 75% adherence.  Current exercise: Walking  Interim History:  He is down 1 lb since his last visit. He fluctuates between   He is cooking more at home.  Eating all of the food on the plan., Protein intake is as prescribed, Is not skipping meals, Not journaling consistently., and Water intake is adequate.   Pharmacotherapy: Anthony [name] is on Mounjaro  12.5 mg SQ weekly Adverse side effects: None Hunger is moderately controlled.  Cravings are moderately controlled.  Assessment/Plan:   Type II Diabetes HgbA1c is at goal. Last A1c was 6.2 CBGs: Fasting 130's      Episodes of hypoglycemia: no Medication(s): Mounjaro  12.5 mg SQ weekly  Lab Results  Component Value Date   HGBA1C 6.2 06/24/2023   HGBA1C 6.4 11/02/2022   HGBA1C 7.7 (H) 05/25/2022   Lab Results  Component Value Date   MICROALBUR 0.5 11/05/2008   LDLCALC 64 07/19/2023   CREATININE 0.82 07/19/2023   Lab Results  Component Value Date   GFR 91.96  07/19/2023   GFR 90.04 01/11/2023   GFR 88.95 05/25/2022    Plan: Continue and refill Mounjaro  12.5 mg SQ weekly Continue all other medications.  Will keep all carbohydrates low both sweets and starches.  Will continue exercise regimen to 30 to 60 minutes on most days of the week.  Aim for 7 to 9 hours of sleep nightly.  Eat more low glycemic index foods.  He will resume journaling on a regular basis. He will cook more meals at home.   Obstructive Sleep Apnea Anthony [name] has a diagnosis of sleep apnea. He reports that he is using a CPAP regularly. Reports restful sleep.   Plan: Continue CPAP therapy. Continue to practice good sleep hygiene.  Will add in elements of an antiinflammatory diet and  add in elements of a Mediterranean diet.  Reduce intake of carbohydrates for weight loss.  He will start journaling on a regular basis and will eat  around 17 to 1800 cal and 100 260 g of protein daily. He will use My Fitness Pal on a regular basis.     Generalized Obesity: Current BMI BMI (Calculated): 32   Pharmacotherapy Plan Continue and refill  Mounjaro  12.5 mg SQ weekly  Anthony [name] is currently in the action stage of change. As such, his goal is to continue with weight loss efforts.  He has agreed to the Category 4 plan and keeping a food journal with goal of 1,700 to 1,800 calories and 100 to 160 grams of protein daily.  Exercise goals: Older adults should determine their level of effort for physical activity relative to their level of fitness.   Behavioral modification strategies: increasing lean protein intake, no meal skipping, meal planning , better snacking choices, planning for success, increasing vegetables, avoiding temptations, keep healthy foods in the home, weigh protein portions, and mindful eating.  Anthony [name] has agreed to follow-up with our clinic in 4 weeks.  Will do an indirect calorimetry test at his next visit.    Objective:   VITALS: Per patient if applicable, see  vitals. GENERAL: Alert and in no acute distress. CARDIOPULMONARY: No increased WOB. Speaking in clear sentences.  PSYCH: Pleasant and cooperative. Speech normal rate and rhythm. Affect is appropriate. Insight and judgement are appropriate. Attention is focused, linear, and appropriate.  NEURO: Oriented as arrived to appointment on time with no prompting.   Attestation Statements:   This was prepared with the assistance of Engineer, civil (consulting).  Occasional wrong-word or sound-a-like substitutions may have occurred due to the inherent limitations of voice recognition   Clayborne Daring, DO

## 2023-11-07 NOTE — Progress Notes (Unsigned)
 Ben Jackson D.[facility] [address] [place] [address] Phone: [phone]   Assessment and Plan:     1. Chronic pain of both shoulders (Primary) -Chronic with exacerbation, initial visit - Intermittent flaring of bilateral shoulder pain, becoming more consistent over the past 2 to 3 months.  Worse in right shoulder compared to left.  Most consistent with biceps tendinitis of right shoulder and bilateral subacromial bursitis - Start meloxicam  15 mg daily x2 weeks.  If still having pain after 2 weeks, complete 3rd-week of NSAID. May use remaining NSAID as needed once daily for pain control.  Do not to use additional over-the-counter NSAIDs (ibuprofen, naproxen, Advil, Aleve, etc.) while taking prescription NSAIDs.  May use Tylenol [phone] mg 2 to 3 times a day for breakthrough pain. - Start HEP for rotator cuff  15 additional minutes spent for educating Therapeutic Home Exercise Program.  This included exercises focusing on stretching, strengthening, with focus on eccentric aspects.   Fite term goals include an improvement in range of motion, strength, endurance as well as avoiding reinjury. Patient's frequency would include in 1-2 times a day, 3-5 times a week for a duration of 6-12 weeks. Proper technique shown and discussed handout in great detail with ATC.  All questions were discussed and answered.      Pertinent previous records reviewed include none   Follow Up: 4 weeks for reevaluation.  If no improvement or worsening of symptoms, could consider subacromial CSI versus biceps tendon CSI.  Could obtain bilateral shoulder x-rays.  Could add trapezius HEP   Subjective:   I, Mekai Wilkinson, am serving as a Neurosurgeon for Doctor Morene Mace  Chief Complaint: bilat shoulder pain   HPI:   11/08/2023 Patient is a 68 year old male with bilat shoulder pain. Patient states right shoulder is worse than left. pain that has been going on for a  couple of months. No MOI. Pain radiates to his upper trap. BC powder helps when he takes it. No numbness or tingling. No decrease in grip strength. Decreased ROM . 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 Pain is intermittent    Relevant Historical Information: DM type II, hypertension  Additional pertinent review of systems negative.   Current Outpatient Medications:    meloxicam  (MOBIC ) 15 MG tablet, Take 1 tablet (15 mg total) by mouth daily., Disp: 30 tablet, Rfl: 0   aspirin 81 MG tablet, Take 81 mg by mouth daily., Disp: , Rfl:    atenolol  (TENORMIN ) 50 MG tablet, Take 0.5 tablets (25 mg total) by mouth daily., Disp: 45 tablet, Rfl: 1   atenolol  (TENORMIN ) 50 MG tablet, Take 0.5 tablets (25 mg total) by mouth daily., Disp: 45 tablet, Rfl: 0   hydrochlorothiazide  (HYDRODIURIL ) 25 MG tablet, Take 1 tablet (25 mg total) by mouth daily., Disp: 90 tablet, Rfl: 1   losartan  (COZAAR ) 25 MG tablet, Take 1 tablet (25 mg total) by mouth daily., Disp: 90 tablet, Rfl: 1   losartan  (COZAAR ) 25 MG tablet, Take 1 tablet (25 mg total) by mouth daily., Disp: 90 tablet, Rfl: 0   metFORMIN  (GLUCOPHAGE ) 850 MG tablet, Take 2 tablets (1,700 mg total) by mouth daily with breakfast AND 1 tablet (850 mg total) daily with supper., Disp: 270 tablet, Rfl: 1   ONETOUCH ULTRA test strip, CHECK BLOOD SUGAR NO MORE THAN TWICE DAILY. DX IS E11.9. MAX ON INSURANCE, Disp: 100 strip, Rfl: 12  pioglitazone  (ACTOS ) 45 MG tablet, Take 1 tablet (45 mg total) by mouth daily., Disp: 90 tablet, Rfl: 1   rosuvastatin  (CRESTOR ) 20 MG tablet, Take 1 tablet (20 mg total) by mouth daily., Disp: 90 tablet, Rfl: 1   rosuvastatin  (CRESTOR ) 20 MG tablet, Take 1 tablet (20 mg total) by mouth daily., Disp: 90 tablet, Rfl: 0   tirzepatide  (MOUNJARO ) 12.5 MG/0.5ML Pen, Inject 12.5 mg into the skin once a week., Disp: 2 mL, Rfl: 0   Objective:     Vitals:   11/08/23 0945  Pulse: (!) 53  SpO2: 96%   Weight: 253 lb (114.8 kg)  Height: 6' 3 (1.905 m)      Body mass index is 31.62 kg/m.    Physical Exam:    Gen: Appears well, nad, nontoxic and pleasant Neuro:sensation intact, strength is 5/5 with df/pf/inv/ev, muscle tone wnl Skin: no suspicious lesion or defmority Psych: A&O, appropriate mood and affect  Bilateral shoulder:  No deformity, swelling or muscle wasting No scapular winging FF 180, abd 180, int 0, ext 90 TTP bilateral trapezius NTTP over the Moriarty, clavicle, ac, coracoid, biceps groove, humerus, deltoid, , cervical spine Positive speeds on right.  Equivocal Hawkins bilaterally Neg neer, , empty can, obriens, crossarm, subscap liftoff, left speeds Neg ant drawer, sulcus sign, apprehension Negative Spurling's test bilat FROM of neck    Electronically signed by:  Odis Mace D[facility] 10:15 AM 11/08/23

## 2023-11-08 ENCOUNTER — Other Ambulatory Visit (HOSPITAL_BASED_OUTPATIENT_CLINIC_OR_DEPARTMENT_OTHER): Payer: Self-pay

## 2023-11-08 ENCOUNTER — Ambulatory Visit: Admitting: Sports Medicine

## 2023-11-08 VITALS — HR 53 | Ht 75.0 in | Wt 253.0 lb

## 2023-11-08 DIAGNOSIS — M25511 Pain in right shoulder: Secondary | ICD-10-CM

## 2023-11-08 DIAGNOSIS — G8929 Other chronic pain: Secondary | ICD-10-CM | POA: Diagnosis not present

## 2023-11-08 DIAGNOSIS — M25512 Pain in left shoulder: Secondary | ICD-10-CM

## 2023-11-08 MED ORDER — MELOXICAM 15 MG PO TABS
15.0000 mg | ORAL_TABLET | Freq: Every day | ORAL | 0 refills | Status: DC
  Filled 2023-11-08: qty 30, 30d supply, fill #0

## 2023-11-08 NOTE — Patient Instructions (Signed)
`-   Start meloxicam 15 mg daily x2 weeks.  If still having pain after 2 weeks, complete 3rd-week of NSAID. May use remaining NSAID as needed once daily for pain control.  Do not to use additional over-the-counter NSAIDs (ibuprofen, naproxen, Advil, Aleve, etc.) while taking prescription NSAIDs.  May use Tylenol [phone] mg 2 to 3 times a day for breakthrough pain. Shoulder HEP  4 week follow up

## 2023-11-15 ENCOUNTER — Encounter: Payer: Self-pay | Admitting: Internal Medicine

## 2023-11-15 ENCOUNTER — Other Ambulatory Visit (HOSPITAL_BASED_OUTPATIENT_CLINIC_OR_DEPARTMENT_OTHER): Payer: Self-pay

## 2023-11-15 ENCOUNTER — Other Ambulatory Visit: Payer: Self-pay | Admitting: Family

## 2023-11-15 MED ORDER — NIRMATRELVIR/RITONAVIR (PAXLOVID)TABLET
3.0000 | ORAL_TABLET | Freq: Two times a day (BID) | ORAL | 0 refills | Status: AC
  Filled 2023-11-15: qty 30, 5d supply, fill #0

## 2023-12-03 ENCOUNTER — Encounter: Payer: Self-pay | Admitting: Bariatrics

## 2023-12-03 ENCOUNTER — Ambulatory Visit (INDEPENDENT_AMBULATORY_CARE_PROVIDER_SITE_OTHER): Admitting: Bariatrics

## 2023-12-03 ENCOUNTER — Other Ambulatory Visit (HOSPITAL_BASED_OUTPATIENT_CLINIC_OR_DEPARTMENT_OTHER): Payer: Self-pay

## 2023-12-03 VITALS — BP 117/73 | HR 62 | Temp 97.5°F | Ht 75.0 in | Wt 252.0 lb

## 2023-12-03 DIAGNOSIS — E119 Type 2 diabetes mellitus without complications: Secondary | ICD-10-CM | POA: Diagnosis not present

## 2023-12-03 DIAGNOSIS — E669 Obesity, unspecified: Secondary | ICD-10-CM | POA: Diagnosis not present

## 2023-12-03 DIAGNOSIS — E1159 Type 2 diabetes mellitus with other circulatory complications: Secondary | ICD-10-CM

## 2023-12-03 DIAGNOSIS — Z7985 Long-term (current) use of injectable non-insulin antidiabetic drugs: Secondary | ICD-10-CM

## 2023-12-03 DIAGNOSIS — R0602 Shortness of breath: Secondary | ICD-10-CM

## 2023-12-03 DIAGNOSIS — E6609 Other obesity due to excess calories: Secondary | ICD-10-CM

## 2023-12-03 DIAGNOSIS — Z6831 Body mass index (BMI) 31.0-31.9, adult: Secondary | ICD-10-CM

## 2023-12-03 DIAGNOSIS — R5383 Other fatigue: Secondary | ICD-10-CM

## 2023-12-03 MED ORDER — TIRZEPATIDE 12.5 MG/0.5ML ~~LOC~~ SOAJ
12.5000 mg | SUBCUTANEOUS | 0 refills | Status: DC
  Filled 2023-12-03: qty 2, 28d supply, fill #0

## 2023-12-03 NOTE — Progress Notes (Signed)
 WEIGHT SUMMARY AND BIOMETRICS  Weight Lost Since Last Visit: 4lb  Weight Gained Since Last Visit: 0   Vitals Temp: (!) 97.5 F (36.4 C) BP: 117/73 Pulse Rate: 62 SpO2: 97 %   Anthropometric Measurements Height: 6' 3 (1.905 m) Weight: 252 lb (114.3 kg) BMI (Calculated): 31.5 Weight at Last Visit: 256lb Weight Lost Since Last Visit: 4lb Weight Gained Since Last Visit: 0 Starting Weight: 262lb Total Weight Loss (lbs): 10 lb (4.536 kg)   Body Composition  Body Fat %: 31.8 % Fat Mass (lbs): 80.4 lbs Muscle Mass (lbs): 163.8 lbs Total Body Water (lbs): 122.4 lbs Visceral Fat Rating : 18   Other Clinical Data Fasting: yes Labs: yes Today's Visit #: 15 Starting Date: 08/16/22    OBESITY Finneas is here to discuss his progress with his obesity treatment plan along with follow-up of his obesity related diagnoses.    Nutrition Plan: the Category 4 plan - 80% adherence.  Current exercise: walking and yard work  Interim History:  He is down an additional 4 lbs since his last visit. He has cut back on his food and has been more careful.  Eating all of the food on the plan., Protein intake is as prescribed, Is not skipping meals, Not journaling consistently., and Water intake is adequate.   Pharmacotherapy: Kesley is on Mounjaro  12.5 mg SQ weekly Adverse side effects: None Hunger is moderately controlled.  Cravings are moderately controlled.  Assessment/Plan:   Fatigue and SOB  Had an indirect calorimetry test that showed the following: Actual RMR  2290 kcal  Last RMR was 3,249 on 08/16/2022.  Expected RMR- 2,333 kcal  Actual RMR is < than Expected RMR.       Read the RMR findings and discussed the ramifications of the RMR reading  with the patient and allowed to ask questions. Voiced understanding.    Will change the plan to category 3, and journaling to  1,500 calories and 100 + grams of protein.  Will make the following changes in his plan: Will increase protein and will have a protein shake if he does not get enough protein.  Will increase exercise to the following: will add in resistance training.     Type II Diabetes HgbA1c is at goal. Last A1c was 6.2 Episodes of hypoglycemia: no Medication(s): Mounjaro  12.5 mg SQ weekly  Lab Results  Component Value Date   HGBA1C 6.2 06/24/2023   HGBA1C 6.4 11/02/2022   HGBA1C 7.7 (H) 05/25/2022   Lab Results  Component Value Date   MICROALBUR 0.5 11/05/2008   LDLCALC 64 07/19/2023   CREATININE 0.82 07/19/2023   Lab Results  Component Value Date   GFR 91.96 07/19/2023   GFR 90.04 01/11/2023   GFR 88.95 05/25/2022    Plan: Continue and refill Mounjaro  12.5 mg SQ weekly Continue all other medications.  Will keep all carbohydrates  low both sweets and starches.  Will continue exercise regimen to 30 to 60 minutes on most days of the week.  Aim for 7 to 9 hours of sleep nightly.  Eat more low glycemic index foods.    Generalized Obesity: Current BMI BMI (Calculated): 31.5   Pharmacotherapy Plan Continue and refill  Mounjaro  12.5 mg SQ weekly  Caidon is currently in the action stage of change. As such, his goal is to continue with weight loss efforts.  He has agreed to the Category 4 plan.  Exercise goals: Older adults should follow the adult guidelines. When older adults cannot meet the adult guidelines, they should be as physically active as their abilities and conditions will allow.   Behavioral modification strategies: increasing lean protein intake, no meal skipping, decrease eating out, meal planning , increase water intake, better snacking choices, planning for success, increasing vegetables, and increasing fiber rich foods.  Tsugio has agreed to follow-up with our clinic in 4 weeks.     Objective:   VITALS: Per patient if applicable, see vitals. GENERAL: Alert and in no acute  distress. CARDIOPULMONARY: No increased WOB. Speaking in clear sentences.  PSYCH: Pleasant and cooperative. Speech normal rate and rhythm. Affect is appropriate. Insight and judgement are appropriate. Attention is focused, linear, and appropriate.  NEURO: Oriented as arrived to appointment on time with no prompting.   Attestation Statements:   This was prepared with the assistance of Engineer, civil (consulting).  Occasional wrong-word or sound-a-like substitutions may have occurred due to the inherent limitations of voice recognition   Clayborne Daring, DO

## 2023-12-06 ENCOUNTER — Ambulatory Visit: Admitting: Sports Medicine

## 2023-12-09 ENCOUNTER — Ambulatory Visit: Admitting: Bariatrics

## 2023-12-25 ENCOUNTER — Ambulatory Visit: Admitting: Sports Medicine

## 2023-12-25 ENCOUNTER — Ambulatory Visit

## 2023-12-25 ENCOUNTER — Other Ambulatory Visit (HOSPITAL_BASED_OUTPATIENT_CLINIC_OR_DEPARTMENT_OTHER): Payer: Self-pay

## 2023-12-25 VITALS — BP 128/70 | HR 71 | Ht 75.0 in | Wt 259.0 lb

## 2023-12-25 DIAGNOSIS — M25512 Pain in left shoulder: Secondary | ICD-10-CM

## 2023-12-25 DIAGNOSIS — G8929 Other chronic pain: Secondary | ICD-10-CM | POA: Diagnosis not present

## 2023-12-25 DIAGNOSIS — M25511 Pain in right shoulder: Secondary | ICD-10-CM

## 2023-12-25 MED ORDER — MELOXICAM 15 MG PO TABS
15.0000 mg | ORAL_TABLET | ORAL | 0 refills | Status: AC | PRN
  Filled 2023-12-25: qty 30, 30d supply, fill #0

## 2023-12-25 NOTE — Progress Notes (Signed)
 Ben Rafferty Postlewait D.[facility] [address] [place] [address] Phone: [phone]   Assessment and Plan:     1. Chronic pain of both shoulders (Primary) -Chronic with exacerbation, subsequent visit - Patient had significant improvement in bilateral shoulder pain when using course of meloxicam , however pain returned after completing meloxicam  course.  Most consistent with subacromial bursitis versus biceps tendinitis versus rotator cuff tendinitis, worse on right compared to left - Use meloxicam  15 mg daily as needed for breakthrough pain.  Recommend limiting chronic NSAIDs to 1-2 doses per week to prevent [place]-term side effects. Use Tylenol 500 to 1000 mg tablets 2-3 times a day as needed for day-to-day pain relief.    - Continue HEP for rotator cuff - Patient elected for  subacromial CSI.  Tolerated well per note below.  CSI may temporarily increase blood glucose in patient with past medical history of DM type II  Procedure: Subacromial Injection Side: Bilateral  Risks explained and consent was given verbally. The site was cleaned with alcohol prep. A steroid injection was performed from posterior approach using 2mL of 1% lidocaine without epinephrine and 1mL of kenalog  40mg /ml. This was well tolerated.  Needle was removed, hemostasis achieved, and post injection instructions were explained.  Procedure was repeated on contralateral side.  Pt was advised to call or return to clinic if these symptoms worsen or fail to improve as anticipated.   Pertinent previous records reviewed include none   Follow Up: As needed if no improvement or worsening of symptoms.  Could consider ultrasound versus MRI versus physical therapy versus repeat injections   Subjective:   I, Claretha Schimke am a scribe for Dr. Leonce.     Chief Complaint: bilat shoulder pain    HPI:    11/08/2023 Patient is a 68 year old male with bilat shoulder pain. Patient states right  shoulder is worse than left. pain that has been going on for a couple of months. No MOI. Pain radiates to his upper trap. BC powder helps when he takes it. No numbness or tingling. No decrease in grip strength. Decreased ROM . 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 Pain is intermittent    12/26/2023 Patient states right shoulder is pretty sore. Couldn't pick up his coffee without pain. Left side is hurting just a little.    Relevant Historical Information: DM type II, hypertension  Additional pertinent review of systems negative.   Current Outpatient Medications:    aspirin 81 MG tablet, Take 81 mg by mouth daily., Disp: , Rfl:    atenolol  (TENORMIN ) 50 MG tablet, Take 0.5 tablets (25 mg total) by mouth daily., Disp: 45 tablet, Rfl: 1   atenolol  (TENORMIN ) 50 MG tablet, Take 0.5 tablets (25 mg total) by mouth daily., Disp: 45 tablet, Rfl: 0   hydrochlorothiazide  (HYDRODIURIL ) 25 MG tablet, Take 1 tablet (25 mg total) by mouth daily., Disp: 90 tablet, Rfl: 1   losartan  (COZAAR ) 25 MG tablet, Take 1 tablet (25 mg total) by mouth daily., Disp: 90 tablet, Rfl: 1   losartan  (COZAAR ) 25 MG tablet, Take 1 tablet (25 mg total) by mouth daily., Disp: 90 tablet, Rfl: 0   metFORMIN  (GLUCOPHAGE ) 850 MG tablet, Take 2 tablets (1,700 mg total) by mouth daily with breakfast AND 1 tablet (850 mg total) daily with supper., Disp: 270 tablet, Rfl: 1   ONETOUCH ULTRA test strip, CHECK BLOOD SUGAR NO MORE THAN TWICE DAILY.  DX IS E11.9. MAX ON INSURANCE, Disp: 100 strip, Rfl: 12   pioglitazone  (ACTOS ) 45 MG tablet, Take 1 tablet (45 mg total) by mouth daily., Disp: 90 tablet, Rfl: 1   rosuvastatin  (CRESTOR ) 20 MG tablet, Take 1 tablet (20 mg total) by mouth daily., Disp: 90 tablet, Rfl: 1   rosuvastatin  (CRESTOR ) 20 MG tablet, Take 1 tablet (20 mg total) by mouth daily., Disp: 90 tablet, Rfl: 0   tirzepatide  (MOUNJARO ) 12.5 MG/0.5ML Pen, Inject 12.5 mg into the skin once a week.,  Disp: 2 mL, Rfl: 0   meloxicam  (MOBIC ) 15 MG tablet, Take 1 tablet (15 mg total) by mouth daily as needed for pain., Disp: 30 tablet, Rfl: 0   Objective:     Vitals:   12/25/23 0955  BP: 128/70  Pulse: 71  SpO2: 98%  Weight: 259 lb (117.5 kg)  Height: 6' 3 (1.905 m)      Body mass index is 32.37 kg/m.    Physical Exam:    Gen: Appears well, nad, nontoxic and pleasant Neuro:sensation intact, strength is 5/5 with df/pf/inv/ev, muscle tone wnl Skin: no suspicious lesion or defmority Psych: A&O, appropriate mood and affect   Bilateral shoulder:  No deformity, swelling or muscle wasting No scapular winging FF 180, abd 180, int 0, ext 90 TTP bilateral trapezius NTTP over the [place], clavicle, ac, coracoid, biceps groove, humerus, deltoid, , cervical spine Positive speeds on right.  Equivocal Hawkins bilaterally Neg neer, , empty can, obriens, crossarm, subscap liftoff, left speeds Neg ant drawer, sulcus sign, apprehension Negative Spurling's test bilat FROM of neck    Electronically signed by:  Odis Mace D.[facility] 10:13 AM 12/25/23

## 2023-12-25 NOTE — Patient Instructions (Addendum)
-   Use meloxicam  15 mg daily as needed for breakthrough pain.  Recommend limiting chronic NSAIDs to 1-2 doses per week to prevent [place]-term side effects. Use Tylenol 500 to 1000 mg tablets 2-3 times a day as needed for day-to-day pain relief.    Continue HEP. Follow up as needed.

## 2023-12-30 ENCOUNTER — Ambulatory Visit: Payer: Self-pay | Admitting: Sports Medicine

## 2023-12-31 ENCOUNTER — Ambulatory Visit (INDEPENDENT_AMBULATORY_CARE_PROVIDER_SITE_OTHER): Admitting: Bariatrics

## 2023-12-31 ENCOUNTER — Other Ambulatory Visit (HOSPITAL_BASED_OUTPATIENT_CLINIC_OR_DEPARTMENT_OTHER): Payer: Self-pay

## 2023-12-31 ENCOUNTER — Encounter: Payer: Self-pay | Admitting: Bariatrics

## 2023-12-31 VITALS — BP 107/71 | HR 72 | Temp 98.2°F | Ht 75.0 in | Wt 250.0 lb

## 2023-12-31 DIAGNOSIS — E6609 Other obesity due to excess calories: Secondary | ICD-10-CM

## 2023-12-31 DIAGNOSIS — I10 Essential (primary) hypertension: Secondary | ICD-10-CM | POA: Diagnosis not present

## 2023-12-31 DIAGNOSIS — E669 Obesity, unspecified: Secondary | ICD-10-CM | POA: Diagnosis not present

## 2023-12-31 DIAGNOSIS — E119 Type 2 diabetes mellitus without complications: Secondary | ICD-10-CM

## 2023-12-31 DIAGNOSIS — Z6831 Body mass index (BMI) 31.0-31.9, adult: Secondary | ICD-10-CM | POA: Diagnosis not present

## 2023-12-31 DIAGNOSIS — Z7985 Long-term (current) use of injectable non-insulin antidiabetic drugs: Secondary | ICD-10-CM

## 2023-12-31 DIAGNOSIS — E1159 Type 2 diabetes mellitus with other circulatory complications: Secondary | ICD-10-CM

## 2023-12-31 MED ORDER — TIRZEPATIDE 12.5 MG/0.5ML ~~LOC~~ SOAJ
12.5000 mg | SUBCUTANEOUS | 0 refills | Status: DC
  Filled 2023-12-31: qty 4, 56d supply, fill #0

## 2023-12-31 NOTE — Progress Notes (Signed)
 WEIGHT SUMMARY AND BIOMETRICS  Weight Lost Since Last Visit: 2lb  Weight Gained Since Last Visit: 0   Vitals Temp: 98.2 F (36.8 C) BP: 107/71 Pulse Rate: 72 SpO2: 97 %   Anthropometric Measurements Height: 6' 3 (1.905 m) Weight: 250 lb (113.4 kg) BMI (Calculated): 31.25 Weight at Last Visit: 252lb Weight Lost Since Last Visit: 2lb Weight Gained Since Last Visit: 0 Starting Weight: 262lb Total Weight Loss (lbs): 12 lb (5.443 kg)   Body Composition  Body Fat %: 30.6 % Fat Mass (lbs): 76.6 lbs Muscle Mass (lbs): 165.4 lbs Total Body Water (lbs): 119.8 lbs Visceral Fat Rating : 18   Other Clinical Data Fasting: no Labs: no Today's Visit #: 16 Starting Date: 08/16/22    OBESITY Anthony [name] is here to discuss his progress with his obesity treatment plan along with follow-up of his obesity related diagnoses.    Nutrition Plan: the Category 4 plan - 80% adherence.  Current exercise: yard work  Interim History:  He is down another 2 lbs since his last visit. He is doing yard work.  He is getting about 6 to 7 hours of sleep.  Eating all of the food on the plan., Protein intake is as prescribed, Not journaling consistently., and Water intake is adequate.   Pharmacotherapy: Anthony [name] is on Mounjaro  12.5 mg SQ weekly Adverse side effects: GERD and sulfur burps.  Hunger is well controlled.  Cravings are moderately controlled.  Assessment/Plan:   Type II Diabetes HgbA1c is at goal. Last A1c was 6.2 CBGs: Fasting 120's      Episodes of hypoglycemia: no Medication(s): Mounjaro  12.5 mg SQ weekly  Lab Results  Component Value Date   HGBA1C 6.2 06/24/2023   HGBA1C 6.4 11/02/2022   HGBA1C 7.7 (H) 05/25/2022   Lab Results  Component Value Date   MICROALBUR 0.5 11/05/2008   LDLCALC 64 07/19/2023   CREATININE 0.82 07/19/2023   Lab Results  Component Value  Date   GFR 91.96 07/19/2023   GFR 90.04 01/11/2023   GFR 88.95 05/25/2022    Plan: Continue and refill Mounjaro  12.5 mg SQ weekly 2 month supply with no refills.  Continue all other medications.  Will keep all carbohydrates low both sweets and starches.  Will continue exercise regimen to 30 to 60 minutes on most days of the week.  Aim for 7 to 9 hours of sleep nightly.  Eat more low glycemic index foods.  Will take Papaya enzymes and Tums for sulfa burps and avoid citrus and not eat before bedtime.  Continue exercise and weights.   Hypertension Hypertension well controlled.  Medication(s): Cozaar  25 mg daily and Hydrochlorothiazide  25 mg daily , Atenolol  50 mg.   BP Readings from Last 3 Encounters:  12/31/23 107/71  12/25/23 128/70  12/03/23 117/73   Lab Results  Component Value Date   CREATININE 0.82 07/19/2023  CREATININE 0.89 01/11/2023   CREATININE 0.91 05/25/2022   Lab Results  Component Value Date   GFR 91.96 07/19/2023   GFR 90.04 01/11/2023   GFR 88.95 05/25/2022    Plan: Continue all antihypertensives at current dosages. No added salt. Will keep sodium content to 1,500 mg or less per day.   Will continue exercise.    Generalized Obesity: Current BMI BMI (Calculated): 31.25   Pharmacotherapy Plan Continue and refill  Mounjaro  12.5 mg SQ weekly, 2 month supply with 0 refills.   Anthony [name] is currently in the action stage of change. As such, his goal is to continue with weight loss efforts.  He has agreed to the Category 4 plan.  Exercise goals: Older adults should follow the adult guidelines. When older adults cannot meet the adult guidelines, they should be as physically active as their abilities and conditions will allow.   Behavioral modification strategies: increasing lean protein intake, meal planning , increase water intake, better snacking choices, planning for success, increasing vegetables, decrease snacking , avoiding temptations, and keep healthy  foods in the home.  Anthony [name] has agreed to follow-up with our clinic in 8 weeks.     Objective:   VITALS: Per patient if applicable, see vitals. GENERAL: Alert and in no acute distress. CARDIOPULMONARY: No increased WOB. Speaking in clear sentences.  PSYCH: Pleasant and cooperative. Speech normal rate and rhythm. Affect is appropriate. Insight and judgement are appropriate. Attention is focused, linear, and appropriate.  NEURO: Oriented as arrived to appointment on time with no prompting.   Attestation Statements:   This was prepared with the assistance of Engineer, civil (consulting).  Occasional wrong-word or sound-a-like substitutions may have occurred due to the inherent limitations of voice recognition   Clayborne Daring, DO

## 2024-01-07 ENCOUNTER — Other Ambulatory Visit: Payer: Self-pay | Admitting: Internal Medicine

## 2024-01-07 ENCOUNTER — Other Ambulatory Visit: Payer: Self-pay

## 2024-01-07 ENCOUNTER — Encounter: Payer: Self-pay | Admitting: Internal Medicine

## 2024-01-07 ENCOUNTER — Other Ambulatory Visit (HOSPITAL_BASED_OUTPATIENT_CLINIC_OR_DEPARTMENT_OTHER): Payer: Self-pay

## 2024-01-07 MED ORDER — ATENOLOL 50 MG PO TABS
25.0000 mg | ORAL_TABLET | Freq: Every day | ORAL | 0 refills | Status: DC
  Filled 2024-01-07: qty 45, 90d supply, fill #0

## 2024-01-07 MED ORDER — ROSUVASTATIN CALCIUM 20 MG PO TABS
20.0000 mg | ORAL_TABLET | Freq: Every day | ORAL | 0 refills | Status: DC
  Filled 2024-01-07: qty 90, 90d supply, fill #0

## 2024-01-07 MED ORDER — ONETOUCH ULTRA VI STRP
ORAL_STRIP | 12 refills | Status: AC
  Filled 2024-01-07: qty 100, 50d supply, fill #0
  Filled 2024-03-25: qty 100, 50d supply, fill #1

## 2024-01-31 ENCOUNTER — Ambulatory Visit: Admitting: Internal Medicine

## 2024-02-19 ENCOUNTER — Ambulatory Visit

## 2024-02-19 VITALS — BP 108/74 | HR 82 | Temp 97.6°F | Ht 75.0 in | Wt 265.2 lb

## 2024-02-19 DIAGNOSIS — E66811 Obesity, class 1: Secondary | ICD-10-CM

## 2024-02-19 DIAGNOSIS — G4733 Obstructive sleep apnea (adult) (pediatric): Secondary | ICD-10-CM

## 2024-02-19 DIAGNOSIS — Z6833 Body mass index (BMI) 33.0-33.9, adult: Secondary | ICD-10-CM

## 2024-02-19 NOTE — Addendum Note (Signed)
 Addended by: Roosevelt Bisher M on: 02/19/2024 04:38 PM   Modules accepted: Orders

## 2024-02-19 NOTE — Patient Instructions (Signed)
°  VISIT SUMMARY:  You came in today for your yearly follow-up regarding your severe sleep apnea. You have been using your CPAP machine regularly and have transitioned to a new Airsense 11 machine. You have also experienced significant weight loss while on Mounjaro .  YOUR PLAN:  -OBSTRUCTIVE SLEEP APNEA: Obstructive sleep apnea is a condition where your airway becomes blocked during sleep, causing breathing pauses. Your condition is well-managed with your CPAP machine, and your weight loss with Mounjaro  may further improve it. You should continue with your current CPAP settings. A chin strap has been ordered to help with mouth breathing, and a new CPAP machine has been ordered for your second location. It is advised to clean your CPAP machine with soap and water due to potential risks with the SoClean machine. A repeat sleep study is planned after further weight loss.  INSTRUCTIONS:  Please follow up in one year with Dr. Lorita.                       Contains text generated by Abridge.                                 Contains text generated by Abridge.

## 2024-02-19 NOTE — Addendum Note (Signed)
 Addended by: Kenyetta Wimbish M on: 02/19/2024 04:49 PM   Modules accepted: Orders

## 2024-02-19 NOTE — Progress Notes (Signed)
 Pulmonology Office Visit   Subjective:  Patient ID: Anthony [name], male    DOB: 1955/09/20  MRN: [id number]  Referred by: Amon Aloysius BRAVO, MD  CC:  Chief Complaint  Patient presents with   Consult    Doing well.  Yearly OV.  Would like a new CPAP    HPI Anthony [name] is a 68 y.o. male with hypertension, allergic rhinitis, OSA on CPAP, GERD, DM 2, obesity class I, depression presents for follow-up for OSA. Follows Dr. Lonn 12/07/2022 well compliance.  No changes made.  Respective notes from provider reviewed as appropriate to gather relevant information for patient care.   Discussed the use of AI scribe software for clinical note transcription with the patient, who gave verbal consent to proceed.  History of Present Illness   Anthony [name] is a 68 year old male with severe sleep apnea who presents for his yearly follow-up.  He uses a Swift FX nasal pillow mask with his CPAP machine, which he finds effective despite occasional leaks when he moves his head during sleep. He has transitioned to using the Airsense 11 machine after his previous ResMed S9 machine's fan failed. He uses the machine regularly and cleans his equipment with a SoClean machine, although he avoids connecting it directly to the CPAP machine to prevent warranty issues. He also washes his water tank with soap and water and uses distilled water.  He typically goes to bed around 9:30 PM and wakes up at 4:00 AM. He falls asleep quickly and wakes up once or twice a night to use the bathroom. His wife reports that he does not snore while using the CPAP machine. He has lost 45 pounds while on Mounjaro  and hopes to lose more weight.  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 He drinks coffee regularly, starting at 5:00 AM and finishing by 9:00 AM, with occasional afternoon cups in cold weather. He works as a buyer, retail for [employer] and reports no  significant alcohol use, noting a decreased taste for alcohol since starting Mounjaro .       OSA history: Diagnosed 2013> severe OSA> on CPAP.  Social Hist/Habits:  -Caffeine: several cups/day stop around 9 am   -Alcohol: no   -Nicotine:cigar occasionally. But stopped >40 yrs   -Occupation: drive a development worker, community.   PAP download compliance data: [employer] AirSense 11 Pressure: 5-11 Hours of usage: 6 hours 31-minute Days used >4hr: 30/30 Leak: Intermittently having high leak 95 percentile 30.9 median 4.5. AHI: 1.  PRIOR TESTS and IMAGING: PSG/HSAT: HST 2013: AHI 30, O2 nadir 83, less than 1 minute desaturation.  Weight 267 pound.       10/10/2020    3:00 PM  Results of the Epworth flowsheet  Sitting and reading 0  Watching TV 0  Sitting, inactive in a public place (e.g. a theatre or a meeting) 1  As a passenger in a car for an hour without a break 0  Lying down to rest in the afternoon when circumstances permit 1  Sitting and talking to someone 0  Sitting quietly after a lunch without alcohol 0  In a car, while stopped for a few minutes in traffic 0  Total score 2    Allergies: Patient has no known allergies.  Current Outpatient Medications:    aspirin 81 MG tablet, Take 81 mg by mouth daily., Disp: , Rfl:  atenolol  (TENORMIN ) 50 MG tablet, Take 0.5 tablets (25 mg total) by mouth daily., Disp: 45 tablet, Rfl: 0   glucose blood (ONETOUCH ULTRA) test strip, CHECK BLOOD SUGAR NO MORE THAN TWICE DAILY. DX IS E11.9. MAX ON INSURANCE, Disp: 100 strip, Rfl: 12   hydrochlorothiazide  (HYDRODIURIL ) 25 MG tablet, Take 1 tablet (25 mg total) by mouth daily., Disp: 90 tablet, Rfl: 1   losartan  (COZAAR ) 25 MG tablet, Take 1 tablet (25 mg total) by mouth daily., Disp: 90 tablet, Rfl: 1   meloxicam  (MOBIC ) 15 MG tablet, Take 1 tablet (15 mg total) by mouth daily as needed for pain., Disp: 30 tablet, Rfl: 0   metFORMIN  (GLUCOPHAGE ) 850 MG tablet, Take 2 tablets (1,700 mg total) by  mouth daily with breakfast AND 1 tablet (850 mg total) daily with supper., Disp: 270 tablet, Rfl: 1   pioglitazone  (ACTOS ) 45 MG tablet, Take 1 tablet (45 mg total) by mouth daily., Disp: 90 tablet, Rfl: 1   rosuvastatin  (CRESTOR ) 20 MG tablet, Take 1 tablet (20 mg total) by mouth daily., Disp: 90 tablet, Rfl: 0   tirzepatide  (MOUNJARO ) 12.5 MG/0.5ML Pen, Inject 12.5 mg into the skin once a week., Disp: 4 mL, Rfl: 0 Past Medical History:  Diagnosis Date   Aortic aneurysm    Aortic root enlargement    CT 42 mm 2014   Chronic back pain    in 2022 pt states it ended [place] ago   Diabetes mellitus    type II   DJD (degenerative joint disease)    per MRI of LS spine 2005   GERD (gastroesophageal reflux disease)    Hyperlipidemia    Hypertension    Hypogonadism male    dx w/ hypogonadism elsewhere ~ 2011   Normal cardiac stress test    (-) stress test 2003, 2006   S/P cardiac cath     04-2006 neg per pt--CP improved w/ PPIs   Sleep apnea    uses cpap   Tachycardia    ER 07-2012, ?SCR, on atenolol    Past Surgical History:  Procedure Laterality Date   CARDIAC CATHETERIZATION     CYSTECTOMY     from back   KNEE ARTHROSCOPY WITH MENISCAL REPAIR Right 2021   L hand surgery  04/24/2023   for DJD thumb   MASS EXCISION Left 07/16/2023   L upper arm mass   POLYPECTOMY     Family History  Problem Relation Age of Onset   Heart disease Father        CABG at age 50   Hypertension Father    Colon polyps Brother    Heart disease Brother        CABG   Sinusitis Brother    Prostate cancer Brother 59   Stomach cancer Paternal Uncle    Bladder Cancer Paternal Uncle    Diabetes Other        cousins   Colon cancer Neg Hx    Rectal cancer Neg Hx    Esophageal cancer Neg Hx    Social History   Socioeconomic History   Marital status: Married    Spouse name: Not on file   Number of children: 2   Years of education: Not on file   Highest education level: Not on file  Occupational  History   Occupation: TRUCK DRIVER  Tobacco Use   Smoking status: Former    Types: Cigars    Quit date: 03/12/1981    Years since quitting: 42.9  Smokeless tobacco: Never  Vaping Use   Vaping status: Never Used  Substance and Sexual Activity   Alcohol use: Yes    Alcohol/week: 1.0 standard drink of alcohol    Types: 1 Cans of beer per week    Comment: occasionally less than weekly   Drug use: No   Sexual activity: Not on file  Other Topics Concern   Not on file  Social History Narrative   Truck driver (local); lives w/ wife   Social Drivers of Corporate Investment Banker Strain: Not on file  Food Insecurity: Not on file  Transportation Needs: Not on file  Physical Activity: Not on file  Stress: Not on file  Social Connections: Not on file  Intimate Partner Violence: Not on file       Objective:  BP 108/74   Pulse 82   Temp 97.6 F (36.4 C) (Temporal)   Ht 6' 3 (1.905 m)   Wt 265 lb 3.2 oz (120.3 kg)   SpO2 99% Comment: room air  BMI 33.15 kg/m  BMI Readings from Last 3 Encounters:  02/19/24 33.15 kg/m  12/31/23 31.25 kg/m  12/25/23 32.37 kg/m    Physical Exam: Physical Exam   ENT: Normal mucosa. No hypertrophy of inferior turbinates. Tonsils are normal sized. Modified Mallampati score is normal. PULMONARY: Lungs clear to auscultation bilaterally, no adventitious breath sounds. CARDIOVASCULAR: Regular rate and rhythm, S1 S2 normal, no murmurs. ABDOMEN: Abdomen soft, nontender. Bowel sounds are normal. EXTREMITIES: No peripheral edema noted.       Diagnostic Review:  Last metabolic panel Lab Results  Component Value Date   GLUCOSE 118 (H) 07/19/2023   NA 139 07/19/2023   K 4.0 07/19/2023   CL 104 07/19/2023   CO2 27 07/19/2023   BUN 22 07/19/2023   CREATININE 0.82 07/19/2023   GFR 91.96 07/19/2023   CALCIUM  9.1 07/19/2023   PROT 6.5 07/19/2023   ALBUMIN 4.2 07/19/2023   BILITOT 0.5 07/19/2023   ALKPHOS 63 07/19/2023   AST 18 07/19/2023    ALT 19 07/19/2023         Assessment & Plan:   Assessment & Plan Class 1 obesity     OSA (obstructive sleep apnea)        Assessment and Plan    Obstructive sleep apnea Well-managed with CPAP. Weight loss with Mounjaro  may improve condition. No snoring reported. Non-smoker, no alcohol use. - Continue current CPAP settings. - Ordered chin strap for mouth breathing. - Ordered new CPAP machine. - Advised cleaning CPAP with soap and water if possible. If not use so clean machine.  - Plan repeat sleep study post weight loss per patient request in future. - Follow-up in one year with Dr. Lorita      Notes from Dr Neda 12/07/22 reviewed as to gather relevant information for patient care and formulating plan.   He  was counselled about not driving while drowsy which is common side effect of sleep related disorders.   No follow-ups on file.   I personally spent a total of 30 minutes in the care of the patient today including preparing to see the patient, getting/reviewing separately obtained history, performing a medically appropriate exam/evaluation, counseling and educating, placing orders, documenting clinical information in the EHR, independently interpreting results, and communicating results.   Saraih Lorton, MD

## 2024-02-19 NOTE — Assessment & Plan Note (Addendum)
 SABRA

## 2024-02-20 ENCOUNTER — Other Ambulatory Visit: Payer: Self-pay | Admitting: Thoracic Surgery (Cardiothoracic Vascular Surgery)

## 2024-02-20 DIAGNOSIS — I7121 Aneurysm of the ascending aorta, without rupture: Secondary | ICD-10-CM

## 2024-03-02 ENCOUNTER — Ambulatory Visit (INDEPENDENT_AMBULATORY_CARE_PROVIDER_SITE_OTHER): Admitting: Bariatrics

## 2024-03-02 ENCOUNTER — Encounter: Payer: Self-pay | Admitting: Bariatrics

## 2024-03-02 ENCOUNTER — Other Ambulatory Visit (HOSPITAL_BASED_OUTPATIENT_CLINIC_OR_DEPARTMENT_OTHER): Payer: Self-pay

## 2024-03-02 VITALS — BP 113/73 | HR 82 | Ht 75.0 in | Wt 250.0 lb

## 2024-03-02 DIAGNOSIS — E669 Obesity, unspecified: Secondary | ICD-10-CM

## 2024-03-02 DIAGNOSIS — E1159 Type 2 diabetes mellitus with other circulatory complications: Secondary | ICD-10-CM

## 2024-03-02 DIAGNOSIS — R600 Localized edema: Secondary | ICD-10-CM

## 2024-03-02 DIAGNOSIS — Z7985 Long-term (current) use of injectable non-insulin antidiabetic drugs: Secondary | ICD-10-CM

## 2024-03-02 DIAGNOSIS — E785 Hyperlipidemia, unspecified: Secondary | ICD-10-CM | POA: Diagnosis not present

## 2024-03-02 DIAGNOSIS — Z6831 Body mass index (BMI) 31.0-31.9, adult: Secondary | ICD-10-CM

## 2024-03-02 DIAGNOSIS — E119 Type 2 diabetes mellitus without complications: Secondary | ICD-10-CM

## 2024-03-02 MED ORDER — TIRZEPATIDE 12.5 MG/0.5ML ~~LOC~~ SOAJ
12.5000 mg | SUBCUTANEOUS | 0 refills | Status: AC
  Filled 2024-03-02: qty 4, 56d supply, fill #0

## 2024-03-02 MED ORDER — FUROSEMIDE 20 MG PO TABS
20.0000 mg | ORAL_TABLET | ORAL | 0 refills | Status: AC
  Filled 2024-03-02 (×2): qty 30, 90d supply, fill #0

## 2024-03-02 NOTE — Progress Notes (Unsigned)
 "                                                                                                             WEIGHT SUMMARY AND BIOMETRICS  Weight Lost Since Last Visit: 0lb  Weight Gained Since Last Visit: 0   Vitals BP: 113/73 Pulse Rate: 82 SpO2: 98 %   Anthropometric Measurements Height: 6' 3 (1.905 m) Weight: 250 lb (113.4 kg) BMI (Calculated): 31.25 Weight at Last Visit: 250lb Weight Lost Since Last Visit: 0lb Weight Gained Since Last Visit: 0 Starting Weight: 262lb Total Weight Loss (lbs): 9 lb (4.082 kg)   Body Composition  Body Fat %: 32.5 % Fat Mass (lbs): 82.4 lbs Muscle Mass (lbs): 162.6 lbs Total Body Water (lbs): 122.4 lbs Visceral Fat Rating : 19   Other Clinical Data Fasting: no Labs: no Today's Visit #: 17 Starting Date: 08/16/22    OBESITY Duan is here to discuss his progress with his obesity treatment plan along with follow-up of his obesity related diagnoses.    Nutrition Plan: the Category 4 plan - 75% adherence.  Current exercise: walking  Interim History:  He is up 3 lbs since his last visit.  {aabnutritionassessment:29213}   Pharmacotherapy: Merrit is on {dwwpharmacotherapy:29109} Adverse side effects: {dwwse:29122} Hunger is {EWCONTROLASSESSMENT:24261}.  Cravings are {EWCONTROLASSESSMENT:24261}.  Assessment/Plan:   Type II Diabetes HgbA1c {ACTION; IS/IS NOT:21021397} at goal. Last A1c was *** CBGs: {dwwcbg:29160}      Episodes of hypoglycemia: {yes***/no:17258} Medication(s): {dwwpharmacotherapy:29109}  Lab Results  Component Value Date   HGBA1C 6.2 06/24/2023   HGBA1C 6.4 11/02/2022   HGBA1C 7.7 (H) 05/25/2022   Lab Results  Component Value Date   MICROALBUR 0.5 11/05/2008   LDLCALC 64 07/19/2023   CREATININE 0.82 07/19/2023   Lab Results  Component Value Date   GFR 91.96 07/19/2023   GFR 90.04 01/11/2023   GFR 88.95 05/25/2022    Plan: {dwwmed:29123} {dwwpharmacotherapy:29109} Continue all other  medications.  Will keep all carbohydrates low both sweets and starches.  Will continue exercise regimen to 30 to 60 minutes on most days of the week.  Aim for 7 to 9 hours of sleep nightly.  Eat more low glycemic index foods.    Hyperlipidemia LDL is at goal. Medication(s): Crestor  Cardiovascular risk factors: advanced age (older than 12 for men, 15 for women), diabetes mellitus, dyslipidemia, hypertension, male gender, obesity (BMI >= 30 kg/m2), and sedentary lifestyle  Lab Results  Component Value Date   CHOL 127 07/19/2023   HDL 41.70 07/19/2023   LDLCALC 64 07/19/2023   TRIG 107.0 07/19/2023   CHOLHDL 3 07/19/2023   Lab Results  Component Value Date   ALT 19 07/19/2023   AST 18 07/19/2023   ALKPHOS 63 07/19/2023   BILITOT 0.5 07/19/2023   The ASCVD Risk score (Arnett DK, et al., 2019) failed to calculate for the following reasons:   The valid total cholesterol range is 130 to 320 mg/dL  Plan:  Continue statin.  Information sheet on healthy vs unhealthy fats.  Will avoid all  trans fats.  Will read labels Will minimize saturated fats except the following: low fat meats in moderation, diary, and limited dark chocolate.       Generalized Obesity: Current BMI BMI (Calculated): 31.25   Pharmacotherapy Plan {dwwmed:29123}  {dwwpharmacotherapy:29109}  Corvin {CHL AMB IS/IS NOT:210130109} currently in the action stage of change. As such, his goal is to {MWMwtloss#1:210800005}.  He has agreed to {dwwsldiets:29085}.  Exercise goals: {MWM EXERCISE RECS:23473}  Behavioral modification strategies: {dwwslwtlossstrategies:29088}.  Sarah has agreed to follow-up with our clinic in {NUMBER 1-10:22536} weeks.    Objective:   VITALS: Per patient if applicable, see vitals. GENERAL: Alert and in no acute distress. CARDIOPULMONARY: No increased WOB. Speaking in clear sentences.  PSYCH: Pleasant and cooperative. Speech normal rate and rhythm. Affect is appropriate. Insight and  judgement are appropriate. Attention is focused, linear, and appropriate.  NEURO: Oriented as arrived to appointment on time with no prompting.   Attestation Statements:   This was prepared with the assistance of Engineer, Civil (consulting).  Occasional wrong-word or sound-a-like substitutions may have occurred due to the inherent limitations of voice recognition.   Clayborne Daring, DO    "

## 2024-03-03 ENCOUNTER — Other Ambulatory Visit (HOSPITAL_BASED_OUTPATIENT_CLINIC_OR_DEPARTMENT_OTHER): Payer: Self-pay

## 2024-03-20 ENCOUNTER — Ambulatory Visit (HOSPITAL_COMMUNITY)
Admission: RE | Admit: 2024-03-20 | Discharge: 2024-03-20 | Disposition: A | Payer: Self-pay | Source: Ambulatory Visit | Attending: Cardiology | Admitting: Cardiology

## 2024-03-20 ENCOUNTER — Ambulatory Visit: Payer: Self-pay | Admitting: Cardiology

## 2024-03-20 DIAGNOSIS — E1159 Type 2 diabetes mellitus with other circulatory complications: Secondary | ICD-10-CM | POA: Insufficient documentation

## 2024-03-20 DIAGNOSIS — I7121 Aneurysm of the ascending aorta, without rupture: Secondary | ICD-10-CM | POA: Insufficient documentation

## 2024-03-20 LAB — POCT I-STAT CREATININE: Creatinine, Ser: 1 mg/dL (ref 0.61–1.24)

## 2024-03-20 MED ORDER — IOHEXOL 350 MG/ML SOLN
75.0000 mL | Freq: Once | INTRAVENOUS | Status: AC | PRN
  Administered 2024-03-20: 75 mL via INTRAVENOUS

## 2024-03-25 ENCOUNTER — Other Ambulatory Visit: Payer: Self-pay | Admitting: Internal Medicine

## 2024-03-25 ENCOUNTER — Other Ambulatory Visit (HOSPITAL_BASED_OUTPATIENT_CLINIC_OR_DEPARTMENT_OTHER): Payer: Self-pay

## 2024-03-25 MED ORDER — ROSUVASTATIN CALCIUM 20 MG PO TABS
20.0000 mg | ORAL_TABLET | Freq: Every day | ORAL | 0 refills | Status: AC
  Filled 2024-03-25: qty 90, 90d supply, fill #0

## 2024-03-25 MED ORDER — HYDROCHLOROTHIAZIDE 25 MG PO TABS
25.0000 mg | ORAL_TABLET | Freq: Every day | ORAL | 1 refills | Status: AC
  Filled 2024-03-25: qty 90, 90d supply, fill #0

## 2024-03-25 MED ORDER — LOSARTAN POTASSIUM 25 MG PO TABS
25.0000 mg | ORAL_TABLET | Freq: Every day | ORAL | 1 refills | Status: AC
  Filled 2024-03-25: qty 90, 90d supply, fill #0

## 2024-03-25 MED ORDER — ATENOLOL 50 MG PO TABS
25.0000 mg | ORAL_TABLET | Freq: Every day | ORAL | 1 refills | Status: AC
  Filled 2024-03-25: qty 45, 90d supply, fill #0

## 2024-04-07 ENCOUNTER — Ambulatory Visit: Payer: Self-pay

## 2024-04-07 ENCOUNTER — Ambulatory Visit

## 2024-04-14 ENCOUNTER — Ambulatory Visit: Payer: Self-pay

## 2024-04-14 VITALS — BP 122/74 | HR 79 | Resp 20 | Ht 75.0 in | Wt 250.0 lb

## 2024-04-14 DIAGNOSIS — I7121 Aneurysm of the ascending aorta, without rupture: Secondary | ICD-10-CM | POA: Diagnosis not present

## 2024-04-16 NOTE — Patient Instructions (Signed)

## 2024-05-05 ENCOUNTER — Ambulatory Visit: Admitting: Bariatrics

## 2024-07-21 ENCOUNTER — Encounter: Admitting: Internal Medicine
